# Patient Record
Sex: Male | Born: 1935 | Race: White | Hispanic: No | Smoking: Former smoker
Health system: Southern US, Community
[De-identification: ages and names within clinical notes are randomized; demographics above are authoritative.]

## PROBLEM LIST (undated history)

## (undated) DIAGNOSIS — M1990 Unspecified osteoarthritis, unspecified site: Secondary | ICD-10-CM

## (undated) DIAGNOSIS — N390 Urinary tract infection, site not specified: Secondary | ICD-10-CM

## (undated) DIAGNOSIS — M545 Low back pain, unspecified: Secondary | ICD-10-CM

## (undated) DIAGNOSIS — Z9289 Personal history of other medical treatment: Secondary | ICD-10-CM

## (undated) DIAGNOSIS — I10 Essential (primary) hypertension: Secondary | ICD-10-CM

## (undated) DIAGNOSIS — E7800 Pure hypercholesterolemia, unspecified: Secondary | ICD-10-CM

## (undated) DIAGNOSIS — I499 Cardiac arrhythmia, unspecified: Secondary | ICD-10-CM

## (undated) DIAGNOSIS — I350 Nonrheumatic aortic (valve) stenosis: Secondary | ICD-10-CM

## (undated) DIAGNOSIS — J449 Chronic obstructive pulmonary disease, unspecified: Secondary | ICD-10-CM

## (undated) DIAGNOSIS — I6529 Occlusion and stenosis of unspecified carotid artery: Secondary | ICD-10-CM

## (undated) DIAGNOSIS — E119 Type 2 diabetes mellitus without complications: Secondary | ICD-10-CM

## (undated) DIAGNOSIS — I5033 Acute on chronic diastolic (congestive) heart failure: Secondary | ICD-10-CM

## (undated) DIAGNOSIS — I482 Chronic atrial fibrillation, unspecified: Secondary | ICD-10-CM

## (undated) DIAGNOSIS — M549 Dorsalgia, unspecified: Secondary | ICD-10-CM

## (undated) DIAGNOSIS — I639 Cerebral infarction, unspecified: Secondary | ICD-10-CM

## (undated) DIAGNOSIS — G8929 Other chronic pain: Secondary | ICD-10-CM

## (undated) DIAGNOSIS — G459 Transient cerebral ischemic attack, unspecified: Secondary | ICD-10-CM

## (undated) DIAGNOSIS — F419 Anxiety disorder, unspecified: Secondary | ICD-10-CM

## (undated) DIAGNOSIS — J189 Pneumonia, unspecified organism: Secondary | ICD-10-CM

## (undated) HISTORY — DX: Personal history of other medical treatment: Z92.89

## (undated) HISTORY — PX: KNEE ARTHROSCOPY: SHX127

## (undated) HISTORY — PX: TOTAL HIP ARTHROPLASTY: SHX124

## (undated) HISTORY — PX: EYE SURGERY: SHX253

## (undated) HISTORY — DX: Pure hypercholesterolemia, unspecified: E78.00

## (undated) HISTORY — PX: TONSILLECTOMY: SUR1361

## (undated) HISTORY — DX: Nonrheumatic aortic (valve) stenosis: I35.0

## (undated) HISTORY — PX: APPENDECTOMY: SHX54

## (undated) HISTORY — DX: Other chronic pain: G89.29

## (undated) HISTORY — DX: Dorsalgia, unspecified: M54.9

## (undated) HISTORY — PX: LUMBAR LAMINECTOMY/DECOMPRESSION MICRODISCECTOMY: SHX5026

## (undated) HISTORY — DX: Unspecified osteoarthritis, unspecified site: M19.90

## (undated) HISTORY — DX: Cerebral infarction, unspecified: I63.9

## (undated) HISTORY — PX: CATARACT EXTRACTION W/ INTRAOCULAR LENS  IMPLANT, BILATERAL: SHX1307

## (undated) HISTORY — PX: REPLACEMENT TOTAL KNEE: SUR1224

## (undated) HISTORY — DX: Essential (primary) hypertension: I10

## (undated) HISTORY — PX: BACK SURGERY: SHX140

## (undated) HISTORY — PX: JOINT REPLACEMENT: SHX530

---

## 2000-06-29 ENCOUNTER — Encounter: Admission: RE | Admit: 2000-06-29 | Discharge: 2000-06-29 | Payer: Self-pay | Admitting: Orthopaedic Surgery

## 2000-06-29 ENCOUNTER — Encounter: Payer: Self-pay | Admitting: Orthopaedic Surgery

## 2000-07-03 ENCOUNTER — Ambulatory Visit (HOSPITAL_BASED_OUTPATIENT_CLINIC_OR_DEPARTMENT_OTHER): Admission: RE | Admit: 2000-07-03 | Discharge: 2000-07-03 | Payer: Self-pay | Admitting: Orthopaedic Surgery

## 2005-01-16 ENCOUNTER — Encounter: Admission: RE | Admit: 2005-01-16 | Discharge: 2005-01-16 | Payer: Self-pay | Admitting: Internal Medicine

## 2005-02-20 ENCOUNTER — Encounter: Admission: RE | Admit: 2005-02-20 | Discharge: 2005-02-20 | Payer: Self-pay | Admitting: Internal Medicine

## 2007-06-14 DIAGNOSIS — G459 Transient cerebral ischemic attack, unspecified: Secondary | ICD-10-CM

## 2007-06-14 HISTORY — DX: Transient cerebral ischemic attack, unspecified: G45.9

## 2007-06-15 ENCOUNTER — Inpatient Hospital Stay (HOSPITAL_COMMUNITY): Admission: EM | Admit: 2007-06-15 | Discharge: 2007-06-18 | Payer: Self-pay | Admitting: Emergency Medicine

## 2007-06-18 ENCOUNTER — Encounter (INDEPENDENT_AMBULATORY_CARE_PROVIDER_SITE_OTHER): Payer: Self-pay | Admitting: Interventional Cardiology

## 2007-09-02 ENCOUNTER — Inpatient Hospital Stay (HOSPITAL_COMMUNITY): Admission: RE | Admit: 2007-09-02 | Discharge: 2007-09-03 | Payer: Self-pay | Admitting: Neurosurgery

## 2008-06-26 ENCOUNTER — Encounter: Admission: RE | Admit: 2008-06-26 | Discharge: 2008-06-26 | Payer: Self-pay | Admitting: Geriatric Medicine

## 2010-04-13 ENCOUNTER — Ambulatory Visit (INDEPENDENT_AMBULATORY_CARE_PROVIDER_SITE_OTHER): Payer: Medicare Other | Admitting: Cardiovascular Disease

## 2010-04-13 ENCOUNTER — Encounter: Payer: Self-pay | Admitting: Cardiovascular Disease

## 2010-04-13 DIAGNOSIS — I4891 Unspecified atrial fibrillation: Secondary | ICD-10-CM

## 2010-04-13 DIAGNOSIS — I4821 Permanent atrial fibrillation: Secondary | ICD-10-CM | POA: Insufficient documentation

## 2010-04-26 ENCOUNTER — Telehealth (HOSPITAL_COMMUNITY): Payer: Self-pay | Admitting: Radiology

## 2010-04-27 ENCOUNTER — Encounter: Payer: Self-pay | Admitting: Cardiovascular Disease

## 2010-04-27 ENCOUNTER — Ambulatory Visit (HOSPITAL_COMMUNITY): Payer: Medicare Other | Attending: Cardiovascular Disease

## 2010-04-27 ENCOUNTER — Ambulatory Visit (HOSPITAL_BASED_OUTPATIENT_CLINIC_OR_DEPARTMENT_OTHER): Payer: Medicare Other

## 2010-04-27 ENCOUNTER — Encounter: Payer: Self-pay | Admitting: Cardiology

## 2010-04-27 ENCOUNTER — Other Ambulatory Visit (INDEPENDENT_AMBULATORY_CARE_PROVIDER_SITE_OTHER): Payer: Medicare Other

## 2010-04-27 ENCOUNTER — Other Ambulatory Visit: Payer: Self-pay | Admitting: Cardiovascular Disease

## 2010-04-27 ENCOUNTER — Encounter (INDEPENDENT_AMBULATORY_CARE_PROVIDER_SITE_OTHER): Payer: Self-pay | Admitting: *Deleted

## 2010-04-27 ENCOUNTER — Encounter: Payer: Self-pay | Admitting: *Deleted

## 2010-04-27 DIAGNOSIS — I4891 Unspecified atrial fibrillation: Secondary | ICD-10-CM

## 2010-04-27 DIAGNOSIS — R0989 Other specified symptoms and signs involving the circulatory and respiratory systems: Secondary | ICD-10-CM

## 2010-04-27 DIAGNOSIS — R0609 Other forms of dyspnea: Secondary | ICD-10-CM

## 2010-04-27 DIAGNOSIS — R9431 Abnormal electrocardiogram [ECG] [EKG]: Secondary | ICD-10-CM

## 2010-04-27 LAB — CBC WITH DIFFERENTIAL/PLATELET
Basophils Absolute: 0 10*3/uL (ref 0.0–0.1)
Eosinophils Absolute: 0.1 10*3/uL (ref 0.0–0.7)
Hemoglobin: 15 g/dL (ref 13.0–17.0)
Lymphocytes Relative: 24.7 % (ref 12.0–46.0)
MCHC: 34.3 g/dL (ref 30.0–36.0)
MCV: 90 fl (ref 78.0–100.0)
Monocytes Absolute: 0.5 10*3/uL (ref 0.1–1.0)
Neutro Abs: 4 10*3/uL (ref 1.4–7.7)
RDW: 13.8 % (ref 11.5–14.6)

## 2010-05-03 NOTE — Assessment & Plan Note (Signed)
Summary: SOB   Visit Type:  Initial Consult Referring Provider:  Dr. Earl Gala Primary Provider:  Theressa Millard, M.D.  CC:  new patient referred from Dr. Earl Gala.  History of Present Illness: 77 year-old male presents for initial evaluation of exertional dyspnea and palpitations.    The patient reports progressive shortness of breath over 6-8 months. He normally enjoys walking in the woods, but has been unable to do that recently because of dysnpea. He denies chest pain or tightness. No edema, orthopnea, or PND.   He has also noticed irregular heartbeats of late. He does not have palpitations but has noticed this when checking his pulse. He denies lightheadedness or syncope.  He was noted to have atrial fibrillation when recently seen by Dr. Earl Gala and was referred here for further evaluation.  He has no past history of cardiac disease.    Current Medications (verified): 1)  Plavix 75 Mg Tabs (Clopidogrel Bisulfate) .... Take One Tablet Once Daily 2)  Glimepiride 2 Mg Tabs (Glimepiride) .... One Tablet Once Daily 3)  Lisinopril 20 Mg Tabs (Lisinopril) .... Take One Tablet Once Daily 4)  Metformin Hcl 500 Mg Tabs (Metformin Hcl) .... Take 2 Tablets in The Morning and 2 Tablets in The Evening 5)  Pravastatin Sodium 40 Mg Tabs (Pravastatin Sodium) .... Take One Tablet Once Daily 6)  Cinnamon Oil  Oil (Cassia Oil) .... Take A Total of 4000 Units Daily  Allergies (verified): No Known Drug Allergies  Past History:  Past medical, surgical, family and social histories (including risk factors) reviewed, and no changes noted (except as noted below).  Past Medical History: Hypertension Hypercholesterolemia CVA Osteoarthritis DM type 2 Chronic back pain  Past Surgical History: Reviewed history from 04/12/2010 and no changes required.  Decompressive laminectomy  Left knee surgery  Family History: Reviewed history from 04/12/2010 and no changes required.  No coronary artery  disease.    Mother 69 Father died at 21 No CAD in family  Social History: Reviewed history from 04/12/2010 and no changes required. He does not smoke, drink, or use any drugs He is retired from work in [employer]  Review of Systems       Negative except as per HPI   Vital Signs:  Patient profile:   77 year old male Height:      73 inches Weight:      231 pounds BMI:     30.59 Pulse rate:   76 / minute Pulse rhythm:   irregular Resp:     18 per minute BP sitting:   142 / 86  (left arm) Cuff size:   regular  Vitals Entered By: Judithe Modest CMA (April 13, 2010 2:19 PM)  Physical Exam  General:  Pt is well-developed, alert and oriented, no acute distress HEENT: normal Neck: no thyromegaly           JVP normal, carotid upstrokes normal without bruits Lungs: CTA Chest: equal expansion  CV: Apical impulse nondisplaced, irregularly irregular without murmur or gallop Abd: soft, NT, positive BS, no HSM, no bruit Back: no CVA tenderness Ext: no clubbing, cyanosis, or edema        femoral pulses 2+ without bruits        pedal pulses 2+ and equal Skin: warm, dry, no rash Neuro: CNII-XII intact,strength 5/5 = b/l    EKG  Procedure date:  04/13/2010  Findings:      Atrial fibrillation 76 beats per minute, left anterior fascicular block, otherwise within normal limits.  Impression & Recommendations:  Problem # 1:  ATRIAL FIBRILLATION (ICD-427.31) The patient has newly diagnosed atrial fibrillation. His heart rate is controlled. He does have progressive shortness of breath and this may be related to his atrial fib. I have recommended a 2-D echocardiogram as well as a exercise Myoview stress test to rule out significant ischemic heart disease. I reviewed the patient's old records and he did have a transesophageal echocardiogram in 2009 at the time of a TIA. This demonstrated severe aortic atheroma and a mildly calcified aortic valve. There otherwise was no  significant structural heart disease.  The patient clearly warrants anticoagulation as his chads score is 5 (history TIA, hypertension, age, and diabetes).  We reviewed anticoagulants including warfarin and pradaxa and have elected a trial of pradaxa. He should discontinue plavix since we are starting systemic anticoagulation.  I would like to see him back in followup after his echocardiogram and stress test are completed.   His updated medication list for this problem includes:    Plavix 75 Mg Tabs (Clopidogrel bisulfate) .Marland Kitchen... Take one tablet once daily  Orders: EKG w/ Interpretation (93000) Echocardiogram (Echo) Nuclear Stress Test (Nuc Stress Test)  Patient Instructions: 1)  Your physician recommends that you schedule a follow-up appointment in: APRIL 2)  Your physician has recommended you make the following change in your medication: START Pradaxa 150mg  take one by mouth two times a day  3)  Your physician has requested that you have an echocardiogram.  Echocardiography is a painless test that uses sound waves to create images of your heart. It provides your doctor with information about the size and shape of your heart and how well your heart's chambers and valves are working.  This procedure takes approximately one hour. There are no restrictions for this procedure. 4)  Your physician has requested that you have an exercise stress myoview.  For further information please visit [email or web address]  Please follow instruction sheet, as given. 5)  Your physician recommends that you return for lab work in: 10 Days (CBC 427.31) Prescriptions: PRADAXA 150 MG CAPS (DABIGATRAN ETEXILATE MESYLATE) take one by mouth two times a day  #60 x 6   Entered by:   Julieta Gutting, RN, BSN   Authorized by:   Norva Karvonen, MD   Signed by:   Julieta Gutting, RN, BSN on 04/13/2010   Method used:   Electronically to        [id number] [phone]* (retail)       [address]        [place], [place]  [address]       Ph: [phone] or [phone]       Fax: [phone]   RxID:   [id number]   Appended Document: SOB [name] - I was reviewing his note and he should stop plavix now that we are starting him on pradaxa. (Per Dr Excell Seltzer)  Pt made aware of these instructions today during office testing.   Clinical Lists Changes  Medications: Removed medication of PLAVIX 75 MG TABS (CLOPIDOGREL BISULFATE) take one tablet once daily

## 2010-05-03 NOTE — Progress Notes (Signed)
Summary: Nuclear Pre-Procedure  Phone Note Outgoing Call Call back at [facility] Phone [phone]   Call placed by: Stanton Kidney, EMT-P,  April 26, 2010 2:30 PM Call placed to: Patient Action Taken: Phone Call Completed Reason for Call: Confirm/change Appt Summary of Call: Reviewed information on Myoview Information Sheet (see scanned document for further details).  Spoke with the patient. Stanton Kidney, EMT-P  April 26, 2010 2:30 PM      Nuclear Med Background Indications for Stress Test: Evaluation for Ischemia   History: Abnormal EKG, Echo  History Comments: LAFB; "09 Echo / TEE severe aortic atheroma; A-Fib- new diag.  Symptoms: DOE, Palpitations, SOB    Nuclear Pre-Procedure Cardiac Risk Factors: CVA, Hypertension, Lipids, NIDDM, TIA Height (in): 73

## 2010-05-12 ENCOUNTER — Encounter: Payer: Self-pay | Admitting: Cardiovascular Disease

## 2010-05-12 NOTE — Assessment & Plan Note (Signed)
Summary: Cardiology Nuclear Testing  Nuclear Med Background Indications for Stress Test: Evaluation for Ischemia   History: Abnormal EKG, Echo  History Comments: LAFB; "09 Echo / TEE severe aortic atheroma; A-Fib- new diag.  Symptoms: DOE, Palpitations, SOB    Nuclear Pre-Procedure Cardiac Risk Factors: CVA, Hypertension, Lipids, NIDDM, TIA Caffeine/Decaff Intake: None NPO After: 6:30 PM Lungs: clear IV 0.9% NS with Angio Cath: 20g     IV Site: R Antecubital IV Started by: Stanton Kidney, EMT-P Chest Size (in) 44     Height (in): 72 Weight (lb): 228 BMI: 31.03  Nuclear Med Study 1 or 2 day study:  1 day     Stress Test Type:  Eugenie Birks Reading MD:  Cassell Clement, MD     Referring MD:  M.Cooper Resting Radionuclide:  Technetium 53m Tetrofosmin     Resting Radionuclide Dose:  11 mCi  Stress Radionuclide:  Technetium 35m Tetrofosmin     Stress Radionuclide Dose:  33 mCi   Stress Protocol  Max Systolic BP: 159 mm Hg Lexiscan: 0.4 mg   Stress Test Technologist:  Milana Na, EMT-P     Nuclear Technologist:  Doyne Keel, CNMT  Rest Procedure  Myocardial perfusion imaging was performed at rest 45 minutes following the intravenous administration of Technetium 22m Tetrofosmin.  Stress Procedure  The patient received IV Lexiscan 0.4 mg over 15-seconds.  Technetium 27m Tetrofosmin injected at 30-seconds.  There were no significant changes with infusion.  Quantitative spect images were obtained after a 45 minute delay.  QPS Raw Data Images:  Normal; no motion artifact; normal heart/lung ratio. Stress Images:  Normal homogeneous uptake in all areas of the myocardium. Rest Images:  Normal homogeneous uptake in all areas of the myocardium. Subtraction (SDS):  Normal Transient Ischemic Dilatation:  1.13  (Normal <1.22)  Lung/Heart Ratio:  0.38  (Normal <0.45)  Quantitative Gated Spect Images QGS cine images:  non-gated study  Findings Low risk nuclear  study      Overall Impression  Exercise Capacity: Lexiscan with no exercise. BP Response: Normal blood pressure response. Clinical Symptoms: No chest pain ECG Impression: No significant ST segment change suggestive of ischemia.  Atrial fibrillation. Overall Impression: Low risk stress nuclear study.  Non-gated study because of arrhythmia  Appended Document: Cardiology Nuclear Testing no ischemia. no further ischemic eval needed.  Appended Document: Cardiology Nuclear Testing Pt aware of results by phone.

## 2010-05-16 ENCOUNTER — Telehealth: Payer: Self-pay | Admitting: Cardiovascular Disease

## 2010-05-16 DIAGNOSIS — G459 Transient cerebral ischemic attack, unspecified: Secondary | ICD-10-CM

## 2010-05-16 DIAGNOSIS — I4891 Unspecified atrial fibrillation: Secondary | ICD-10-CM

## 2010-05-16 NOTE — Telephone Encounter (Signed)
Left message for pt to call the office in regards to Pradaxa.

## 2010-05-16 NOTE — Telephone Encounter (Signed)
I spoke with the pt and he said he had a "mini-stroke" on Friday.  The pt self diagnosed himself and did not seek medical treatment.  The pt said he has had 2 mini-strokes in the past and this was a similar episode.  The symptoms lasted a few minutes and he could not put words together. The pt's plavix was stopped after the pt was started on pradaxa. The pt is questioning if he should restart Plavix.  The pt has not missed any doses of Pradaxa. I will speak with Dr Excell Seltzer about the pt's concerns.  Cell [phone]

## 2010-05-16 NOTE — Telephone Encounter (Signed)
Pt has question re his plavix.

## 2010-05-17 ENCOUNTER — Other Ambulatory Visit: Payer: Self-pay | Admitting: *Deleted

## 2010-05-17 MED ORDER — DABIGATRAN ETEXILATE MESYLATE 150 MG PO CAPS: 150.0000 mg | ORAL_CAPSULE | Freq: Two times a day (BID) | ORAL | Status: DC

## 2010-05-17 NOTE — Telephone Encounter (Signed)
I left a message on the patient's voicemail to start back on aspirin 81 mg daily. He should continue with Pradaxa. I asked him to call our office back tomorrow for further instructions. We would like to check a carotid duplex exam to rule out significant carotid stenosis.

## 2010-05-18 NOTE — Telephone Encounter (Signed)
Left message for pt to call back.  24 days worth of Pradaxa samples placed at the front desk for pick-up.  The pt has been scheduled for carotid duplex on 06/03/10 at 11:30.

## 2010-05-18 NOTE — Telephone Encounter (Signed)
I spoke with the patient this morning. He had a transient episode of aphasia last week and is feeling fine now. I've asked him to continue pradaxa and to start aspirin 81 mg daily. He has just undergone an echocardiogram and stress test. Will add a carotid duplex scan to rule out severe carotid stenosis. I think the most likely etiology of his TIA is cardioembolic from atrial fibrillation. The patient only has 5 pradaxa pills left.  We will call in a prescription to his local pharmacy and see if we can get him some samples as well.

## 2010-05-18 NOTE — Telephone Encounter (Signed)
Please call pt back at [phone]

## 2010-05-18 NOTE — Telephone Encounter (Signed)
Pt aware of samples and carotid duplex appointment.

## 2010-06-02 ENCOUNTER — Ambulatory Visit: Payer: Medicare Other | Admitting: Cardiovascular Disease

## 2010-06-03 ENCOUNTER — Encounter: Payer: Self-pay | Admitting: Cardiovascular Disease

## 2010-06-03 ENCOUNTER — Encounter (INDEPENDENT_AMBULATORY_CARE_PROVIDER_SITE_OTHER): Payer: Medicare Other | Admitting: *Deleted

## 2010-06-03 ENCOUNTER — Other Ambulatory Visit: Payer: Self-pay | Admitting: *Deleted

## 2010-06-03 DIAGNOSIS — G459 Transient cerebral ischemic attack, unspecified: Secondary | ICD-10-CM

## 2010-06-03 DIAGNOSIS — I6529 Occlusion and stenosis of unspecified carotid artery: Secondary | ICD-10-CM

## 2010-06-03 DIAGNOSIS — I4891 Unspecified atrial fibrillation: Secondary | ICD-10-CM

## 2010-06-07 ENCOUNTER — Ambulatory Visit (INDEPENDENT_AMBULATORY_CARE_PROVIDER_SITE_OTHER): Payer: Medicare Other | Admitting: Cardiovascular Disease

## 2010-06-07 ENCOUNTER — Encounter: Payer: Self-pay | Admitting: Cardiovascular Disease

## 2010-06-07 VITALS — BP 144/86 | HR 87 | Ht 73.0 in | Wt 232.1 lb

## 2010-06-07 DIAGNOSIS — I4891 Unspecified atrial fibrillation: Secondary | ICD-10-CM

## 2010-06-07 DIAGNOSIS — I63239 Cerebral infarction due to unspecified occlusion or stenosis of unspecified carotid arteries: Secondary | ICD-10-CM

## 2010-06-07 NOTE — Patient Instructions (Signed)
Your physician has requested that you have a carotid duplex in 6 MONTHS. This test is an ultrasound of the carotid arteries in your neck. It looks at blood flow through these arteries that supply the brain with blood. Allow one hour for this exam. There are no restrictions or special instructions.  Your physician wants you to follow-up in: 6 MONTHS.  You will receive a reminder letter in the mail two months in advance. If you don't receive a letter, please call our office to schedule the follow-up appointment.  Your physician recommends that you continue on your current medications as directed. Please refer to the Current Medication list given to you today.

## 2010-06-07 NOTE — Progress Notes (Signed)
HPI:  This is an 77 year old gentleman presenting for followup evaluation. He was last seen in February. He has been diagnosed with atrial fibrillation with well-controlled heart rate in the absence of any AV nodal blockers. The patient has a history of TIA and at the time of his initial event he underwent a thorough evaluation including transesophageal echo. After he was first seen in February he was started on Pradaxa. He had been on Plavix prior to that and this was discontinued when pradaxa was started. The patient reported an episode a few weeks back when he developed expressive aphasia. This lasted only a few minutes and then resolved on its own. He did not seek medical attention at that time. He was started back on aspirin 81 mg daily in addition to his pradaxa and he has had no recurrent episodes. He had recently undergone an echocardiogram and stress test in our office and they demonstrated no significant abnormalities. We arranged a carotid duplex scan which was done April 20 and it showed 60-79% bilateral ICA stenosis with velocities consistent with moderate carotid disease.  He otherwise reports no changes in his baseline symptoms. The patient has exertional dyspnea which is unchanged. He denies edema, palpitations, lightheadedness, syncope, or chest pain.  Outpatient Encounter Prescriptions as of 06/07/2010  Medication Sig Dispense Refill  . aspirin 81 MG EC tablet Take 81 mg by mouth daily.        . dabigatran (PRADAXA) 150 MG CAPS Take 1 capsule (150 mg total) by mouth every 12 (twelve) hours.  60 capsule  6  . glimepiride (AMARYL) 2 MG tablet Take 2 mg by mouth daily before breakfast.        . lisinopril (PRINIVIL,ZESTRIL) 20 MG tablet Take 20 mg by mouth daily.        . metFORMIN (GLUCOPHAGE-XR) 500 MG 24 hr tablet Take 1,000 mg by mouth 2 (two) times daily.        . pravastatin (PRAVACHOL) 40 MG tablet Take 40 mg by mouth daily.        Marland Kitchen DISCONTD: Cinnamon Oil OIL Take a total of 4000  units daily         No Known Allergies  Past Medical History  Diagnosis Date  . Hypertension   . Hypercholesterolemia   . Stroke   . Osteoarthritis   . Diabetes mellitus     typeII  . Chronic back pain     ROS: Negative except as per HPI  BP 144/86  Pulse 87  Ht 6\' 1"  (1.854 m)  Wt 232 lb 1.9 oz (105.289 kg)  BMI 30.62 kg/m2  PHYSICAL EXAM: Pt is alert and oriented, elderly male in NAD HEENT: normal Neck: JVP - normal, carotids 2+= without bruits Lungs: CTA bilaterally CV:  Irregular without murmur or gallop Abd: soft, NT, Positive BS, no hepatomegaly Ext: no C/C/E, distal pulses intact and equal Skin: warm/dry no rash  ASSESSMENT AND PLAN:

## 2010-06-13 ENCOUNTER — Encounter: Payer: Self-pay | Admitting: Cardiovascular Disease

## 2010-06-15 DIAGNOSIS — I63239 Cerebral infarction due to unspecified occlusion or stenosis of unspecified carotid arteries: Secondary | ICD-10-CM | POA: Insufficient documentation

## 2010-06-15 NOTE — Assessment & Plan Note (Signed)
The patient appears stable. He has had no further TIA symptoms since the addition of low-dose aspirin. We were able to get pradaxa approved for him after an appeal to his insurance company. I have asked him to continue on both drugs.  His echocardiogram and nuclear study for both reviewed. The nuclear study was low risk with no significant ischemia. The echocardiogram showed mildly reduced systolic function with an estimated ejection fraction of 45-50%. There were no regional wall motion abnormalities. The right heart chambers were mildly dilated and the pulmonary pressure was mildly increased (estimated PA pressure 43 mm mercury).

## 2010-06-15 NOTE — Assessment & Plan Note (Signed)
The patient is on antiplatelet therapy with aspirin. He should have a followup carotid duplex scan in 6 months. If he has recurrent TIA symptoms I think we will need to consider referral to vascular surgery for further assessment of his carotid disease. Typically endarterectomy would be deferred until the severity of carotid stenosis is greater than 80% but if recurrent symptoms on good medical therapy occur then surgery might be considered with 60-80% stenosis.

## 2010-06-28 NOTE — Op Note (Signed)
Frank Marks, Frank Marks               ACCOUNT NO.:  [id number]   MEDICAL RECORD NO.:  [id number]          PATIENT TYPE:  INP   LOCATION:  3012                         FACILITY:  [facility]   PHYSICIAN:  Clydene Fake, M.D.  DATE OF BIRTH:  Aug 08, 1926   DATE OF PROCEDURE:  09/02/2007  DATE OF DISCHARGE:                               OPERATIVE REPORT   PREOPERATIVE DIAGNOSES:  Lumbar stenosis, spondylosis, herniated nucleus  pulposus, L2-L5.   POSTOPERATIVE DIAGNOSIS:  Lumbar stenosis, spondylosis, herniated  nucleus pulposus, L2-L5.   PROCEDURE:  Decompressive laminectomy decompressing the L2, L3, L4, and  L5 roots (4 levels) with diskectomy left L3, L4, L5, and microdissection  with microscope.   SURGEON:  Clydene Fake, MD   ASSISTANT:  Hilda Lias, MD   ANESTHESIA:  General endotracheal tube anesthesia.   ESTIMATED BLOOD LOSS:  150 mL.   BLOOD GIVEN:  None.   DRAINS:  None.   COMPLICATIONS:  None.   REASON FOR PROCEDURE:  The patient is a 77 year old gentleman who has  been having back, left leg pain, and trouble walking.  Exam showed  posterior leg raise and decreased sensation in left L4-L5 roots.  MRI  showed severe stenosis at L2-L3, L3-L4, and L4-L5, disk herniation at  left side of L3-L4, and the patient brought in for a decompression.   PROCEDURE IN DETAIL:  The patient was brought to the operating room and  general anesthesia was induced.  The patient was placed in the prone  position on a Wilson frame with all pressure points well padded.  The  patient was prepped and draped in the sterile fashion.  General  anesthesia injected with 20 mL of 1% lidocaine with epinephrine.  Incision was then made in midline of lumbar spine.  Incision was taken  down to the fascia.  Hemostasis was obtained with Bovie cauterization.  On the left side, subperiosteal dissection was done at the L3-L4, L4-L5  spinous processes, lamina to the facets.  Markers were placed at L3-L4  and L4-L5 interspaces.  X-rays were obtained confirming our positioning.  Exposure was then continued to expose the L2-L3 space and also exposing  the right side.  Between the fascia, we could see the L2 spinous process  with lamina down to the L5 spinous process with lamina and lateral to  the facets.  Self-retaining retractors were placed and decompressive  laminectomy was then started with Leksell rongeurs, high-speed drill,  and Kerrison punches.  We removed lamina of L2 down and top of L5  decompressing the central canal and the bilateral nerve roots of L2, L3,  L4, and L5.  There was hypertrophic ligament in the gutters at each  level, worse at L3-L4 and this was removed with Kerrison punches and  with curettes.  When we were finished, we had good decompression of the  central canal and the lateral gutters.  We explored disk spaces and L3-  L4 on the left side.  Free fragments of disk were found in front of the  thecal sac and the L4 roots.  Microscope was brought in for  microdissection.  At this point, we carefully dissected the dura off the  disk space.  Using 3 guidewire we were able to remove the herniated disk  material.  We did not go into the disk space.  Then we explored the  nerve roots and central canal.  We had good decompression.  We irrigated  with antibiotic solution.  We had hemostasis with Gelfoam and thrombin.  This was irrigated out.  A small piece of Gelfoam was left over the left  side of L3-L4 area.  Retractors were removed.  Hemostasis was obtained  of paraspinous muscles with Bovie cauterization.  We irrigated with  antibiotic solution again and had good hemostasis and the fascia closed  with 0 Vicryl interrupted suture.  Subcutaneous tissue closed with 0, 2-  0, and 3-0 Vicryl interrupted sutures.  Skin closed with benzoin and  Steri-Strips.  Dressing was placed.  The patient was placed back in a  supine position, awakened from anesthesia, and transferred to  recovery  room in stable condition.           ______________________________  Clydene Fake, M.D.     JRH/MEDQ  D:  09/02/2007  T:  09/03/2007  Job:  [id number]

## 2010-06-28 NOTE — Consult Note (Signed)
NAME:  Frank Marks, Frank Marks               ACCOUNT NO.:  [id number]   MEDICAL RECORD NO.:  [id number]          PATIENT TYPE:  INP   LOCATION:  3003                         FACILITY:  [facility]   PHYSICIAN:  Corky Crafts, MDDATE OF BIRTH:  22-Jan-1927   DATE OF CONSULTATION:  06/17/2007  DATE OF DISCHARGE:                                 CONSULTATION   REFERRING PHYSICIAN:  Theressa Millard, M.D.   REASON FOR VISIT:  1. CVA.  2. Diabetes.  3. High cholesterol.   HISTORY OF PRESENT ILLNESS:  The patient is a 77 year old man with no  prior cardiac history.  He woke up on Jun 15, 2007, with right arm and  leg weakness.  His symptoms resolved after about 30 minutes.  He had a  small acute infarct in the right temporal lobe by MRI.  There was also  an abnormal signal in the left pons which could have been artifact.  His  symptoms have resolved.  He has not had any chest pain or shortness of  breath or palpitations.  Currently, he is feeling well.  We were asked  to see the patient to evaluate him for a possible TTE.   PAST MEDICAL HISTORY:  1. Diabetes.  2. Osteoarthritis.  3. Sciatica.   SOCIAL HISTORY:  He does not smoke, drink, or use any drugs.   FAMILY HISTORY:  No coronary artery disease.   ALLERGIES:  No known drug allergies.   MEDICATIONS:  In the hospital he is on;  1. Plavix 75 mg a day.  2. Actos 30 mg a day.  3. Zocor 20 mg a day.   REVIEW OF SYSTEMS:  Significant for the transient weakness.  No  swallowing problems.  No palpitations.  No shortness of breath.  He does  have joint pain.  All other systems are negative.   PHYSICAL EXAMINATION:  VITAL SIGNS:  Blood pressure 125/69 and pulse 76.  HEAD:  Normocephalic and atraumatic.  EYES:  Extraocular movements are intact.  NECK:  No JVD.  No carotid bruits.  CARDIOVASCULAR:  Regular rate and rhythm.  S1, S2.  LUNGS:  Clear to auscultation bilaterally.  MOUTH:  Dentition okay.  No obvious loose teeth.  ABDOMEN:  Soft and  nontender.  EXTREMITIES:  Showed no edema.  Palpable dorsalis pedis pulses.  NEUROLOGICAL:  No gross focal motor or sensory deficits.  SKIN:  No rash.  PSYCHE:  Normal in affect.   LAB WORK:  Shows hematocrit of 43.9, creatinine 1.5, LDL 152, troponin  negative x1.  EKG shows normal sinus rhythm.  Poor R wave progression.  No acute ST-T  wave changes.  Left axis deviation.   MEDICAL DECISION MAKING:  A 77 year old with recent TIA who also has  high cholesterol and diabetes.   PLAN:  1. We want to check on his transthoracic echo today.  If there are no      obvious abnormalities or causes for stoke, I will plan for TEE      tomorrow.  The risks and benefits were explained to the patient.   1. I agree with  continuing on Plavix.  Given his stokes, I would also      consider using aspirin, he is given dual antiplatelet therapy, if      this is acceptable.   1. Aggressive diabetes control.   1. I would also consider performing an outpatient coronary artery      disease evaluation on the patient.      Corky Crafts, MD  Electronically Signed     JSV/MEDQ  D:  06/17/2007  T:  06/18/2007  Job:  [id number]

## 2010-06-28 NOTE — Consult Note (Signed)
NAME:  Frank Marks, Frank Marks               ACCOUNT NO.:  [id number]   MEDICAL RECORD NO.:  [id number]          PATIENT TYPE:  INP   LOCATION:  3003                         FACILITY:  [facility]   PHYSICIAN:  Melvyn Novas, M.D.  DATE OF BIRTH:  08/17/1926   DATE OF CONSULTATION:  DATE OF DISCHARGE:                                 CONSULTATION   REASON FOR CONSULTATION:  This is a 77 year old Caucasian right-handed  male who presented on Jun 15, 2007, at 8 in the morning with a history of  right-sided numbness and weakness.  He reports his right arm and leg  feeling heavy, but he denies having ever been at risk of falling or  having disequilibrium or any symptoms of dizziness, nausea, vomiting, or  pain with it.  The patient stated the onset preceded his presentation to  the ER by more than 8 hours.  His girlfriend had to convince him to come  to the ER.  He does have a history of a previous possible TIA,  manifesting as right-sided weakness in September of last year, and at  that time was treated at [facility].  He had been on aspirin during the time of his first spell.  He states was switched to Plavix.   Dr. Jamison Neighbor H&P states that the patient did not undergo a 2D  echocardiogram or cannot recall having had one due to in the patient's  word falling into the doughnut hole.  He was changed back to aspirin,  now he had his second fairly similar spell on aspirin.   The Stroke consult team was called, because the patient's MRI showed a  right temporal small ischemic lesion, and there is a very unusually  shaped left-sided pontine lesion noted with a small halo around it.  MRI  did not show significant vascular disease in the last fall, and  according to the patient, he had carotid Dopplers, and he was verbally  told that they looked fine.   The patient has returned after each of these spells to his normal  neurological status.   FAMILY HISTORY:  Positive for hypertension and  dyslipidemia.   SOCIAL HISTORY:  The patient is retired.  His adult son is at the  bedside.  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  The patient lives  with a male companion.  He denies alcohol or nicotine use.   HbA1c was slightly elevated at 6.5.  The patient has clear signs of  dyslipidemia with an HDL of only 30 and LDL far exceed to normal range.  I reviewed the MRI, which clearly shows a ring-enhancing lesion in the  left pons, which was discussed as possibly artifactual.   [name] presents with a blood pressure now of 148/90.  At the time  of his ER evaluation, he was 190/100, temperature is 98 degrees  Fahrenheit, pulse rate is 73 and regular, and respiratory rate is 12-14.  He is in no acute distress, pleasant, cooperative, looks healthy, well-  nourished, well-groomed, alert and oriented x3.  Cranial nerve  examination shows no facial abnormality and no  facial droop or sensory  loss.  Tongue and uvula are midline.  Eye movements are conjugate.  Pupils are bilaterally equal.  Eye closure is equally strong.  Forehead  is not paralyzed.   Neurovascular examination shows deep tendon reflexes to be attenuated  throughout but similarly.  The patient is status post bilateral knee  replacement and has, probably due to this procedure, also attenuated  patellar reflexes.  He has no peripheral clubbing, cyanosis, or edema  and normal capillary refill time.  His sensory to primary modalities is  intact.  Finger-to-nose was without dysmetria, ataxia, or tremor.  The  patient's gait is intact.   ASSESSMENT:  1. I do strongly suspect a transient ischemic attack, which has to      have originated from the left major coronary artery area.  However,      his unusual MRI with presentation of lesions in the exact opposite      brain hemisphere makes me think that this patient might have an      embolic reason for his symptoms.  I agree with a 2D echo to begin      with.  Should this  be not sufficient, I would be aggressive and      pursuing a TEE if needed.  2. Instead of starting the new drug, Aggrenox, I would rather have the      patient return to Plavix since it is now generically available.      Again, he had 2 events while being treated with aspirin only.  3. Risk factor management includes hyperlipidemia management and      hyperglycemia management.  The patient appears physically very fit      and interested in and remaining so.  We have not yet discussed the      dietary measures.  The echocardiogram should be scheduled for tomorrow with [facility].  If the results do not show any evidence of valvular disease  or source of emboli I would go with Plavix and a baby aspirin daily.  If  there is any cardiac structural abnormality we would need to revisit  this case with Dr. Delia Heady and see if Coumadin could be indicated.  At this time, I see no reason to start Coumadin.      Melvyn Novas, M.D.  Electronically Signed     CD/MEDQ  D:  06/16/2007  T:  06/17/2007  Job:  [id number]

## 2010-06-28 NOTE — H&P (Signed)
NAME:  Frank Marks, Frank Marks               ACCOUNT NO.:  [id number]   MEDICAL RECORD NO.:  [id number]          PATIENT TYPE:  EMS   LOCATION:  MAJO                         FACILITY:  [facility]   PHYSICIAN:  Corinna L. Lendell Caprice, MDDATE OF BIRTH:  1926/06/09   DATE OF ADMISSION:  06/15/2007  DATE OF DISCHARGE:                              HISTORY & PHYSICAL   CHIEF COMPLAINT:  Right-sided weakness.   HISTORY OF PRESENT ILLNESS:  Mr. Marti is a 77 year old white male  patient of Dr. Earl Gala who presents with the above complaints.  He woke  up at midnight and noticed that his right side was weak.  He was having  difficulty walking.  His right hand also seemed uncoordinated.  He is  right-handed.  He is on 2 baby aspirins a day.  He was admitted about 9  months ago to [facility] with similar symptoms.  I am told he had  a CT scan of the brain, no MRI at that time or echocardiogram.  I do not  have records from that admission.  He reportedly had slurred speech,  according to his fiancee.  She encouraged him to seek medical attention,  but he refused until this morning.  In fact, he was able to cook some  eggs and bacon at 3 a.m.  He is very resistant to being admitted and  almost left AMA, but now agrees to come in for observation and further  testing.  His symptoms have completely resolved.   PAST MEDICAL HISTORY:  1. Diabetes.  2. TIA, as above.  3. Osteoarthritis.  4. chronic low back pain and sciatica.   MEDICATIONS:  1. Aspirin 162 mg a day.  2. Actos 30 mg a day.  3. Naprosyn.  He has been taking his wife's Naprosyn 500 mg twice a      day on and off, and has been taking at least 6 Aleves daily.  4. He also is on the medication to protect his kidneys.   No known drug allergies.   SOCIAL HISTORY:  He is engaged to be married.  He does not drink, smoke,  or use drugs.   FAMILY HISTORY:  Negative for stroke.   REVIEW OF SYSTEMS:  As above, otherwise negative.   PHYSICAL  EXAMINATION:  VITAL SIGNS:  Temperature is 97.9, blood pressure  190/101, repeat blood pressure was 152/85, pulse 73, respiratory rate  12, oxygen saturation 97% on room air.  GENERAL:  The patient is well nourished, well developed, in no acute  distress.  HEENT: Normocephalic, atraumatic.  Pupils equal, round, reactive to  light.  Extraocular movements are intact.  Moist mucous membranes.  Tongue is midline.  There may be some downturn of the right side of his  mouth when he smiles, but he really has no flattening of the nasal  labial fold, and his wife reports that his face looks unchanged.  NECK:  Supple.  No lymphadenopathy.  No carotid bruits.  LUNGS:  Clear to auscultation bilaterally, without wheezes, rhonchi, or  rales.  CARDIOVASCULAR:  Regular rate and rhythm, without murmurs, gallops, or  rubs.  ABDOMEN:  Normal bowel sounds.  Soft, nontender, nondistended.  GU/RECTAL:  Deferred.  EXTREMITIES:  No clubbing, cyanosis, or edema.  NEUROLOGIC:  The patient is alert and oriented and, other than above,  cranial nerves are probably all intact.  I do not think that he has any  new facial asymmetry.  Motor strength 5/5 throughout.  Deep tendon  reflexes 2+.  Gait is normal.  Romberg negative.  No pronator drift.  Finger-to-nose is normal.  Sensation intact.  Tandem gait normal.  PSYCHIATRIC:  The patient is calm, but somewhat inappropriate at times,  but overall cooperative.   LABORATORIES:  CBC, INR unremarkable.  Basic metabolic panel significant  for a glucose of 134, BUN of 25, creatinine 1.5, otherwise unremarkable.  Point of care enzymes negative.  EKG shows normal sinus rhythm, with a  first-degree AV block, left axis deviation, pulmonary disease pattern.  CT brain shows mild cortical volume loss, with proportional ventricular  prominence, nothing acute.   ASSESSMENT AND PLAN:  1. Transient ischemic attack:  The patient's symptoms have resolved,      but since this is the  second episode within a year despite having      well-controlled diabetes and being on aspirin, I have recommended      23-hour observation, MRI, check fasting lipids and homocystine      level.  I will change his medications to Aggrenox.  He will get      neuro checks.  If he in fact did not have an echocardiogram, this      would need to be done but not on an emergent basis this weekend, so      this could potentially be done as an outpatient.  I particularly      feel that it is important that the patient be admitted for      observation, as if he has a recurrence of his symptoms he may be      unreliable to present back to the emergency room within a timely      fashion.  He will be placed on telemetry.  2. Diabetes:  Check hemoglobin A1c.  The patient reports that his      sugars are well controlled I will continue his Actos.  3. Osteoarthritis.  I have instructed him on proper use of NSAIDs, a.      As it appears that he is taking too much at home.  He has an      appointment with his neurosurgeon next week.  4. Increased BUN.  Suspect slight dehydration..      Corinna L. Lendell Caprice, MD  Electronically Signed     CLS/MEDQ  D:  06/15/2007  T:  06/15/2007  Job:  [id number]   cc:   Theressa Millard, M.D.

## 2010-06-28 NOTE — Discharge Summary (Signed)
NAMEJERAL, ZICK               ACCOUNT NO.:  [id number]   MEDICAL RECORD NO.:  [id number]          PATIENT TYPE:  INP   LOCATION:  3003                         FACILITY:  [facility]   PHYSICIAN:  Theressa Millard, M.D.    DATE OF BIRTH:  08-14-1926   DATE OF ADMISSION:  06/15/2007  DATE OF DISCHARGE:  06/18/2007                               DISCHARGE SUMMARY   ADMITTING DIAGNOSIS:  Transient ischemic attack.   DISCHARGE DIAGNOSES:  1. Transient ischemic attack.  2. Stroke noted on contralateral side.  3. 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  4. Diabetes mellitus.  5. Osteoarthritis.  6. Low back pain and left lumbar radiculopathy.   HISTORY OF PRESENT ILLNESS:  The patient is a 77 year old white male who  presented after having a 30-minute episode of weakness involving the  right leg and the right arm.  By the time he got to the emergency  department, he was asymptomatic.   HOSPITAL COURSE:  The patient was admitted and placed on telemetry.  An  MRI was obtained.  This showed that he had a stroke in the right  temporoparietal lobe.  The patient's findings on MRI and the patient's  symptoms were not consistent with one another.  Therefore, it is felt  that the patient is probably suffering from embolic phenomena.  He  underwent transesophageal echocardiogram and a surface  electrocardiogram, which revealed no abnormality of the heart.  However,  he did have greater than 4 mm of atherosclerosis noted in the aortic  arch.  This was consistent with possible embolic source.  He was treated  with aspirin and Plavix during the hospitalization.  There was some  consideration in switching him to Aggrenox, but for cause purposes, he  remained on generic Plavix and aspirin.  Actos, given to him, prior to  admission was continued.  His A1c was 6.5.   We also obtained a lumbar MRI, as the patient was needing this for  consultation  with Dr. Phoebe Perch.  This revealed the evidence of multilevel  spinal stenosis due to degenerative disease.   DISCHARGE CONDITION:  Improved.   DISCHARGE MEDICATIONS:  1. Actos 30 mg daily.  2. Aspirin 325 mg daily.  3. Plavix 75 mg daily.   FOLLOWUP:  Followup or call to make an appointment to see him  approximately 2 weeks.  At that time, we will discuss with the lowering  agents as an attempt to stabilize plaque.   Consider 3-week monitoring to rule out atrial fibrillation per  Neurology.   ACTIVITIES:  As tolerated.   DIET:  No added salt.      Theressa Millard, M.D.  Electronically Signed     JO/MEDQ  D:  06/18/2007  T:  06/19/2007  Job:  [id number]

## 2010-07-01 NOTE — Op Note (Signed)
[facility]  Patient:    Frank Marks, Frank Marks                  MRN: [id number] Proc. Date: 07/03/00 Adm. Date:  [id number] Attending:  Marcene Corning                           Operative Report  PREOPERATIVE DIAGNOSIS:  Left knee degenerative joint disease.  POSTOPERATIVE DIAGNOSIS:  Left knee degenerative joint disease.  OPERATION: 1. Left knee partial medial and partial lateral meniscectomies. 2. Left knee chondroplasty all three compartments.  SURGEON:  Lubertha Basque. Jerl Santos, M.D.  ASSISTANT:  Prince Rome, P.A.  ANESTHESIA:  Knee block followed by general.  INDICATION FOR PROCEDURE:  The patient is a 77 year old male with a long history of left leg pain.  By x-ray he has significant degenerative change of both the hip and knee.  His greatest problem with the knee is a catch at full extension.  He was fully evaluated and told that he most likely needs knee and hip replacement surgery.  He would like to go for something less invasive at this point, so we discussed an arthroscopy for diagnostic and therapeutic purposes.  The procedure was discussed with the patient and informed operative consent was obtained after discussion of the possible complications of and reaction to anesthesia and infection.  DESCRIPTION OF PROCEDURE:  The patient was taken to the operating suite where knee block anesthetic was applied without difficulty.  He found it hard to relax and general anesthetic ensued.  He was prepped and draped in a normal sterile fashion.  After the administration of preoperative IV antibiotics, an arthroscopy of the left knee was performed through a total of two inferior portals.  Suprapatellar pouch was benign while the patellar femoral joint exhibited grade III change.  A thorough chondroplasty was performed. He did have a large plica which appeared to be stuck in the patellar femoral joint and could be causing the catch towards full  extension.  This was removed.  In the medial compartment, he had grade IV change on most of the medial tibial plateau.  He has some residual medial meniscus which was addressed with a partial medial meniscectomy taken about 15% of the structure which was remaining.  Chondroplasty was performed.  In the lateral quadrant, he had grade III changes on both surfaces and some fraying in the lateral meniscus which required a 5% partial lateral meniscectomy.  The ACL actually appeared intact.  There was some large spurs which were addressed with debridement. The knee was thoroughly irrigated followed by the placement of Marcaine with epinephrine and morphine plus Depo-Medrol.  Adaptic was placed over its portal followed by dry gauze and a loose Ace wrap.  Estimated blood loss and fluids can be obtained from anesthesia records.  DISPOSITION:  The patient was extubated in the operating room and taken to the recovery room in stable condition.  Plans were for him to go home the same day and follow up in the office in less than a week.  I will contact him by phone tonight. DD:  07/03/00 TD:  07/03/00 Job: [id number]

## 2010-08-03 ENCOUNTER — Encounter: Payer: Self-pay | Admitting: Cardiovascular Disease

## 2010-11-09 LAB — DIFFERENTIAL
Basophils Absolute: 0
Basophils Relative: 1
Lymphocytes Relative: 23
Monocytes Absolute: 0.6
Neutro Abs: 3.8
Neutrophils Relative %: 65

## 2010-11-09 LAB — POCT I-STAT, CHEM 8
HCT: 47
Hemoglobin: 16
Potassium: 4.3
Sodium: 138
TCO2: 28

## 2010-11-09 LAB — CBC
Hemoglobin: 15.1
MCHC: 34.3
Platelets: 165
RDW: 13.7

## 2010-11-09 LAB — POCT CARDIAC MARKERS
CKMB, poc: 2
Myoglobin, poc: 91
Operator id: 257131

## 2010-11-09 LAB — LIPID PANEL
HDL: 30 — ABNORMAL LOW
LDL Cholesterol: 152 — ABNORMAL HIGH
Triglycerides: 329 — ABNORMAL HIGH
VLDL: 66 — ABNORMAL HIGH

## 2010-11-09 LAB — HEMOGLOBIN A1C: Mean Plasma Glucose: 147

## 2010-11-09 LAB — PROTIME-INR
INR: 1
Prothrombin Time: 12.9

## 2010-11-10 LAB — PROTIME-INR
INR: 0.9
Prothrombin Time: 12.5

## 2010-11-10 LAB — BASIC METABOLIC PANEL
BUN: 33 — ABNORMAL HIGH
Calcium: 9.7
Chloride: 104
Creatinine, Ser: 1.54 — ABNORMAL HIGH
GFR calc Af Amer: 53 — ABNORMAL LOW

## 2010-11-10 LAB — CBC
MCHC: 35.5
MCV: 88
Platelets: 158
RBC: 4.39
WBC: 5.6

## 2010-11-10 LAB — URINALYSIS, ROUTINE W REFLEX MICROSCOPIC
Glucose, UA: NEGATIVE
Hgb urine dipstick: NEGATIVE
Ketones, ur: 15 — AB
Protein, ur: 30 — AB
Urobilinogen, UA: 1

## 2010-11-30 ENCOUNTER — Ambulatory Visit (INDEPENDENT_AMBULATORY_CARE_PROVIDER_SITE_OTHER): Payer: Medicare Other | Admitting: Cardiovascular Disease

## 2010-11-30 ENCOUNTER — Encounter: Payer: Self-pay | Admitting: Cardiovascular Disease

## 2010-11-30 ENCOUNTER — Encounter (INDEPENDENT_AMBULATORY_CARE_PROVIDER_SITE_OTHER): Payer: Medicare Other | Admitting: Cardiology

## 2010-11-30 DIAGNOSIS — I63239 Cerebral infarction due to unspecified occlusion or stenosis of unspecified carotid arteries: Secondary | ICD-10-CM

## 2010-11-30 DIAGNOSIS — I6529 Occlusion and stenosis of unspecified carotid artery: Secondary | ICD-10-CM

## 2010-11-30 DIAGNOSIS — I4891 Unspecified atrial fibrillation: Secondary | ICD-10-CM

## 2010-11-30 NOTE — Patient Instructions (Signed)
Your physician wants you to follow-up in: 1 YEAR.  You will receive a reminder letter in the mail two months in advance. If you don't receive a letter, please call our office to schedule the follow-up appointment.  Your physician has requested that you have a carotid duplex in 1 YEAR. This test is an ultrasound of the carotid arteries in your neck. It looks at blood flow through these arteries that supply the brain with blood. Allow one hour for this exam. There are no restrictions or special instructions.

## 2010-12-08 ENCOUNTER — Ambulatory Visit: Payer: Medicare Other | Admitting: Cardiovascular Disease

## 2010-12-13 ENCOUNTER — Encounter: Payer: Self-pay | Admitting: Cardiovascular Disease

## 2010-12-13 NOTE — Assessment & Plan Note (Signed)
Moderate carotid disease noted. The patient is on both antiplatelet therapy with aspirin and anticoagulation with pradaxa. Followup carotid duplex scan in one year.

## 2010-12-13 NOTE — Progress Notes (Signed)
HPI:  This is an 77 year old gentleman presented for followup evaluation. He has chronic atrial fibrillation and carotid stenosis. The patient has had 2 prior TIAs. He is chronically anticoagulated with pradaxa. He complains of chronic dyspnea with exertion.  He has undergone an evaluation with a 2-D echocardiogram which showed normal LV function and no significant valvular disease. A stress Myoview scan showed no ischemia.  He denies chest pain or pressure. He has no palpitations, lightheadedness, or history of syncope. Carotid duplex scanning was recently done and this showed stable, moderate bilateral disease in the range of 60-79%.  Outpatient Encounter Prescriptions as of 11/30/2010  Medication Sig Dispense Refill  . aspirin 81 MG EC tablet Take 81 mg by mouth daily.        . dabigatran (PRADAXA) 150 MG CAPS Take 1 capsule (150 mg total) by mouth every 12 (twelve) hours.  60 capsule  6  . glimepiride (AMARYL) 2 MG tablet Take 2 mg by mouth daily before breakfast.        . lisinopril (PRINIVIL,ZESTRIL) 20 MG tablet Take 20 mg by mouth daily.        . metFORMIN (GLUCOPHAGE-XR) 500 MG 24 hr tablet Take 1,000 mg by mouth 2 (two) times daily.        . pravastatin (PRAVACHOL) 40 MG tablet Take 40 mg by mouth daily.          No Known Allergies  Past Medical History  Diagnosis Date  . Hypertension   . Hypercholesterolemia   . Stroke   . Osteoarthritis   . Diabetes mellitus     typeII  . Chronic back pain     ROS: Negative except as per HPI  BP 155/93  Pulse 78  Wt 229 lb (103.874 kg)  PHYSICAL EXAM: Pt is alert and oriented, NAD HEENT: normal Neck: JVP - normal, carotids 2+= with bilateral bruits Lungs: CTA bilaterally CV: irregularly irregular without murmur or gallop Abd: soft, NT, Positive BS, no hepatomegaly Ext: no C/C/E, distal pulses intact and equal Skin: warm/dry no rash  EKG:  Atrial fibrillation 78 beats per minute, left axis deviation, age-indeterminate septal  infarction.  ASSESSMENT AND PLAN:

## 2010-12-13 NOTE — Assessment & Plan Note (Signed)
The patient continues to do well with the strategy of rate control. He is anticoagulated and is tolerating this without bleeding complications. He will followup in one year.

## 2011-01-09 ENCOUNTER — Other Ambulatory Visit: Payer: Self-pay | Admitting: Cardiovascular Disease

## 2011-03-16 ENCOUNTER — Telehealth: Payer: Self-pay | Admitting: Cardiovascular Disease

## 2011-03-16 NOTE — Telephone Encounter (Signed)
Insurance is going to fax a form today to our office about the pt's medication.  I made the pt aware that I would look for fax and if it did not come then I would call him back.

## 2011-03-16 NOTE — Telephone Encounter (Signed)
New problem Pt wants to talk to you about insurance papers that has been faxed.  Id Number [id number]

## 2011-03-17 NOTE — Telephone Encounter (Signed)
I spoke with the pt and made him aware that I did receive fax from [employer] about denial for Pradaxa.  Dr Excell Seltzer will have to appeal this denial and the paperwork is in the MD folder to review.  I made the pt aware and placed 18 days of Pradaxa samples at the front desk for pt pick-up.

## 2011-03-31 ENCOUNTER — Encounter: Payer: Self-pay | Admitting: Cardiovascular Disease

## 2011-04-11 ENCOUNTER — Encounter: Payer: Self-pay | Admitting: Cardiovascular Disease

## 2011-10-10 ENCOUNTER — Other Ambulatory Visit: Payer: Self-pay | Admitting: *Deleted

## 2011-10-10 MED ORDER — DABIGATRAN ETEXILATE MESYLATE 150 MG PO CAPS: 150.0000 mg | ORAL_CAPSULE | Freq: Two times a day (BID) | ORAL | Status: DC

## 2011-12-06 ENCOUNTER — Encounter (INDEPENDENT_AMBULATORY_CARE_PROVIDER_SITE_OTHER): Payer: Medicare Other

## 2011-12-06 ENCOUNTER — Encounter: Payer: Self-pay | Admitting: Cardiovascular Disease

## 2011-12-06 ENCOUNTER — Ambulatory Visit (INDEPENDENT_AMBULATORY_CARE_PROVIDER_SITE_OTHER): Payer: Medicare Other | Admitting: Cardiovascular Disease

## 2011-12-06 VITALS — BP 164/94 | HR 76 | Ht 73.0 in | Wt 226.0 lb

## 2011-12-06 DIAGNOSIS — I4891 Unspecified atrial fibrillation: Secondary | ICD-10-CM

## 2011-12-06 DIAGNOSIS — I6529 Occlusion and stenosis of unspecified carotid artery: Secondary | ICD-10-CM

## 2011-12-06 DIAGNOSIS — R0602 Shortness of breath: Secondary | ICD-10-CM

## 2011-12-06 MED ORDER — ASPIRIN EC 81 MG PO TBEC: 81.0000 mg | DELAYED_RELEASE_TABLET | Freq: Every day | ORAL | Status: DC

## 2011-12-06 MED ORDER — LOSARTAN POTASSIUM 100 MG PO TABS: 100.0000 mg | ORAL_TABLET | Freq: Every day | ORAL | Status: DC

## 2011-12-06 NOTE — Progress Notes (Signed)
HPI:  78 year old gentleman presenting for followup evaluation. The patient has chronic atrial fibrillation, carotid stenosis with history of TIA, and hypertension. He complains of progressive shortness of breath with exertion. He describes class III symptoms as he is short of breath with walking on level ground less than 2 blocks. The patient's shortness of breath is also associated with a feeling of generalized weakness. His shortness of breath is progressive. He denies orthopnea, PND, or leg swelling. He denies exertional chest pain or pressure. He complains of a nonproductive cough, but at times he also has some mucus. His son is with him today and he notes that his dad has had some voice changes. At times he sounds hoarse. He's had no hemoptysis. He complains of some sinus congestion. He's had no fevers or chills.  Outpatient Encounter Prescriptions as of 12/06/2011  Medication Sig Dispense Refill  . dabigatran (PRADAXA) 150 MG CAPS Take 1 capsule (150 mg total) by mouth every 12 (twelve) hours.  60 capsule  6  . glimepiride (AMARYL) 2 MG tablet Take 2 mg by mouth daily before breakfast.        . metFORMIN (GLUCOPHAGE-XR) 500 MG 24 hr tablet Take 1,000 mg by mouth 2 (two) times daily.        Marland Kitchen neomycin-polymyxin-hydrocortisone (CORTISPORIN) otic solution Place 2-5 drops in ear(s) 2 (two) times daily before lunch and supper.      . pravastatin (PRAVACHOL) 40 MG tablet Take 40 mg by mouth daily.        Marland Kitchen DISCONTD: aspirin 81 MG EC tablet Take 81 mg by mouth daily.        Marland Kitchen DISCONTD: lisinopril (PRINIVIL,ZESTRIL) 20 MG tablet Take 20 mg by mouth daily.          No Known Allergies  Past Medical History  Diagnosis Date  . Hypertension   . Hypercholesterolemia   . Stroke   . Osteoarthritis   . Diabetes mellitus     typeII  . Chronic back pain     ROS: Negative except as per HPI  BP 164/94  Pulse 76  Ht 6\' 1"  (1.854 m)  Wt 102.513 kg (226 lb)  BMI 29.82 kg/m2  PHYSICAL EXAM: Pt  is alert and oriented, pleasant elderly male in NAD HEENT: normal Neck: JVP - normal, carotids 2+= with bilateral bruits Lungs: CTA bilaterally CV: Irregularly irregular without murmur or gallop Abd: soft, NT, Positive BS, no hepatomegaly Ext: Trace bilateral pretibial edema, distal pulses intact and equal Skin: warm/dry no rash  EKG:  Atrial fibrillation 76 beats per minute, left anterior fascicular block.  ASSESSMENT AND PLAN: 1. Shortness of breath, progressive. Uncertain etiology. I do not appreciate any clinical exam findings of congestive heart failure, as there is no evidence of volume overload. I have recommended a PA and lateral chest x-ray to rule out parenchymal lung disease. Since there appears to be a clear upper airway component to this, I have recommended that he discontinue lisinopril and start losartan 100 mg daily. Note that he has had normal LV function by echocardiography in the past.  2. atrial fibrillation. The patient has long-standing atrial fib. He has tolerated a strategy of rate control and anticoagulation. I wonder if his atrial fib rates are uncontrolled with activity. This could be contributing to his shortness of breath. I requested a 24-hour Holter monitor to evaluate.  3. Hypertension. Blood pressure was suboptimal control today. Will change lisinopril to losartan and follow blood pressure.  [name] 12/06/2011 10:40  AM     

## 2011-12-06 NOTE — Patient Instructions (Addendum)
Your physician wants you to follow-up in: ONE YEAR WITH DR Theodoro Parma will receive a reminder letter in the mail two months in advance. If you don't receive a letter, please call our office to schedule the follow-up appointment.   STOP LISINOPRIL  START LOSARTAN 100 MG ONCE DAILY  A chest x-ray takes a picture of the organs and structures inside the chest, including the heart, lungs, and blood vessels. This test can show several things, including, whether the heart is enlarges; whether fluid is building up in the lungs; and whether pacemaker / defibrillator leads are still in place. AT [address]  Your physician has recommended that you wear a 24 holter monitor. Holter monitors are medical devices that record the heart's electrical activity. Doctors most often use these monitors to diagnose arrhythmias. Arrhythmias are problems with the speed or rhythm of the heartbeat. The monitor is a small, portable device. You can wear one while you do your normal daily activities. This is usually used to diagnose what is causing palpitations/syncope (passing out).   Your physician has requested that you have a carotid duplex. This test is an ultrasound of the carotid arteries in your neck. It looks at blood flow through these arteries that supply the brain with blood. Allow one hour for this exam. There are no restrictions or special instructions.

## 2011-12-07 ENCOUNTER — Encounter (INDEPENDENT_AMBULATORY_CARE_PROVIDER_SITE_OTHER): Payer: Medicare Other

## 2011-12-07 ENCOUNTER — Ambulatory Visit (INDEPENDENT_AMBULATORY_CARE_PROVIDER_SITE_OTHER)
Admission: RE | Admit: 2011-12-07 | Discharge: 2011-12-07 | Disposition: A | Discharge status: Home / Self Care | Payer: Medicare Other | Source: Ambulatory Visit | Attending: Cardiovascular Disease | Admitting: Cardiovascular Disease

## 2011-12-07 DIAGNOSIS — R0602 Shortness of breath: Secondary | ICD-10-CM

## 2011-12-07 DIAGNOSIS — I6529 Occlusion and stenosis of unspecified carotid artery: Secondary | ICD-10-CM

## 2011-12-12 ENCOUNTER — Encounter: Payer: Self-pay | Admitting: Cardiovascular Disease

## 2011-12-12 NOTE — Telephone Encounter (Signed)
This encounter was created in error - please disregard.

## 2011-12-12 NOTE — Telephone Encounter (Signed)
plz return call to patient [phone] regarding patient test results

## 2012-02-08 ENCOUNTER — Telehealth: Payer: Self-pay | Admitting: Cardiovascular Disease

## 2012-02-08 NOTE — Telephone Encounter (Signed)
Samples of Pradaxa is ready for pt. to pick up in front office. Pt. Is aware.

## 2012-02-08 NOTE — Telephone Encounter (Signed)
plz return call to pt [phone] regarding Pradaxa 150mg  medication samples.

## 2012-02-14 DIAGNOSIS — Z9289 Personal history of other medical treatment: Secondary | ICD-10-CM

## 2012-02-14 HISTORY — DX: Personal history of other medical treatment: Z92.89

## 2012-04-01 ENCOUNTER — Encounter: Payer: Self-pay | Admitting: Physician Assistant

## 2012-04-09 ENCOUNTER — Other Ambulatory Visit: Payer: Self-pay

## 2012-04-16 ENCOUNTER — Other Ambulatory Visit (HOSPITAL_COMMUNITY): Payer: Medicare Other

## 2012-04-23 ENCOUNTER — Ambulatory Visit: Payer: Medicare Other | Admitting: Physician Assistant

## 2012-04-24 ENCOUNTER — Ambulatory Visit (INDEPENDENT_AMBULATORY_CARE_PROVIDER_SITE_OTHER): Payer: Medicare Other | Admitting: Physician Assistant

## 2012-04-24 ENCOUNTER — Ambulatory Visit (HOSPITAL_COMMUNITY): Payer: Medicare Other | Attending: Cardiovascular Disease

## 2012-04-24 ENCOUNTER — Encounter: Payer: Self-pay | Admitting: Physician Assistant

## 2012-04-24 VITALS — BP 156/80 | HR 84 | Ht 72.0 in | Wt 222.0 lb

## 2012-04-24 DIAGNOSIS — I079 Rheumatic tricuspid valve disease, unspecified: Secondary | ICD-10-CM | POA: Insufficient documentation

## 2012-04-24 DIAGNOSIS — E119 Type 2 diabetes mellitus without complications: Secondary | ICD-10-CM | POA: Insufficient documentation

## 2012-04-24 DIAGNOSIS — Z8673 Personal history of transient ischemic attack (TIA), and cerebral infarction without residual deficits: Secondary | ICD-10-CM

## 2012-04-24 DIAGNOSIS — I059 Rheumatic mitral valve disease, unspecified: Secondary | ICD-10-CM | POA: Insufficient documentation

## 2012-04-24 DIAGNOSIS — I4891 Unspecified atrial fibrillation: Secondary | ICD-10-CM

## 2012-04-24 DIAGNOSIS — R0989 Other specified symptoms and signs involving the circulatory and respiratory systems: Secondary | ICD-10-CM | POA: Insufficient documentation

## 2012-04-24 DIAGNOSIS — I10 Essential (primary) hypertension: Secondary | ICD-10-CM | POA: Insufficient documentation

## 2012-04-24 DIAGNOSIS — E785 Hyperlipidemia, unspecified: Secondary | ICD-10-CM | POA: Insufficient documentation

## 2012-04-24 DIAGNOSIS — I359 Nonrheumatic aortic valve disorder, unspecified: Secondary | ICD-10-CM | POA: Insufficient documentation

## 2012-04-24 DIAGNOSIS — R0609 Other forms of dyspnea: Secondary | ICD-10-CM | POA: Insufficient documentation

## 2012-04-24 LAB — BASIC METABOLIC PANEL
BUN: 27 mg/dL — ABNORMAL HIGH (ref 6–23)
CO2: 25 mEq/L (ref 19–32)
Chloride: 105 mEq/L (ref 96–112)
Glucose, Bld: 157 mg/dL — ABNORMAL HIGH (ref 70–99)
Potassium: 4.7 mEq/L (ref 3.5–5.1)

## 2012-04-24 MED ORDER — POTASSIUM CHLORIDE ER 10 MEQ PO TBCR: 10.0000 meq | EXTENDED_RELEASE_TABLET | Freq: Every day | ORAL | Status: DC

## 2012-04-24 MED ORDER — FUROSEMIDE 20 MG PO TABS
20.0000 mg | ORAL_TABLET | Freq: Every day | ORAL | Status: DC
Start: 2012-04-24 — End: 2012-05-07

## 2012-04-24 NOTE — Progress Notes (Signed)
[address], [address] [place], [place]  [address] Phone: [phone], Fax:  [phone]  Date:  04/24/2012   ID:  Frank Marks, DOB 12-29-1926, MRN [id number]  PCP:  Darnelle Bos, MD  Primary Cardiologist:  Dr. [name]     History of Present Illness: Frank Marks is a 79 y.o. male who returns for the evaluation of dyspnea.  He has a hx of atrial fibrillation, carotid stenosis and history of TIA, HTN.  Myoview 3/12: Not gated, low risk, no ischemia.  Echo 3/12: EF 45-50%, normal wall motion, mild BAE, PASP 43.  Last seen by Dr. Excell Seltzer in 10/13. Patient had progressive dyspnea and his ACE inhibitor was changed to an ARB.  Chest x-ray was normal.  Patient notes worsening DOE over the last year.  Denies orthopnea, PND.  Notes Class IIb-III dyspnea.  No cough.  He may have some chest tightness with activity but largely denies chest pain.  Does note increased LE edema and his L leg is usually larger than his R.  Denies arm or jaw pain with DOE.  Denies assoc nausea or diaphoresis.  No syncope.  Echo done today was arranged prior to his visit today.  I had Dr. Shirlee Latch read it for me quickly and it demonstrates normal LVF, mild to mod TR, mild to mod MR, mild AS, mild pulmo HTN.  Labs (2/14):  Hgb 13.9, creatinine 1.15, K 3.9, ALT 14, LDL 119  Wt Readings from Last 3 Encounters:  04/24/12 222 lb (100.699 kg)  12/06/11 226 lb (102.513 kg)  11/30/10 229 lb (103.874 kg)     Past Medical History  Diagnosis Date  . Hypertension   . Hypercholesterolemia   . Stroke   . Osteoarthritis   . Diabetes mellitus     typeII  . Chronic back pain     Current Outpatient Prescriptions  Medication Sig Dispense Refill  . aspirin EC 81 MG tablet Take 1 tablet (81 mg total) by mouth daily.  30 tablet  12  . dabigatran (PRADAXA) 150 MG CAPS Take 1 capsule (150 mg total) by mouth every 12 (twelve) hours.  60 capsule  6  . glimepiride (AMARYL) 2 MG tablet Take 2 mg by mouth  daily before breakfast.        . losartan (COZAAR) 100 MG tablet Take 1 tablet (100 mg total) by mouth daily.  90 tablet  3  . metFORMIN (GLUCOPHAGE-XR) 500 MG 24 hr tablet Take 1,000 mg by mouth 2 (two) times daily.        Marland Kitchen neomycin-polymyxin-hydrocortisone (CORTISPORIN) otic solution Place 2-5 drops in ear(s) 2 (two) times daily before lunch and supper.      . pravastatin (PRAVACHOL) 40 MG tablet Take 40 mg by mouth daily.         No current facility-administered medications for this visit.    Allergies:   No Known Allergies  Social History:  The patient  reports that he has never smoked. He does not have any smokeless tobacco history on file. He reports that he does not drink alcohol or use illicit drugs.   ROS:  Please see the history of present illness.   He had symptoms of another TIA 2 weeks ago.  Daughter notes recent onset of tremors and shuffling gait.  Sounds like he may see neuro soon to evaluate for Parkinsons.   All other systems reviewed and negative.   PHYSICAL EXAM: VS:  BP 156/80  Pulse 84  Ht  6' (1.829 m)  Wt 222 lb (100.699 kg)  BMI 30.1 kg/m2 Well nourished, well developed, in no acute distress HEENT: normal Neck: no JVD Vascular:  No carotid bruits Cardiac:  normal S1, S2; RRR; 1-2 systolic murmur RUSB Lungs:  clear to auscultation bilaterally, no wheezing, rhonchi or rales Abd: soft, nontender, no hepatomegaly Ext: trace to 1+ LE edema, L>R Skin: warm and dry Neuro:  CNs 2-12 intact, no focal abnormalities noted  EKG:  AFib, HR, 84, LAD, PVCs, NSSTTW changes     ASSESSMENT AND PLAN:  1. Dyspnea:  Etiology not clear.  EF is normal.  He just has minimal signs of excess volume.  Question if he has diastolic dysfunction.  Also has a prior hx of smoking and may have a component of COPD.  HR looks controlled in AFib.  I suggest we check a BMET and BNP today.  Try a low dose of Lasix at 20 mg QD.  Check repeat BMET in one week.  Arrange Lexiscan Myoview to rule  out ischemia.  Arrange close follow up.  I spoke with Dr. Excell Seltzer over the phone who agreed. 2. Atrial Fibrillation:  Rate controlled.  He remains on Pradaxa.  If stress test abnormal and he needs a cardiac cath, he may need some LMWH overlap given his frequent hx of TIAs while off Pradaxa. 3. Hx of TIAs:  Continue Pradaxa and ASA. 4. Hypertension:  BP elevated.  Will see how he responds to low dose Lasix.  5. Disposition:  Follow up with me or Dr. Excell Seltzer in 2 weeks.  SignedTereso Newcomer, PA-C  10:08 AM 04/24/2012

## 2012-04-24 NOTE — Patient Instructions (Addendum)
START LASIX 20 MG 1 TAB DAILY START POTASSIUM 10 MEQ DAILY, MAKE SURE TO TAKE THIS WITH THE LASIX  LAB TODAY; BMET, BNP  REPEAT BMET 1 WEEK  Your physician has requested that you have a lexiscan myoview DX 786.05, [phone], . For further information please visit [email or web address] Please follow instruction sheet, as given.  PLEASE FOLLOW UP WITH SCOTT WEAVER, PAC SAME TIME/DAY THAT DR. Excell Seltzer IS IN THE OFFICE

## 2012-04-24 NOTE — Progress Notes (Signed)
Echocardiogram performed.  

## 2012-04-25 ENCOUNTER — Telehealth: Payer: Self-pay | Admitting: *Deleted

## 2012-04-25 NOTE — Telephone Encounter (Signed)
Message copied by Tarri Fuller on Thu Apr 25, 2012  2:25 PM ------      Message from: [place], [place] T      Created: Wed Apr 24, 2012  1:58 PM       Renal fxn ok.      BNP only slightly elevated.      Try taking Lasix and K+ for 5 days only, then stop.      May take Lasix and K+ QD prn after that for any increased LE edema.      Make sure he has a BMET done in one week.      Call us if symptoms improve but then worsen again with stopping the Lasix.      Tereso Newcomer, PA-C  1:58 PM 04/24/2012 ------

## 2012-04-25 NOTE — Telephone Encounter (Signed)
lmptcb to go over lab results and med changes 

## 2012-04-26 NOTE — Telephone Encounter (Signed)
pt notified about lab results and to take lasix and K+ 5 days then stop and take PRN., pt verbalized understanding today to all instructions with read back to me

## 2012-04-26 NOTE — Telephone Encounter (Signed)
Follow-up:    Patient returned your call.  Please call back. 

## 2012-04-26 NOTE — Telephone Encounter (Signed)
Pt rtn your call and needs call asap he will be waiting

## 2012-04-30 ENCOUNTER — Ambulatory Visit (HOSPITAL_COMMUNITY): Payer: Medicare Other | Attending: Cardiology | Admitting: Radiology

## 2012-04-30 ENCOUNTER — Other Ambulatory Visit (INDEPENDENT_AMBULATORY_CARE_PROVIDER_SITE_OTHER): Payer: Medicare Other

## 2012-04-30 VITALS — BP 135/88 | HR 92 | Ht 72.0 in | Wt 218.0 lb

## 2012-04-30 DIAGNOSIS — I10 Essential (primary) hypertension: Secondary | ICD-10-CM | POA: Insufficient documentation

## 2012-04-30 DIAGNOSIS — R0989 Other specified symptoms and signs involving the circulatory and respiratory systems: Secondary | ICD-10-CM | POA: Insufficient documentation

## 2012-04-30 DIAGNOSIS — I4891 Unspecified atrial fibrillation: Secondary | ICD-10-CM

## 2012-04-30 DIAGNOSIS — R0609 Other forms of dyspnea: Secondary | ICD-10-CM | POA: Insufficient documentation

## 2012-04-30 DIAGNOSIS — E119 Type 2 diabetes mellitus without complications: Secondary | ICD-10-CM | POA: Insufficient documentation

## 2012-04-30 DIAGNOSIS — R0602 Shortness of breath: Secondary | ICD-10-CM

## 2012-04-30 LAB — BASIC METABOLIC PANEL
BUN: 30 mg/dL — ABNORMAL HIGH (ref 6–23)
CO2: 24 mEq/L (ref 19–32)
Chloride: 102 mEq/L (ref 96–112)
Creatinine, Ser: 1.4 mg/dL (ref 0.4–1.5)

## 2012-04-30 MED ORDER — TECHNETIUM TC 99M SESTAMIBI GENERIC - CARDIOLITE
11.0000 | Freq: Once | INTRAVENOUS | Status: AC | PRN
  Administered 2012-04-30: 11 via INTRAVENOUS

## 2012-04-30 MED ORDER — TECHNETIUM TC 99M SESTAMIBI GENERIC - CARDIOLITE
33.0000 | Freq: Once | INTRAVENOUS | Status: AC | PRN
  Administered 2012-04-30: 33 via INTRAVENOUS

## 2012-04-30 MED ORDER — REGADENOSON 0.4 MG/5ML IV SOLN
0.4000 mg | Freq: Once | INTRAVENOUS | Status: AC
  Administered 2012-04-30: 0.4 mg via INTRAVENOUS

## 2012-04-30 NOTE — Progress Notes (Signed)
[facility] SITE 3 NUCLEAR MED [address] [place], [place] [address] [phone]    Cardiology Nuclear Med Study  Frank Marks is a 79 y.o. male     MRN : [id number]     DOB: [birth year]  Procedure Date: 04/30/2012  Nuclear Med Background Indication for Stress Test:  Evaluation for Ischemia History:  '12 MPS:no ischemia; 04/24/12 Echo:EF=55% Cardiac Risk Factors: Carotid Disease, Hypertension, Lipids, NIDDM and TIA  Symptoms:  DOE and Fatigue   Nuclear Pre-Procedure Caffeine/Decaff Intake:  None > 12 hrs NPO After: 7:00pm   Lungs:  Clear. O2 Sat: 98% on room air. IV 0.9% NS with Angio Cath:  20g  IV Site: R Antecubital x 1, tolerated well IV Started by:  Irean Hong, RN  Chest Size (in):  42 Cup Size: n/a  Height: 6' (1.829 m)  Weight:  218 lb (98.884 kg)  BMI:  Body mass index is 29.56 kg/(m^2). Tech Comments:  No Amaryl or Metformin this am    Nuclear Med Study 1 or 2 day study: 1 day  Stress Test Type:  Lexiscan  Reading MD: Cassell Clement, MD  Order Authorizing Provider:  [name], MD  Resting Radionuclide: Technetium 58m Sestamibi  Resting Radionuclide Dose: 11.0 mCi   Stress Radionuclide:  Technetium 22m Sestamibi  Stress Radionuclide Dose: 32.6 mCi           Stress Protocol Rest HR: 92 Stress HR: 107  Rest BP: 135/88 Stress BP: 162/96  Exercise Time (min): n/a METS: n/a   Predicted Max HR: 137 bpm % Max HR: 78.1 bpm Rate Pressure Product: 21308   Dose of Adenosine (mg):  n/a Dose of Lexiscan: 0.4 mg  Dose of Atropine (mg): n/a Dose of Dobutamine: n/a mcg/kg/min (at max HR)  Stress Test Technologist: Smiley Houseman, CMA-N  Nuclear Technologist:  Domenic Polite, CNMT     Rest Procedure:  Myocardial perfusion imaging was performed at rest 45 minutes following the intravenous administration of Technetium 83m Sestamibi.  Rest ECG: Atrial Fibrilliation  Stress Procedure:  The patient received IV Lexiscan 0.4 mg over 15-seconds.   Technetium 36m Sestamibi injected at 30-seconds.  Quantitative spect images were obtained after a 45 minute delay.  Stress ECG: No significant change from baseline ECG  QPS Raw Data Images:  Normal; no motion artifact; normal heart/lung ratio. Stress Images:  There is mild apical thinning with normal uptake in other regions. Rest Images:  There is mild apical thinning with normal uptake in other regions. Subtraction (SDS):  No evidence of ischemia. Transient Ischemic Dilatation (Normal <1.22):  1.07 Lung/Heart Ratio (Normal <0.45):  0.33  Quantitative Gated Spect Images QGS EDV:  n/a QGS ESV:  n/a  Impression Exercise Capacity:  Lexiscan with no exercise. BP Response:  Normal blood pressure response. Clinical Symptoms:  No chest pain. ECG Impression:  No significant ST segment change suggestive of ischemia. Comparison with Prior Nuclear Study: No images to compare  Overall Impression:  Normal stress nuclear study.  LV Ejection Fraction: Study not gated.  LV Wall Motion:  Study not gated  [employer]

## 2012-05-01 ENCOUNTER — Telehealth: Payer: Self-pay | Admitting: *Deleted

## 2012-05-01 DIAGNOSIS — R609 Edema, unspecified: Secondary | ICD-10-CM

## 2012-05-01 DIAGNOSIS — I4891 Unspecified atrial fibrillation: Secondary | ICD-10-CM

## 2012-05-01 MED ORDER — POTASSIUM CHLORIDE ER 10 MEQ PO TBCR: 10.0000 meq | EXTENDED_RELEASE_TABLET | Freq: Every day | ORAL | Status: DC

## 2012-05-01 NOTE — Telephone Encounter (Signed)
Message copied by Tarri Fuller on Wed May 01, 2012  9:15 AM ------      Message from: [place], [place] T      Created: Wed May 01, 2012  8:36 AM       Stop taking K+.  Do not take any more K+.      Repeat BMET Monday or at f/u OV next week (Tuesday).      Tereso Newcomer, PA-C  8:36 AM 05/01/2012         ------

## 2012-05-01 NOTE — Telephone Encounter (Signed)
Pt notified about lab results and to hold K+ until further advised by cards, will have repeat bmet 3/25 when he come sin to see Tereso Newcomer, PAC. Pt verbalized understanding to me to all instructions todfay

## 2012-05-02 ENCOUNTER — Encounter: Payer: Self-pay | Admitting: Physician Assistant

## 2012-05-07 ENCOUNTER — Encounter: Payer: Self-pay | Admitting: Physician Assistant

## 2012-05-07 ENCOUNTER — Ambulatory Visit (INDEPENDENT_AMBULATORY_CARE_PROVIDER_SITE_OTHER): Payer: Medicare Other | Admitting: Physician Assistant

## 2012-05-07 VITALS — BP 142/82 | HR 90 | Ht 72.0 in | Wt 227.4 lb

## 2012-05-07 DIAGNOSIS — G459 Transient cerebral ischemic attack, unspecified: Secondary | ICD-10-CM

## 2012-05-07 DIAGNOSIS — I10 Essential (primary) hypertension: Secondary | ICD-10-CM

## 2012-05-07 DIAGNOSIS — R0602 Shortness of breath: Secondary | ICD-10-CM

## 2012-05-07 DIAGNOSIS — I4891 Unspecified atrial fibrillation: Secondary | ICD-10-CM

## 2012-05-07 LAB — BASIC METABOLIC PANEL
BUN: 24 mg/dL — ABNORMAL HIGH (ref 6–23)
Calcium: 9.7 mg/dL (ref 8.4–10.5)
GFR: 54.06 mL/min — ABNORMAL LOW (ref >60.00)
Glucose, Bld: 238 mg/dL — ABNORMAL HIGH (ref 70–99)
Potassium: 5.1 mEq/L (ref 3.5–5.1)

## 2012-05-07 NOTE — Patient Instructions (Addendum)
Your physician has recommended you make the following change in your medication: STOP your Lasix  Your physician recommends that you return for lab work in: today (bmet)  Your physician wants you to follow-up in: 6 months with Dr Excell Seltzer.   You will receive a reminder letter in the mail two months in advance. If you don't receive a letter, please call our office to schedule the follow-up appointment.

## 2012-05-07 NOTE — Progress Notes (Signed)
[address] [place], [place]  [address] Phone: [phone], Fax:  [phone]  Date:  05/07/2012   ID:  Frank Marks, DOB 1927-02-02, MRN [id number]  PCP:  Darnelle Bos, MD  Primary Cardiologist:  Dr. [name]     History of Present Illness: Frank Marks is a 79 y.o. male who returns for f/u on dyspnea.  He has a hx of AFib, carotid stenosis with a history of TIA, HTN.  I saw him recently with c/o's worsening DOE over the last year along with increased LE edema.  Echo 04/24/12: mild LVH, EF 50-55%, mild AS (mean 9), MAC, mild MR, mod-severe BAE, mod TR, PASP 39.  I had him try taking Lasix for several days. Myoview was arranged. This was normal without ischemia.  He has not had any improvement with the Lasix.  He continues to describe NYHA Class IIb-III dyspnea.  No CP.  No orthopnea, PND.  LE edema is stable.  No syncope.  No cough.  No wheezing.  Labs (2/14):  Hgb 13.9, creatinine 1.15, K 3.9, ALT 14, LDL 119 Labs (3/14):  K 4.7 => 5.5, Cr 1.3 => 1.4, BNP 106  Wt Readings from Last 3 Encounters:  05/07/12 227 lb 6.4 oz (103.148 kg)  04/30/12 218 lb (98.884 kg)  04/24/12 222 lb (100.699 kg)     Past Medical History  Diagnosis Date  . Hypertension   . Hypercholesterolemia   . Stroke   . Osteoarthritis   . Diabetes mellitus     typeII  . Chronic back pain   . Hx of cardiovascular stress test     Lex MV 3/14:  Not gated, apical thinning, no ischemia.   Marland Kitchen Hx of echocardiogram     Echo 04/24/12: mild LVH, EF 50-55%, mild AS (mean 9), MAC, mild MR, mod-severe BAE, mod TR, PASP 39    Current Outpatient Prescriptions  Medication Sig Dispense Refill  . aspirin EC 81 MG tablet Take 1 tablet (81 mg total) by mouth daily.  30 tablet  12  . dabigatran (PRADAXA) 150 MG CAPS Take 1 capsule (150 mg total) by mouth every 12 (twelve) hours.  60 capsule  6  . furosemide (LASIX) 20 MG tablet Take 1 tablet (20 mg total) by mouth daily.  30 tablet   11  . glimepiride (AMARYL) 2 MG tablet Take 2 mg by mouth daily before breakfast.        . losartan (COZAAR) 100 MG tablet Take 1 tablet (100 mg total) by mouth daily.  90 tablet  3  . metFORMIN (GLUCOPHAGE-XR) 500 MG 24 hr tablet Take 1,000 mg by mouth 2 (two) times daily.        Marland Kitchen neomycin-polymyxin-hydrocortisone (CORTISPORIN) otic solution Place 2-5 drops in ear(s) 2 (two) times daily before lunch and supper.      . pravastatin (PRAVACHOL) 40 MG tablet Take 40 mg by mouth daily.         No current facility-administered medications for this visit.    Allergies:   No Known Allergies  Social History:  The patient  reports that he has never smoked. He does not have any smokeless tobacco history on file. He reports that he does not drink alcohol or use illicit drugs.   ROS:  Please see the history of present illness.    All other systems reviewed and negative.   PHYSICAL EXAM: VS:  BP 142/82  Pulse 90  Ht 6' (1.829 m)  [employer]  227 lb 6.4 oz (103.148 kg)  BMI 30.83 kg/m2 Well nourished, well developed, in no acute distress HEENT: normal Neck: no JVD Cardiac:  normal S1, S2; RRR; 1-2 systolic murmur RUSB Lungs:  clear to auscultation bilaterally, no wheezing, rhonchi or rales Abd: soft, nontender, no hepatomegaly Ext: trace to 1+ LE edema, L>R Skin: warm and dry Neuro:  CNs 2-12 intact, no focal abnormalities noted  EKG:  AFib, HR, 90, LAD, PVCs, NSSTTW changes     ASSESSMENT AND PLAN:  1. Dyspnea:  Etiology not clear.  EF is normal and his stress test was normal.  HR is controlled in AFib.  He had no clear improvement with diuresis.  He can stop Lasix.  Check a BMET today.  Recommend he follow up with Darnelle Bos, MD to discuss options for further evaluation.  We considered whether to pursue pulmonary evaluation.  Patient also seen by Dr. [name].  Will defer decision on pulmonary evaluation to Dr. Earl Gala. 2. Atrial Fibrillation:  Rate controlled.  He remains on  Pradaxa.  3. Hx of TIAs:  Continue Pradaxa and ASA. 4. Hypertension:  Fair control. Continue current Rx.  5. Disposition:  Follow up Dr. Excell Seltzer in 6 mos.  Signed, Tereso Newcomer, PA-C  2:34 PM 05/07/2012

## 2012-05-22 ENCOUNTER — Other Ambulatory Visit: Payer: Self-pay | Admitting: *Deleted

## 2012-05-22 MED ORDER — DABIGATRAN ETEXILATE MESYLATE 150 MG PO CAPS: 150.0000 mg | ORAL_CAPSULE | Freq: Two times a day (BID) | ORAL | Status: DC

## 2012-07-17 ENCOUNTER — Other Ambulatory Visit: Payer: Self-pay | Admitting: Neurosurgery

## 2012-07-17 DIAGNOSIS — M48061 Spinal stenosis, lumbar region without neurogenic claudication: Secondary | ICD-10-CM

## 2012-07-25 ENCOUNTER — Ambulatory Visit
Admission: RE | Admit: 2012-07-25 | Discharge: 2012-07-25 | Disposition: A | Discharge status: Home / Self Care | Payer: Medicare Other | Source: Ambulatory Visit | Attending: Neurosurgery | Admitting: Neurosurgery

## 2012-07-25 DIAGNOSIS — M48061 Spinal stenosis, lumbar region without neurogenic claudication: Secondary | ICD-10-CM

## 2012-07-25 MED ORDER — GADOBENATE DIMEGLUMINE 529 MG/ML IV SOLN
20.0000 mL | Freq: Once | INTRAVENOUS | Status: AC | PRN
  Administered 2012-07-25: 20 mL via INTRAVENOUS

## 2012-12-09 ENCOUNTER — Ambulatory Visit (HOSPITAL_COMMUNITY): Payer: Medicare Other | Attending: Cardiovascular Disease

## 2012-12-09 DIAGNOSIS — I658 Occlusion and stenosis of other precerebral arteries: Secondary | ICD-10-CM | POA: Insufficient documentation

## 2012-12-09 DIAGNOSIS — I10 Essential (primary) hypertension: Secondary | ICD-10-CM | POA: Insufficient documentation

## 2012-12-09 DIAGNOSIS — E119 Type 2 diabetes mellitus without complications: Secondary | ICD-10-CM | POA: Insufficient documentation

## 2012-12-09 DIAGNOSIS — Z8673 Personal history of transient ischemic attack (TIA), and cerebral infarction without residual deficits: Secondary | ICD-10-CM | POA: Insufficient documentation

## 2012-12-09 DIAGNOSIS — I6529 Occlusion and stenosis of unspecified carotid artery: Secondary | ICD-10-CM

## 2012-12-09 DIAGNOSIS — E785 Hyperlipidemia, unspecified: Secondary | ICD-10-CM | POA: Insufficient documentation

## 2012-12-23 ENCOUNTER — Other Ambulatory Visit: Payer: Self-pay | Admitting: Cardiovascular Disease

## 2013-02-19 ENCOUNTER — Ambulatory Visit (INDEPENDENT_AMBULATORY_CARE_PROVIDER_SITE_OTHER): Payer: Medicare Other | Admitting: Cardiovascular Disease

## 2013-02-19 ENCOUNTER — Other Ambulatory Visit: Payer: Self-pay | Admitting: *Deleted

## 2013-02-19 ENCOUNTER — Encounter: Payer: Self-pay | Admitting: Cardiovascular Disease

## 2013-02-19 VITALS — BP 132/82 | HR 93 | Ht 72.0 in | Wt 220.8 lb

## 2013-02-19 DIAGNOSIS — I63239 Cerebral infarction due to unspecified occlusion or stenosis of unspecified carotid arteries: Secondary | ICD-10-CM

## 2013-02-19 DIAGNOSIS — I4891 Unspecified atrial fibrillation: Secondary | ICD-10-CM

## 2013-02-19 DIAGNOSIS — I10 Essential (primary) hypertension: Secondary | ICD-10-CM

## 2013-02-19 NOTE — Progress Notes (Signed)
HPI:  80 year old gentleman presenting for followup evaluation. The patient has chronic dyspnea. He has a history of atrial fibrillation, carotid stenosis, TIA, and hypertension. Noninvasive evaluation has shown no evidence of LV dysfunction. His ejection fraction is in the range of 50-55%. He has mild aortic stenosis, significant biatrial enlargement, and an estimated pulmonary artery systolic pressure of 39. He has been given a trial of diuretic therapy with no improvement in his breathing. A Holter monitor has been done to evaluate rate control of his atrial fibrillation. His average heart rate was 88 beats per minute. A chest x-ray has shown clear lung fields.  He states that "things are about the same." He has not appreciated any difference in his breathing. He is significantly dyspneic with 1 flight of stairs. He denies chest pain or pressure, orthopnea, or PND. He's had mild leg swelling which has been chronic and unchanged. He denies palpitations, he's had no recurrence of stroke or TIA symptoms.  The patient has developed a tremor. He also has a shuffling gait. There is a pending neurologic evaluation later this month. Symptoms have been ongoing for greater than one year.  Outpatient Encounter Prescriptions as of 02/19/2013  Medication Sig  . amLODipine (NORVASC) 2.5 MG tablet Take 1 tablet by mouth daily.  Marland Kitchen. aspirin EC 81 MG tablet Take 1 tablet (81 mg total) by mouth daily.  . Cyanocobalamin (VITAMIN B 12 PO) Take 500 mg by mouth 2 (two) times daily.  Marland Kitchen. glimepiride (AMARYL) 2 MG tablet Take 2 mg by mouth daily before breakfast.    . metFORMIN (GLUCOPHAGE-XR) 500 MG 24 hr tablet Take 1,000 mg by mouth 2 (two) times daily.    Marland Kitchen. neomycin-polymyxin-hydrocortisone (CORTISPORIN) otic solution Place 2-5 drops in ear(s) 2 (two) times daily before lunch and supper.  Marland Kitchen. PRADAXA 150 MG CAPS capsule TAKE ONE CAPSULE BY MOUTH EVERY 12 HOURS  . pravastatin (PRAVACHOL) 40 MG tablet Take 40 mg by mouth  daily.    . [DISCONTINUED] lisinopril (PRINIVIL,ZESTRIL) 20 MG tablet Take 1 tablet by mouth daily.  . [DISCONTINUED] losartan (COZAAR) 100 MG tablet Take 1 tablet (100 mg total) by mouth daily.    No Known Allergies  Past Medical History  Diagnosis Date  . Hypertension   . Hypercholesterolemia   . Stroke   . Osteoarthritis   . Diabetes mellitus     typeII  . Chronic back pain   . Hx of cardiovascular stress test     Lex MV 3/14:  Not gated, apical thinning, no ischemia.   Marland Kitchen. Hx of echocardiogram     Echo 04/24/12: mild LVH, EF 50-55%, mild AS (mean 9), MAC, mild MR, mod-severe BAE, mod TR, PASP 39   ROS: Negative except as per HPI  BP 132/82  Pulse 93  Ht 6' (1.829 m)  Wt 220 lb 12.8 oz (100.154 kg)  BMI 29.94 kg/m2  PHYSICAL EXAM: Pt is alert and oriented, pleasant elderly male in NAD HEENT: normal Neck: JVP - normal, carotids 2+= without bruits Lungs: CTA bilaterally CV: RRR without murmur or gallop Abd: soft, NT, Positive BS, no hepatomegaly Ext: Trace pretibial edema, distal pulses intact and equal Skin: warm/dry no rash  EKG:  Atrial fibrillation 93 beats per minute, left anterior fascicular block, occasional PVC  2-D echocardiogram 04/24/2012: Study Conclusions  - Left ventricle: The cavity size was normal. Wall thickness was increased in a pattern of mild LVH. Systolic function was normal. The estimated ejection fraction was in the range  of 50% to 55%. Wall motion was normal; there were no regional wall motion abnormalities. - Aortic valve: There was very mild stenosis. - Mitral valve: Calcified annulus. Mild regurgitation. - Left atrium: The atrium was moderately to severely dilated. - Right atrium: The atrium was moderately to severely dilated. - Atrial septum: No defect or patent foramen ovale was identified. - Tricuspid valve: Moderate regurgitation. - Pulmonary arteries: PA peak pressure: 39mm Hg (S).  Myoview scan 04/30/2012: QPS  Raw Data  Images: Normal; no motion artifact; normal heart/lung ratio.  Stress Images: There is mild apical thinning with normal uptake in other regions.  Rest Images: There is mild apical thinning with normal uptake in other regions.  Subtraction (SDS): No evidence of ischemia.  Transient Ischemic Dilatation (Normal <1.22): 1.07  Lung/Heart Ratio (Normal <0.45): 0.33  Quantitative Gated Spect Images  QGS EDV: n/a  QGS ESV: n/a  Impression  Exercise Capacity: Lexiscan with no exercise.  BP Response: Normal blood pressure response.  Clinical Symptoms: No chest pain.  ECG Impression: No significant ST segment change suggestive of ischemia.  Comparison with Prior Nuclear Study: No images to compare  Overall Impression: Normal stress nuclear study.  LV Ejection Fraction: Study not gated. LV Wall Motion: Study not gated   ASSESSMENT AND PLAN: 1. Permanent atrial fibrillation. Heart rate control is adequate. The patient is anticoagulated with pradaxa. He has had no recurrence of stroke or TIA symptoms. Will continue current medical treatment.  2. Chronic dyspnea. Suspect multifactorial. He has a remote smoking history but quit about 30 years ago. I do not appreciate wheezing or other exam findings of COPD. He does not have typical signs or symptoms of congestive heart failure. Atrial fibrillation may contribute to a degree. I think a pulmonary evaluation would be reasonable, but he would like to undergo neurologic evaluation first. He will contact us if he would like to referral to the pulmonary clinic.  3. Hypertension. Blood pressure is well-controlled on low-dose amlodipine  4. Carotid stenosis. The patient has 60-79% bilateral ICA stenosis. His recent duplex scan was reviewed. This is been stable over time and will be repeated in one year. He is on aspirin 81 mg and a statin drug.  [name] 02/19/2013 8:27 AM

## 2013-02-19 NOTE — Patient Instructions (Addendum)
Your physician wants you to follow-up in: 1 year with Dr. Cooper.  You will receive a reminder letter in the mail two months in advance. If you don't receive a letter, please call our office to schedule the follow-up appointment.  Your physician recommends that you continue on your current medications as directed. Please refer to the Current Medication list given to you today.  

## 2013-04-05 ENCOUNTER — Other Ambulatory Visit: Payer: Self-pay | Admitting: Cardiovascular Disease

## 2013-11-07 ENCOUNTER — Other Ambulatory Visit (HOSPITAL_COMMUNITY): Payer: Self-pay | Admitting: *Deleted

## 2013-11-07 DIAGNOSIS — I6529 Occlusion and stenosis of unspecified carotid artery: Secondary | ICD-10-CM

## 2013-11-28 ENCOUNTER — Other Ambulatory Visit: Payer: Self-pay | Admitting: Cardiovascular Disease

## 2013-12-10 ENCOUNTER — Ambulatory Visit (HOSPITAL_COMMUNITY): Payer: Medicare Other | Attending: Internal Medicine | Admitting: Cardiology

## 2013-12-10 DIAGNOSIS — Z8673 Personal history of transient ischemic attack (TIA), and cerebral infarction without residual deficits: Secondary | ICD-10-CM | POA: Diagnosis not present

## 2013-12-10 DIAGNOSIS — E785 Hyperlipidemia, unspecified: Secondary | ICD-10-CM | POA: Diagnosis not present

## 2013-12-10 DIAGNOSIS — E119 Type 2 diabetes mellitus without complications: Secondary | ICD-10-CM | POA: Insufficient documentation

## 2013-12-10 DIAGNOSIS — I10 Essential (primary) hypertension: Secondary | ICD-10-CM | POA: Insufficient documentation

## 2013-12-10 DIAGNOSIS — I6529 Occlusion and stenosis of unspecified carotid artery: Secondary | ICD-10-CM

## 2013-12-10 DIAGNOSIS — I6523 Occlusion and stenosis of bilateral carotid arteries: Secondary | ICD-10-CM

## 2013-12-10 NOTE — Progress Notes (Signed)
Carotid duplex performed 

## 2013-12-29 ENCOUNTER — Other Ambulatory Visit: Payer: Self-pay | Admitting: Cardiovascular Disease

## 2014-02-12 ENCOUNTER — Inpatient Hospital Stay (HOSPITAL_COMMUNITY)
Admission: EM | Admit: 2014-02-12 | Discharge: 2014-02-16 | DRG: 194 | Disposition: A | Discharge status: Home Health | Payer: Medicare Other | Attending: Internal Medicine | Admitting: Internal Medicine

## 2014-02-12 ENCOUNTER — Encounter (HOSPITAL_COMMUNITY): Payer: Self-pay | Admitting: Adult Health

## 2014-02-12 ENCOUNTER — Emergency Department (HOSPITAL_COMMUNITY): Payer: Medicare Other

## 2014-02-12 DIAGNOSIS — E780 Pure hypercholesterolemia: Secondary | ICD-10-CM | POA: Diagnosis present

## 2014-02-12 DIAGNOSIS — G8929 Other chronic pain: Secondary | ICD-10-CM | POA: Diagnosis present

## 2014-02-12 DIAGNOSIS — I482 Chronic atrial fibrillation, unspecified: Secondary | ICD-10-CM

## 2014-02-12 DIAGNOSIS — I10 Essential (primary) hypertension: Secondary | ICD-10-CM | POA: Diagnosis present

## 2014-02-12 DIAGNOSIS — T380X5A Adverse effect of glucocorticoids and synthetic analogues, initial encounter: Secondary | ICD-10-CM | POA: Diagnosis present

## 2014-02-12 DIAGNOSIS — J219 Acute bronchiolitis, unspecified: Secondary | ICD-10-CM | POA: Diagnosis present

## 2014-02-12 DIAGNOSIS — Z79899 Other long term (current) drug therapy: Secondary | ICD-10-CM | POA: Diagnosis not present

## 2014-02-12 DIAGNOSIS — H9190 Unspecified hearing loss, unspecified ear: Secondary | ICD-10-CM | POA: Diagnosis present

## 2014-02-12 DIAGNOSIS — E1165 Type 2 diabetes mellitus with hyperglycemia: Secondary | ICD-10-CM | POA: Diagnosis present

## 2014-02-12 DIAGNOSIS — Z7982 Long term (current) use of aspirin: Secondary | ICD-10-CM

## 2014-02-12 DIAGNOSIS — M549 Dorsalgia, unspecified: Secondary | ICD-10-CM | POA: Diagnosis present

## 2014-02-12 DIAGNOSIS — I4891 Unspecified atrial fibrillation: Secondary | ICD-10-CM | POA: Diagnosis present

## 2014-02-12 DIAGNOSIS — Z7901 Long term (current) use of anticoagulants: Secondary | ICD-10-CM | POA: Diagnosis not present

## 2014-02-12 DIAGNOSIS — M1990 Unspecified osteoarthritis, unspecified site: Secondary | ICD-10-CM | POA: Diagnosis present

## 2014-02-12 DIAGNOSIS — I4821 Permanent atrial fibrillation: Secondary | ICD-10-CM | POA: Diagnosis present

## 2014-02-12 DIAGNOSIS — A419 Sepsis, unspecified organism: Secondary | ICD-10-CM

## 2014-02-12 DIAGNOSIS — J189 Pneumonia, unspecified organism: Principal | ICD-10-CM | POA: Diagnosis present

## 2014-02-12 DIAGNOSIS — N390 Urinary tract infection, site not specified: Secondary | ICD-10-CM | POA: Diagnosis present

## 2014-02-12 DIAGNOSIS — R509 Fever, unspecified: Secondary | ICD-10-CM

## 2014-02-12 DIAGNOSIS — Z8673 Personal history of transient ischemic attack (TIA), and cerebral infarction without residual deficits: Secondary | ICD-10-CM

## 2014-02-12 DIAGNOSIS — E119 Type 2 diabetes mellitus without complications: Secondary | ICD-10-CM

## 2014-02-12 LAB — BASIC METABOLIC PANEL
ANION GAP: 11 (ref 5–15)
BUN: 19 mg/dL (ref 6–23)
CHLORIDE: 100 meq/L (ref 96–112)
CO2: 22 mmol/L (ref 19–32)
Calcium: 9.3 mg/dL (ref 8.4–10.5)
Creatinine, Ser: 1.17 mg/dL (ref 0.50–1.35)
GFR calc Af Amer: 64 mL/min — ABNORMAL LOW (ref >90)
GFR calc non Af Amer: 55 mL/min — ABNORMAL LOW (ref >90)
Glucose, Bld: 266 mg/dL — ABNORMAL HIGH (ref 70–99)
Potassium: 4.4 mmol/L (ref 3.5–5.1)
Sodium: 133 mmol/L — ABNORMAL LOW (ref 135–145)

## 2014-02-12 LAB — URINALYSIS, ROUTINE W REFLEX MICROSCOPIC
Bilirubin Urine: NEGATIVE
Glucose, UA: 500 mg/dL — AB
KETONES UR: NEGATIVE mg/dL
Leukocytes, UA: NEGATIVE
NITRITE: NEGATIVE
PROTEIN: 100 mg/dL — AB
SPECIFIC GRAVITY, URINE: 1.02 (ref 1.005–1.030)
Urobilinogen, UA: 1 mg/dL (ref 0.0–1.0)
pH: 6 (ref 5.0–8.0)

## 2014-02-12 LAB — I-STAT TROPONIN, ED: TROPONIN I, POC: 0.03 ng/mL (ref 0.00–0.08)

## 2014-02-12 LAB — URINE MICROSCOPIC-ADD ON

## 2014-02-12 LAB — CBC WITH DIFFERENTIAL/PLATELET
Basophils Absolute: 0 10*3/uL (ref 0.0–0.1)
Basophils Relative: 0 % (ref 0–1)
Eosinophils Absolute: 0 10*3/uL (ref 0.0–0.7)
Eosinophils Relative: 0 % (ref 0–5)
HCT: 35.3 % — ABNORMAL LOW (ref 39.0–52.0)
Hemoglobin: 12.1 g/dL — ABNORMAL LOW (ref 13.0–17.0)
Lymphocytes Relative: 5 % — ABNORMAL LOW (ref 12–46)
Lymphs Abs: 0.7 10*3/uL (ref 0.7–4.0)
MCH: 29.6 pg (ref 26.0–34.0)
MCHC: 34.3 g/dL (ref 30.0–36.0)
MCV: 86.3 fL (ref 78.0–100.0)
Monocytes Absolute: 1.3 10*3/uL — ABNORMAL HIGH (ref 0.1–1.0)
Monocytes Relative: 10 % (ref 3–12)
NEUTROS ABS: 10.6 10*3/uL — AB (ref 1.7–7.7)
NEUTROS PCT: 85 % — AB (ref 43–77)
PLATELETS: 175 10*3/uL (ref 150–400)
RBC: 4.09 MIL/uL — AB (ref 4.22–5.81)
RDW: 13.3 % (ref 11.5–15.5)
WBC: 12.5 10*3/uL — AB (ref 4.0–10.5)

## 2014-02-12 LAB — I-STAT CG4 LACTIC ACID, ED: LACTIC ACID, VENOUS: 2.63 mmol/L — AB (ref 0.5–2.2)

## 2014-02-12 MED ORDER — DEXTROSE 5 % IV SOLN
1.0000 g | Freq: Once | INTRAVENOUS | Status: AC
  Administered 2014-02-12: 1 g via INTRAVENOUS
  Filled 2014-02-12: qty 10

## 2014-02-12 MED ORDER — SODIUM CHLORIDE 0.9 % IV BOLUS (SEPSIS)
1000.0000 mL | Freq: Once | INTRAVENOUS | Status: AC
  Administered 2014-02-12: 1000 mL via INTRAVENOUS

## 2014-02-12 MED ORDER — ACETAMINOPHEN 325 MG PO TABS
650.0000 mg | ORAL_TABLET | Freq: Four times a day (QID) | ORAL | Status: DC | PRN
  Administered 2014-02-12 – 2014-02-13 (×2): 650 mg via ORAL
  Filled 2014-02-12 (×2): qty 2

## 2014-02-12 MED ORDER — DEXTROSE 5 % IV SOLN
500.0000 mg | Freq: Once | INTRAVENOUS | Status: AC
  Administered 2014-02-12: 500 mg via INTRAVENOUS
  Filled 2014-02-12: qty 500

## 2014-02-12 NOTE — ED Provider Notes (Signed)
CSN: [id number]     Arrival date & time 02/12/14  2016 History   First MD Initiated Contact with Patient 02/12/14 2046     Chief Complaint  Patient presents with  . Tachycardia  . Fever  . Urinary Frequency     (Consider location/radiation/quality/duration/timing/severity/associated sxs/prior Treatment) HPI Initial symptoms of upper respiratory infection, he reports that he thought he had a cold for the first couple of days of illness. However over the past 48 hours he has had increasing shortness of breath and cough. The patient is not aware of having had any fever at home. He was seen in [place] yesterday and diagnosed with bronchitis. The symptoms however have been worsening.  Past Medical History  Diagnosis Date  . Hypertension   . Hypercholesterolemia   . Stroke   . Osteoarthritis   . Diabetes mellitus     typeII  . Chronic back pain   . Hx of cardiovascular stress test     Lex MV 3/14:  Not gated, apical thinning, no ischemia.   Marland Kitchen. Hx of echocardiogram     Echo 04/24/12: mild LVH, EF 50-55%, mild AS (mean 9), MAC, mild MR, mod-severe BAE, mod TR, PASP 39  . Atrial fibrillation    Past Surgical History  Procedure Laterality Date  . Laminectomy      decompressive  . Knee surgery      left   Family History  Problem Relation Age of Onset  . Other      no family hx of coronary artery disease  . Other Father [id number]    died   History  Substance Use Topics  . Smoking status: Never Smoker   . Smokeless tobacco: Not on file  . Alcohol Use: No    Review of Systems 10 Systems reviewed and are negative for acute change except as noted in the HPI.   Allergies  Review of patient's allergies indicates no known allergies.  Home Medications   Prior to Admission medications   Medication Sig Start Date End Date Taking? Authorizing Provider  amLODipine (NORVASC) 2.5 MG tablet Take 1 tablet by mouth daily. 02/13/13  Yes Historical Provider, MD  aspirin EC 81 MG tablet Take 1  tablet (81 mg total) by mouth daily. 12/06/11  Yes Micheline ChapmanMichael D Cooper, MD  cefdinir (OMNICEF) 300 MG capsule Take 300 mg by mouth 2 (two) times daily. 02/12/14  Yes Historical Provider, MD  Cyanocobalamin (VITAMIN B 12 PO) Take 500 mg by mouth 2 (two) times daily.   Yes Historical Provider, MD  glimepiride (AMARYL) 4 MG tablet Take 4 mg by mouth 2 (two) times daily. 01/17/14  Yes Historical Provider, MD  lisinopril (PRINIVIL,ZESTRIL) 20 MG tablet Take 20 mg by mouth daily. 01/17/14  Yes Historical Provider, MD  metFORMIN (GLUCOPHAGE-XR) 500 MG 24 hr tablet Take 1,000 mg by mouth 2 (two) times daily.     Yes Historical Provider, MD  neomycin-polymyxin-hydrocortisone (CORTISPORIN) otic solution Place 2-5 drops in ear(s) 2 (two) times daily before lunch and supper.   Yes Historical Provider, MD  PRADAXA 150 MG CAPS capsule TAKE ONE CAPSULE BY MOUTH EVERY 12 HOURS 12/31/13  Yes Micheline ChapmanMichael D Cooper, MD  pravastatin (PRAVACHOL) 80 MG tablet Take 80 mg by mouth daily. 01/30/14  Yes Historical Provider, MD   BP 117/62 mmHg  Pulse 118  Temp(Src) 98.8 F (37.1 C) (Oral)  Resp 22  Ht 6' (1.829 m)  Wt 215 lb (97.523 kg)  BMI 29.15 kg/m2  SpO2 96% Physical  Exam  Constitutional: He is oriented to person, place, and time.  The patient is mildly ill in appearance. He is alert and answering questions appropriately. The patient is not in acute respiratory distress.  HENT:  Head: Normocephalic and atraumatic.  Eyes: EOM are normal. Pupils are equal, round, and reactive to light.  Neck: Neck supple.  Cardiovascular: Normal rate, regular rhythm and intact distal pulses.   Tachycardic  Pulmonary/Chest: Effort normal. No respiratory distress. He has no wheezes. He has rales.  Abdominal: Soft. Bowel sounds are normal. He exhibits no distension. There is no tenderness.  Musculoskeletal: Normal range of motion. He exhibits no edema.  Neurological: He is alert and oriented to person, place, and time. He has normal  strength. Coordination normal. GCS eye subscore is 4. GCS verbal subscore is 5. GCS motor subscore is 6.  Skin: Skin is warm, dry and intact.  Psychiatric: He has a normal mood and affect.    ED Course  Procedures (including critical care time) Labs Review Labs Reviewed  CBC WITH DIFFERENTIAL - Abnormal; Notable for the following:    WBC 12.5 (*)    RBC 4.09 (*)    Hemoglobin 12.1 (*)    HCT 35.3 (*)    Neutrophils Relative % 85 (*)    Neutro Abs 10.6 (*)    Lymphocytes Relative 5 (*)    Monocytes Absolute 1.3 (*)    All other components within normal limits  BASIC METABOLIC PANEL - Abnormal; Notable for the following:    Sodium 133 (*)    Glucose, Bld 266 (*)    GFR calc non Af Amer 55 (*)    GFR calc Af Amer 64 (*)    All other components within normal limits  URINALYSIS, ROUTINE W REFLEX MICROSCOPIC - Abnormal; Notable for the following:    APPearance CLOUDY (*)    Glucose, UA 500 (*)    Hgb urine dipstick SMALL (*)    Protein, ur 100 (*)    All other components within normal limits  I-STAT CG4 LACTIC ACID, ED - Abnormal; Notable for the following:    Lactic Acid, Venous 2.63 (*)    All other components within normal limits  CULTURE, BLOOD (ROUTINE X 2)  CULTURE, BLOOD (ROUTINE X 2)  URINE CULTURE  URINE MICROSCOPIC-ADD ON  BRAIN NATRIURETIC PEPTIDE  I-STAT TROPOININ, ED    Imaging Review Dg Chest Port 1 View  02/12/2014   CLINICAL DATA:  Cough, pneumonia and shortness of breath for 48 hr. Tachycardia, fever.  EXAM: PORTABLE CHEST - 1 VIEW  COMPARISON:  Chest radiograph February 11, 2014  FINDINGS: Cardiac silhouette appears mildly enlarged. Mediastinal silhouette is nonsuspicious, calcified aortic knob. Increasing patchy RIGHT middle lobe and retrocardiac airspace opacities. No pleural effusions. No pneumothorax. Soft tissue planes and included osseous structures are nonsuspicious.  IMPRESSION: Mildly increasing RIGHT middle lobe, retrocardiac atelectasis or early  pneumonia. Recommend followup chest radiograph after treatment to verify improvement.   Electronically Signed   By: Awilda Metroourtnay  Bloomer   On: 02/12/2014 21:26     EKG Interpretation None     CRITICAL CARE Performed by: Arby BarrettePfeiffer, Jenay Morici   Total critical care time: 40  Critical care time was exclusive of separately billable procedures and treating other patients.  Critical care was necessary to treat or prevent imminent or life-threatening deterioration.  Critical care was time spent personally by me on the following activities: development of treatment plan with patient and/or surrogate as well as nursing, discussions with consultants,  evaluation of patient's response to treatment, examination of patient, obtaining history from patient or surrogate, ordering and performing treatments and interventions, ordering and review of laboratory studies, ordering and review of radiographic studies, pulse oximetry and re-evaluation of patient's condition. MDM   Final diagnoses:  Community acquired pneumonia  Sepsis, due to unspecified organism   Patient has fever to 101.3. Chest x-ray shows positive infiltrate. The patient presented tachycardic. At this point in time findings are consistent with sepsis. His mental status is alert and appropriate. The patient does not show signs of impending respiratory failure. The patient we admitted for further amp patient therapy for community-acquired pneumonia.    Arby Barrette, MD 02/12/14 [phone]

## 2014-02-12 NOTE — Progress Notes (Signed)
Pt came in with possible PNA. Code sepsis was called. Discussed with Dr. Donnald GarrePfeiffer, will give first dose of zithromax/rocephin.   Zithromax 500mg  IV x1 Rocephin 1g IV x1  Ulyses SouthwardMinh Chucky Homes, PharmD Pager: [phone] 02/12/2014 9:33 PM

## 2014-02-12 NOTE — ED Notes (Signed)
MD at bedside. 

## 2014-02-12 NOTE — ED Notes (Addendum)
Presents from home-lives alone, with onset incontinence, fever, SOB and tachycardia for 48 hours. Pt is very warm to touch and incontinent of urine, ORal temp 98.3 here, has intermittent confusion and endorses feeling confused. Lives alone. Sons are at bedside.  He was seen at [facility] last night and DX with bronchitis, he has been worsening today-becoming nmore unsteady on feet and having difficulty with urination.

## 2014-02-13 DIAGNOSIS — E119 Type 2 diabetes mellitus without complications: Secondary | ICD-10-CM

## 2014-02-13 DIAGNOSIS — N390 Urinary tract infection, site not specified: Secondary | ICD-10-CM | POA: Diagnosis present

## 2014-02-13 DIAGNOSIS — I10 Essential (primary) hypertension: Secondary | ICD-10-CM | POA: Diagnosis present

## 2014-02-13 DIAGNOSIS — J189 Pneumonia, unspecified organism: Secondary | ICD-10-CM | POA: Diagnosis present

## 2014-02-13 DIAGNOSIS — J219 Acute bronchiolitis, unspecified: Secondary | ICD-10-CM | POA: Diagnosis present

## 2014-02-13 LAB — COMPREHENSIVE METABOLIC PANEL
ALT: 16 U/L (ref 0–53)
AST: 15 U/L (ref 0–37)
Albumin: 3.1 g/dL — ABNORMAL LOW (ref 3.5–5.2)
Alkaline Phosphatase: 63 U/L (ref 39–117)
Anion gap: 10 (ref 5–15)
BUN: 14 mg/dL (ref 6–23)
CALCIUM: 8.8 mg/dL (ref 8.4–10.5)
CO2: 19 mmol/L (ref 19–32)
Chloride: 102 mEq/L (ref 96–112)
Creatinine, Ser: 0.94 mg/dL (ref 0.50–1.35)
GFR calc non Af Amer: 74 mL/min — ABNORMAL LOW (ref >90)
GFR, EST AFRICAN AMERICAN: 86 mL/min — AB (ref >90)
GLUCOSE: 277 mg/dL — AB (ref 70–99)
Potassium: 3.8 mmol/L (ref 3.5–5.1)
SODIUM: 131 mmol/L — AB (ref 135–145)
Total Bilirubin: 1.2 mg/dL (ref 0.3–1.2)
Total Protein: 6.5 g/dL (ref 6.0–8.3)

## 2014-02-13 LAB — CBC WITH DIFFERENTIAL/PLATELET
BASOS ABS: 0 10*3/uL (ref 0.0–0.1)
Basophils Relative: 0 % (ref 0–1)
Eosinophils Absolute: 0 10*3/uL (ref 0.0–0.7)
Eosinophils Relative: 0 % (ref 0–5)
HCT: 35.3 % — ABNORMAL LOW (ref 39.0–52.0)
Hemoglobin: 12.3 g/dL — ABNORMAL LOW (ref 13.0–17.0)
LYMPHS ABS: 0.5 10*3/uL — AB (ref 0.7–4.0)
Lymphocytes Relative: 4 % — ABNORMAL LOW (ref 12–46)
MCH: 30.6 pg (ref 26.0–34.0)
MCHC: 34.8 g/dL (ref 30.0–36.0)
MCV: 87.8 fL (ref 78.0–100.0)
MONOS PCT: 6 % (ref 3–12)
Monocytes Absolute: 0.8 10*3/uL (ref 0.1–1.0)
NEUTROS PCT: 90 % — AB (ref 43–77)
Neutro Abs: 11.8 10*3/uL — ABNORMAL HIGH (ref 1.7–7.7)
Platelets: 162 10*3/uL (ref 150–400)
RBC: 4.02 MIL/uL — AB (ref 4.22–5.81)
RDW: 13.3 % (ref 11.5–15.5)
WBC: 13.1 10*3/uL — ABNORMAL HIGH (ref 4.0–10.5)

## 2014-02-13 LAB — INFLUENZA PANEL BY PCR (TYPE A & B)
H1N1 flu by pcr: NOT DETECTED
INFLBPCR: NEGATIVE
Influenza A By PCR: NEGATIVE

## 2014-02-13 LAB — GLUCOSE, CAPILLARY
GLUCOSE-CAPILLARY: 319 mg/dL — AB (ref 70–99)
Glucose-Capillary: 266 mg/dL — ABNORMAL HIGH (ref 70–99)
Glucose-Capillary: 317 mg/dL — ABNORMAL HIGH (ref 70–99)
Glucose-Capillary: 380 mg/dL — ABNORMAL HIGH (ref 70–99)
Glucose-Capillary: 369 mg/dL — ABNORMAL HIGH (ref 70–99)

## 2014-02-13 LAB — BRAIN NATRIURETIC PEPTIDE: B Natriuretic Peptide: 272 pg/mL — ABNORMAL HIGH (ref 0.0–100.0)

## 2014-02-13 LAB — STREP PNEUMONIAE URINARY ANTIGEN: STREP PNEUMO URINARY ANTIGEN: NEGATIVE

## 2014-02-13 MED ORDER — HYDROCOD POLST-CHLORPHEN POLST 10-8 MG/5ML PO LQCR
5.0000 mL | Freq: Once | ORAL | Status: AC
  Administered 2014-02-13: 5 mL via ORAL
  Filled 2014-02-13: qty 5

## 2014-02-13 MED ORDER — METOPROLOL TARTRATE 1 MG/ML IV SOLN
5.0000 mg | Freq: Once | INTRAVENOUS | Status: AC
  Administered 2014-02-13: 5 mg via INTRAVENOUS
  Filled 2014-02-13: qty 5

## 2014-02-13 MED ORDER — GUAIFENESIN ER 600 MG PO TB12
600.0000 mg | ORAL_TABLET | Freq: Two times a day (BID) | ORAL | Status: DC
  Administered 2014-02-13 – 2014-02-16 (×8): 600 mg via ORAL
  Filled 2014-02-13 (×9): qty 1

## 2014-02-13 MED ORDER — IPRATROPIUM-ALBUTEROL 0.5-2.5 (3) MG/3ML IN SOLN
3.0000 mL | Freq: Four times a day (QID) | RESPIRATORY_TRACT | Status: DC
  Administered 2014-02-13: 3 mL via RESPIRATORY_TRACT
  Filled 2014-02-13: qty 3

## 2014-02-13 MED ORDER — PRAVASTATIN SODIUM 80 MG PO TABS
80.0000 mg | ORAL_TABLET | Freq: Every day | ORAL | Status: DC
  Administered 2014-02-13 – 2014-02-15 (×3): 80 mg via ORAL
  Filled 2014-02-13 (×4): qty 1

## 2014-02-13 MED ORDER — PHENAZOPYRIDINE HCL 100 MG PO TABS
100.0000 mg | ORAL_TABLET | Freq: Three times a day (TID) | ORAL | Status: DC
  Administered 2014-02-13 – 2014-02-15 (×9): 100 mg via ORAL
  Filled 2014-02-13 (×11): qty 1

## 2014-02-13 MED ORDER — METHYLPREDNISOLONE SODIUM SUCC 125 MG IJ SOLR
60.0000 mg | Freq: Four times a day (QID) | INTRAMUSCULAR | Status: DC
  Administered 2014-02-13 – 2014-02-14 (×5): 60 mg via INTRAVENOUS
  Filled 2014-02-13 (×2): qty 0.96
  Filled 2014-02-13: qty 2
  Filled 2014-02-13 (×6): qty 0.96

## 2014-02-13 MED ORDER — IPRATROPIUM-ALBUTEROL 0.5-2.5 (3) MG/3ML IN SOLN
3.0000 mL | RESPIRATORY_TRACT | Status: DC
  Administered 2014-02-13 (×3): 3 mL via RESPIRATORY_TRACT
  Filled 2014-02-13 (×4): qty 3

## 2014-02-13 MED ORDER — INSULIN ASPART 100 UNIT/ML ~~LOC~~ SOLN
0.0000 [IU] | Freq: Every day | SUBCUTANEOUS | Status: DC
  Administered 2014-02-13: 4 [IU] via SUBCUTANEOUS

## 2014-02-13 MED ORDER — INSULIN ASPART 100 UNIT/ML ~~LOC~~ SOLN
0.0000 [IU] | Freq: Three times a day (TID) | SUBCUTANEOUS | Status: DC
  Administered 2014-02-13: 11 [IU] via SUBCUTANEOUS
  Administered 2014-02-13: 15 [IU] via SUBCUTANEOUS
  Administered 2014-02-13: 11 [IU] via SUBCUTANEOUS
  Administered 2014-02-14: 15 [IU] via SUBCUTANEOUS

## 2014-02-13 MED ORDER — PHENOL 1.4 % MT LIQD
1.0000 | OROMUCOSAL | Status: DC | PRN
  Administered 2014-02-13 – 2014-02-14 (×4): 1 via OROMUCOSAL
  Filled 2014-02-13: qty 177

## 2014-02-13 MED ORDER — ALBUTEROL SULFATE (2.5 MG/3ML) 0.083% IN NEBU: 2.5000 mg | INHALATION_SOLUTION | Freq: Three times a day (TID) | RESPIRATORY_TRACT | Status: DC

## 2014-02-13 MED ORDER — CHLORPROMAZINE HCL 25 MG PO TABS
25.0000 mg | ORAL_TABLET | Freq: Once | ORAL | Status: AC
  Administered 2014-02-13: 25 mg via ORAL
  Filled 2014-02-13: qty 1

## 2014-02-13 MED ORDER — ALBUTEROL SULFATE (2.5 MG/3ML) 0.083% IN NEBU
2.5000 mg | INHALATION_SOLUTION | Freq: Three times a day (TID) | RESPIRATORY_TRACT | Status: DC
Start: 2014-02-13 — End: 2014-02-14
  Administered 2014-02-14: 2.5 mg via RESPIRATORY_TRACT
  Filled 2014-02-13: qty 3

## 2014-02-13 MED ORDER — SODIUM CHLORIDE 0.9 % IV SOLN: INTRAVENOUS | Status: DC

## 2014-02-13 MED ORDER — ASPIRIN EC 81 MG PO TBEC
81.0000 mg | DELAYED_RELEASE_TABLET | Freq: Every day | ORAL | Status: DC
  Administered 2014-02-13 – 2014-02-16 (×4): 81 mg via ORAL
  Filled 2014-02-13 (×4): qty 1

## 2014-02-13 MED ORDER — LEVOFLOXACIN IN D5W 750 MG/150ML IV SOLN
750.0000 mg | INTRAVENOUS | Status: DC
  Administered 2014-02-13 – 2014-02-15 (×3): 750 mg via INTRAVENOUS
  Filled 2014-02-13 (×3): qty 150

## 2014-02-13 MED ORDER — DABIGATRAN ETEXILATE MESYLATE 150 MG PO CAPS
150.0000 mg | ORAL_CAPSULE | Freq: Two times a day (BID) | ORAL | Status: DC
Start: 2014-02-13 — End: 2014-02-16
  Administered 2014-02-13 – 2014-02-16 (×7): 150 mg via ORAL
  Filled 2014-02-13 (×9): qty 1

## 2014-02-13 MED ORDER — LISINOPRIL 20 MG PO TABS
20.0000 mg | ORAL_TABLET | Freq: Every day | ORAL | Status: DC
  Administered 2014-02-13 – 2014-02-16 (×4): 20 mg via ORAL
  Filled 2014-02-13 (×4): qty 1

## 2014-02-13 MED ORDER — GUAIFENESIN-DM 100-10 MG/5ML PO SYRP
5.0000 mL | ORAL_SOLUTION | ORAL | Status: DC | PRN
  Administered 2014-02-13: 5 mL via ORAL
  Filled 2014-02-13 (×4): qty 5

## 2014-02-13 NOTE — Progress Notes (Signed)
Patient admitted after midnight, please see H&P.  Feeling better already Await flu screen- did not have flu shot this year  Marlin Canary DO [phone]

## 2014-02-13 NOTE — Progress Notes (Signed)
ANTIBIOTIC CONSULT NOTE - INITIAL  Pharmacy Consult for Levaquin  Indication: pneumonia  No Known Allergies  Patient Measurements: Height: 6' (182.9 cm) Weight: 214 lb 8.1 oz (97.3 kg) IBW/kg (Calculated) : 77.6 Adjusted Body Weight: n/a   Vital Signs: Temp: 99.7 F (37.6 C) (01/01 0108) Temp Source: Rectal (01/01 0104) BP: 145/87 mmHg (01/01 0108) Pulse Rate: 90 (01/01 0108) Intake/Output from previous day: 12/31 0701 - 01/01 0700 In: 1300 [I.V.:1250; IV Piggyback:50] Out: 200 [Urine:200] Intake/Output from this shift: Total I/O In: 1300 [I.V.:1250; IV Piggyback:50] Out: 200 [Urine:200]  Labs:  Recent Labs  02/12/14 2033  WBC 12.5*  HGB 12.1*  PLT 175  CREATININE 1.17   Estimated Creatinine Clearance: 55.8 mL/min (by C-G formula based on Cr of 1.17). No results for input(s): VANCOTROUGH, VANCOPEAK, VANCORANDOM, GENTTROUGH, GENTPEAK, GENTRANDOM, TOBRATROUGH, TOBRAPEAK, TOBRARND, AMIKACINPEAK, AMIKACINTROU, AMIKACIN in the last 72 hours.   Microbiology: No results found for this or any previous visit (from the past 720 hour(s)).  Medical History: Past Medical History  Diagnosis Date  . Hypertension   . Hypercholesterolemia   . Stroke   . Osteoarthritis   . Diabetes mellitus     typeII  . Chronic back pain   . Hx of cardiovascular stress test     Lex MV 3/14:  Not gated, apical thinning, no ischemia.   Marland Kitchen Hx of echocardiogram     Echo 04/24/12: mild LVH, EF 50-55%, mild AS (mean 9), MAC, mild MR, mod-severe BAE, mod TR, PASP 39  . Atrial fibrillation     Medications:  Prescriptions prior to admission  Medication Sig Dispense Refill Last Dose  . amLODipine (NORVASC) 2.5 MG tablet Take 1 tablet by mouth daily.   02/11/2014  . aspirin EC 81 MG tablet Take 1 tablet (81 mg total) by mouth daily. 30 tablet 12 02/11/2014  . cefdinir (OMNICEF) 300 MG capsule Take 300 mg by mouth 2 (two) times daily.   02/11/2014  . Cyanocobalamin (VITAMIN B 12 PO) Take 500 mg by  mouth 2 (two) times daily.   02/11/2014  . glimepiride (AMARYL) 4 MG tablet Take 4 mg by mouth 2 (two) times daily.   02/11/2014  . lisinopril (PRINIVIL,ZESTRIL) 20 MG tablet Take 20 mg by mouth daily.   02/11/2014  . metFORMIN (GLUCOPHAGE-XR) 500 MG 24 hr tablet Take 1,000 mg by mouth 2 (two) times daily.     02/11/2014  . neomycin-polymyxin-hydrocortisone (CORTISPORIN) otic solution Place 2-5 drops in ear(s) 2 (two) times daily before lunch and supper.   02/11/2014  . PRADAXA 150 MG CAPS capsule TAKE ONE CAPSULE BY MOUTH EVERY 12 HOURS 60 capsule 2 02/11/2014  . pravastatin (PRAVACHOL) 80 MG tablet Take 80 mg by mouth daily.   02/11/2014   Assessment: 87 YOM with upper respiratory tract infection started on Levaquin. CXR shows positive infiltrates and patient has fever up to 101.3 F. WBC elevated at 12.5. CrCl ~ 56 mL/min.   Cultures: 12/31 Blood Cx >>  12/31 Urine Cx >>   Goal of Therapy:  Resolution of infection   Plan:  -Continue Levaquin 750 mg IV Q 24 hours.  -Monitor CBC, renal fx, cultures and patient's clinical progress   Vinnie Level, PharmD., BCPS Clinical Pharmacist Pager [phone]

## 2014-02-13 NOTE — Progress Notes (Signed)
Pt admitted to the unit. Pt is alert and oriented. Pt oriented to room, staff, and call bell. Bed in lowest position. Full assessment to Epic. Call bell with in reach. Told to call for assists. Will continue to monitor.  Alpha Chouinard E  

## 2014-02-13 NOTE — H&P (Signed)
[facility] History and Physical  Patient: Frank Marks  ZOX:[id number]  DOB: Nov 03, 1926  DOS: the patient was seen and examined on 02/13/2014 PCP: Darnelle Bos, MD  Chief Complaint: Shortness of breath and cough  HPI: Frank Marks is a 81 y.o. male with Past medical history of hypertension, CVA, diabetes mellitus, A. fib. The patient is presenting with complaints of shortness of breath and cough that has been ongoing for last few days. He denies any recent travel or recent sick contact. He initially started with a runny nose and congestion and later on started having complaints of cough. He developed fever for which she was seen in [place] ER and was diagnosed with bronchitis and his chest x-ray was not showing any pneumonia. He was started on Cefdinir and sent home. Despite taking Ceftin year his symptoms were not improving and he was getting shortness of breath even at rest and therefore he was brought here. Patient has been having some wheezing as well. Denies any prior history of asthma COPD or smoking. Denies any prior history of recurrent pneumonia.  The patient is coming from home. And at his baseline independent for most of his ADL.  Review of Systems: as mentioned in the history of present illness.  A Comprehensive review of the other systems is negative.  Past Medical History  Diagnosis Date  . Hypertension   . Hypercholesterolemia   . Stroke   . Osteoarthritis   . Diabetes mellitus     typeII  . Chronic back pain   . Hx of cardiovascular stress test     Lex MV 3/14:  Not gated, apical thinning, no ischemia.   Marland Kitchen Hx of echocardiogram     Echo 04/24/12: mild LVH, EF 50-55%, mild AS (mean 9), MAC, mild MR, mod-severe BAE, mod TR, PASP 39  . Atrial fibrillation    Past Surgical History  Procedure Laterality Date  . Laminectomy      decompressive  . Knee surgery      left   Social History:  reports that he has never smoked. He does not have any  smokeless tobacco history on file. He reports that he does not drink alcohol or use illicit drugs.  No Known Allergies  Family History  Problem Relation Age of Onset  . Other      no family hx of coronary artery disease  . Other Father 56    died    Prior to Admission medications   Medication Sig Start Date End Date Taking? Authorizing Provider  amLODipine (NORVASC) 2.5 MG tablet Take 1 tablet by mouth daily. 02/13/13  Yes Historical Provider, MD  aspirin EC 81 MG tablet Take 1 tablet (81 mg total) by mouth daily. 12/06/11  Yes Micheline Chapman, MD  cefdinir (OMNICEF) 300 MG capsule Take 300 mg by mouth 2 (two) times daily. 02/12/14  Yes Historical Provider, MD  Cyanocobalamin (VITAMIN B 12 PO) Take 500 mg by mouth 2 (two) times daily.   Yes Historical Provider, MD  glimepiride (AMARYL) 4 MG tablet Take 4 mg by mouth 2 (two) times daily. 01/17/14  Yes Historical Provider, MD  lisinopril (PRINIVIL,ZESTRIL) 20 MG tablet Take 20 mg by mouth daily. 01/17/14  Yes Historical Provider, MD  metFORMIN (GLUCOPHAGE-XR) 500 MG 24 hr tablet Take 1,000 mg by mouth 2 (two) times daily.     Yes Historical Provider, MD  neomycin-polymyxin-hydrocortisone (CORTISPORIN) otic solution Place 2-5 drops in ear(s) 2 (two) times daily before lunch and supper.  Yes Historical Provider, MD  PRADAXA 150 MG CAPS capsule TAKE ONE CAPSULE BY MOUTH EVERY 12 HOURS 12/31/13  Yes Micheline Chapman, MD  pravastatin (PRAVACHOL) 80 MG tablet Take 80 mg by mouth daily. 01/30/14  Yes Historical Provider, MD    Physical Exam: Filed Vitals:   02/13/14 0044 02/13/14 0104 02/13/14 0108 02/13/14 0359  BP: 140/71  145/87   Pulse:   90   Temp:  99.7 F (37.6 C) 99.7 F (37.6 C) 100.7 F (38.2 C)  TempSrc:  Rectal  Rectal  Resp: 20  20   Height:   6' (1.829 m)   Weight:   97.3 kg (214 lb 8.1 oz)   SpO2: 96%  98%     General: Alert, Awake and Oriented to Time, Place and Person. Appear in mild distress Eyes: PERRL ENT: Oral  Mucosa clear moisty. Neck: Mild JVD Cardiovascular: S1 and S2 Present, aortic systolic Murmur, Peripheral Pulses Present Respiratory: Bilateral Air entry equal and Decreased, bilateral Crackles, extensive expiratory wheezes Abdomen: Bowel Sound present, Soft and non tender Skin: no Rash Extremities: no Pedal edema, no calf tenderness Neurologic: Grossly no focal neuro deficit.  Labs on Admission:  CBC:  Recent Labs Lab 02/12/14 2033  WBC 12.5*  NEUTROABS 10.6*  HGB 12.1*  HCT 35.3*  MCV 86.3  PLT 175    CMP     Component Value Date/Time   NA 133* 02/12/2014 2033   K 4.4 02/12/2014 2033   CL 100 02/12/2014 2033   CO2 22 02/12/2014 2033   GLUCOSE 266* 02/12/2014 2033   BUN 19 02/12/2014 2033   CREATININE 1.17 02/12/2014 2033   CALCIUM 9.3 02/12/2014 2033   GFRNONAA 55* 02/12/2014 2033   GFRAA 64* 02/12/2014 2033    No results for input(s): LIPASE, AMYLASE in the last 168 hours. No results for input(s): AMMONIA in the last 168 hours.  No results for input(s): CKTOTAL, CKMB, CKMBINDEX, TROPONINI in the last 168 hours. BNP (last 3 results) No results for input(s): PROBNP in the last 8760 hours.  Radiological Exams on Admission: Dg Chest Port 1 View  02/12/2014   CLINICAL DATA:  Cough, pneumonia and shortness of breath for 48 hr. Tachycardia, fever.  EXAM: PORTABLE CHEST - 1 VIEW  COMPARISON:  Chest radiograph February 11, 2014  FINDINGS: Cardiac silhouette appears mildly enlarged. Mediastinal silhouette is nonsuspicious, calcified aortic knob. Increasing patchy RIGHT middle lobe and retrocardiac airspace opacities. No pleural effusions. No pneumothorax. Soft tissue planes and included osseous structures are nonsuspicious.  IMPRESSION: Mildly increasing RIGHT middle lobe, retrocardiac atelectasis or early pneumonia. Recommend followup chest radiograph after treatment to verify improvement.   Electronically Signed   By: Awilda Metro   On: 02/12/2014 21:26    EKG:  Independently reviewed. atrial fibrillation, RVR.  Assessment/Plan Principal Problem:   CAP (community acquired pneumonia) Active Problems:   Atrial fibrillation   Essential hypertension   UTI (urinary tract infection)   Acute bronchiolitis   Diabetes mellitus type 2 in nonobese   1. CAP (community acquired pneumonia) The patient is presenting with complaints of cough shortness of breath fever. He is found to have leukocytosis, fever, tachycardia as well as tachypnea. On exam he has bilateral crackles as well as bilateral expiratory wheezes. This suggest possible acute bronchiolitis along with community acquired pneumonia. At present I will broaden his spectrum levofloxacin. I would given duo nebs, Mucinex, flutter device, and Solu-Medrol. Check urine for streptococcal and legionella antigen as well as possible influenza PCR.  Follow sputum culture and blood culture. He also appeared to have possible UTI with increasing urinary frequency. Therefore follow urine culture as well.  2. A. fib, RVR Patient's heart rate mildly elevated. Patient is not on any rate control medication. He continues to have elevated heart rate he may require interventional Cardizem. With elevated BNP is just to have possible diastolic dysfunction. Limited echo cardiogram in the morning.  3. Diabetes mellitus. Holding oral hypoglycemic agent and placing the patient on sliding scale.  4. Essential hypertension. Stopping amlodipine, continuing lisinopril. May require Cardizem.  Advance goals of care discussion: Full code, patient's son who is the power of attorney will verify later from patient's living will.  DVT Prophylaxis: on chronic anticoagulation Nutrition: Cardiac and diabetic diet  Family Communication: Son was present at bedside, opportunity was given to ask question and all questions were answered satisfactorily at the time of interview. Disposition: Admitted to inpatient in telemetry  unit.  Author: Lynden Oxford, MD [facility] Pager: [phone] 02/13/2014, 5:08 AM    If 7PM-7AM, please contact night-coverage [email or web address] Password [email or web address]

## 2014-02-13 NOTE — Progress Notes (Signed)
Attempted to call report. Phone number left with Diplomatic Services operational officer.

## 2014-02-13 NOTE — Progress Notes (Signed)
Pt HR 150 non sustaining. HR then 121. On call provider notified,  metoprolol IV ordered. Will continue to monitor.

## 2014-02-14 LAB — GLUCOSE, CAPILLARY
Glucose-Capillary: 364 mg/dL — ABNORMAL HIGH (ref 70–99)
Glucose-Capillary: 457 mg/dL — ABNORMAL HIGH (ref 70–99)
Glucose-Capillary: 288 mg/dL — ABNORMAL HIGH (ref 70–99)
Glucose-Capillary: 349 mg/dL — ABNORMAL HIGH (ref 70–99)

## 2014-02-14 LAB — CBC
HCT: 33.6 % — ABNORMAL LOW (ref 39.0–52.0)
HEMOGLOBIN: 11.7 g/dL — AB (ref 13.0–17.0)
MCH: 30.2 pg (ref 26.0–34.0)
MCHC: 34.8 g/dL (ref 30.0–36.0)
MCV: 86.8 fL (ref 78.0–100.0)
PLATELETS: 178 10*3/uL (ref 150–400)
RBC: 3.87 MIL/uL — ABNORMAL LOW (ref 4.22–5.81)
RDW: 13.2 % (ref 11.5–15.5)
WBC: 14.4 10*3/uL — ABNORMAL HIGH (ref 4.0–10.5)

## 2014-02-14 LAB — GLUCOSE, RANDOM: GLUCOSE: 468 mg/dL — AB (ref 70–99)

## 2014-02-14 LAB — BASIC METABOLIC PANEL
ANION GAP: 9 (ref 5–15)
BUN: 24 mg/dL — AB (ref 6–23)
CO2: 22 mmol/L (ref 19–32)
CREATININE: 1.22 mg/dL (ref 0.50–1.35)
Calcium: 8.9 mg/dL (ref 8.4–10.5)
Chloride: 100 mEq/L (ref 96–112)
GFR calc Af Amer: 61 mL/min — ABNORMAL LOW (ref >90)
GFR calc non Af Amer: 52 mL/min — ABNORMAL LOW (ref >90)
Glucose, Bld: 371 mg/dL — ABNORMAL HIGH (ref 70–99)
POTASSIUM: 4.6 mmol/L (ref 3.5–5.1)
Sodium: 131 mmol/L — ABNORMAL LOW (ref 135–145)

## 2014-02-14 MED ORDER — METOCLOPRAMIDE HCL 5 MG/ML IJ SOLN
5.0000 mg | Freq: Four times a day (QID) | INTRAMUSCULAR | Status: DC | PRN
  Filled 2014-02-14: qty 1

## 2014-02-14 MED ORDER — SODIUM CHLORIDE 0.9 % IV SOLN
INTRAVENOUS | Status: AC
  Administered 2014-02-14: 600 mL via INTRAVENOUS

## 2014-02-14 MED ORDER — METOPROLOL TARTRATE 1 MG/ML IV SOLN
5.0000 mg | Freq: Once | INTRAVENOUS | Status: DC
  Filled 2014-02-14: qty 5

## 2014-02-14 MED ORDER — METHYLPREDNISOLONE SODIUM SUCC 125 MG IJ SOLR
60.0000 mg | Freq: Two times a day (BID) | INTRAMUSCULAR | Status: DC
  Administered 2014-02-14 – 2014-02-15 (×2): 60 mg via INTRAVENOUS
  Filled 2014-02-14: qty 2
  Filled 2014-02-14 (×2): qty 0.96

## 2014-02-14 MED ORDER — INSULIN ASPART 100 UNIT/ML ~~LOC~~ SOLN
0.0000 [IU] | Freq: Every day | SUBCUTANEOUS | Status: DC
  Administered 2014-02-14: 4 [IU] via SUBCUTANEOUS

## 2014-02-14 MED ORDER — POLYETHYLENE GLYCOL 3350 17 G PO PACK
17.0000 g | PACK | Freq: Every day | ORAL | Status: DC
Start: 2014-02-14 — End: 2014-02-16
  Administered 2014-02-14 – 2014-02-16 (×3): 17 g via ORAL
  Filled 2014-02-14 (×3): qty 1

## 2014-02-14 MED ORDER — INSULIN GLARGINE 100 UNIT/ML ~~LOC~~ SOLN
20.0000 [IU] | Freq: Every day | SUBCUTANEOUS | Status: DC
  Administered 2014-02-14 – 2014-02-16 (×3): 20 [IU] via SUBCUTANEOUS
  Filled 2014-02-14 (×3): qty 0.2

## 2014-02-14 MED ORDER — INSULIN ASPART 100 UNIT/ML ~~LOC~~ SOLN
0.0000 [IU] | Freq: Three times a day (TID) | SUBCUTANEOUS | Status: DC
  Administered 2014-02-14: 11 [IU] via SUBCUTANEOUS
  Administered 2014-02-14 – 2014-02-15 (×2): 20 [IU] via SUBCUTANEOUS
  Administered 2014-02-15: 15 [IU] via SUBCUTANEOUS
  Administered 2014-02-15: 11 [IU] via SUBCUTANEOUS
  Administered 2014-02-16: 7 [IU] via SUBCUTANEOUS

## 2014-02-14 MED ORDER — ALBUTEROL SULFATE (2.5 MG/3ML) 0.083% IN NEBU: 2.5000 mg | INHALATION_SOLUTION | Freq: Four times a day (QID) | RESPIRATORY_TRACT | Status: DC | PRN

## 2014-02-14 NOTE — Progress Notes (Addendum)
PROGRESS NOTE  Frank Marks EXB:[id number] DOB: 10/10/1926 DOA: 02/12/2014 PCP: Darnelle Bos, MD  Assessment/Plan: CAP (community acquired pneumonia) -levofloxacin. -duo nebs, Mucinex, flutter device, and Solu-Medrol- wean as tolerated Flu negative; strep negative Follow sputum culture and blood culture.  ?UTI -follow culture  A. Fib -episodes of fast rate requiring IV lopressor Average rate < 100  Diabetes mellitus. Holding oral hypoglycemic agent and placing the patient on sliding scale. -BS high due to steroids- wean quickly   Essential hypertension. Continue home meds  Code Status: full Family Communication: patient, no family at bedside today- called randy, no answer Disposition Plan: PT Eval   Consultants:    Procedures:      HPI/Subjective: Feeling better Wants to go  Home tomm  Objective: Filed Vitals:   02/14/14 0511  BP: 124/81  Pulse: 72  Temp: 97.8 F (36.6 C)  Resp: 20    Intake/Output Summary (Last 24 hours) at 02/14/14 0801 Last data filed at 02/14/14 0514  Gross per 24 hour  Intake   1510 ml  Output   3900 ml  Net  -2390 ml   Filed Weights   02/12/14 2031 02/13/14 0108 02/14/14 0511  Weight: 97.523 kg (215 lb) 97.3 kg (214 lb 8.1 oz) 97 kg (213 lb 13.5 oz)    Exam:   General:  Pleasant/cooperative  Cardiovascular: rrr  Respiratory: no wheezing, clearer today  Abdomen: +BS, soft  Musculoskeletal: moves all 4 ext   Data Reviewed: Basic Metabolic Panel:  Recent Labs Lab 02/12/14 2033 02/13/14 0620 02/14/14 0032  NA 133* 131* 131*  K 4.4 3.8 4.6  CL 100 102 100  CO2 GLUCOSE 266* 277* 371*  BUN 19 14 24*  CREATININE 1.17 0.94 1.22  CALCIUM 9.3 8.8 8.9   Liver Function Tests:  Recent Labs Lab 02/13/14 0620  AST 15  ALT 16  ALKPHOS 63  BILITOT 1.2  PROT 6.5  ALBUMIN 3.1*   No results for input(s): LIPASE, AMYLASE in the last 168 hours. No results for input(s): AMMONIA in the  last 168 hours. CBC:  Recent Labs Lab 02/12/14 2033 02/13/14 0620 02/14/14 0032  WBC 12.5* 13.1* 14.4*  NEUTROABS 10.6* 11.8*  --   HGB 12.1* 12.3* 11.7*  HCT 35.3* 35.3* 33.6*  MCV 86.3 87.8 86.8  PLT 175 162 178   Cardiac Enzymes: No results for input(s): CKTOTAL, CKMB, CKMBINDEX, TROPONINI in the last 168 hours. BNP (last 3 results) No results for input(s): PROBNP in the last 8760 hours. CBG:  Recent Labs Lab 02/13/14 0750 02/13/14 1147 02/13/14 1643 02/13/14 2107 02/14/14 0753  GLUCAP 317* 380* 319* 369* 364*    No results found for this or any previous visit (from the past 240 hour(s)).   Studies: Dg Chest Port 1 View  02/12/2014   CLINICAL DATA:  Cough, pneumonia and shortness of breath for 48 hr. Tachycardia, fever.  EXAM: PORTABLE CHEST - 1 VIEW  COMPARISON:  Chest radiograph February 11, 2014  FINDINGS: Cardiac silhouette appears mildly enlarged. Mediastinal silhouette is nonsuspicious, calcified aortic knob. Increasing patchy RIGHT middle lobe and retrocardiac airspace opacities. No pleural effusions. No pneumothorax. Soft tissue planes and included osseous structures are nonsuspicious.  IMPRESSION: Mildly increasing RIGHT middle lobe, retrocardiac atelectasis or early pneumonia. Recommend followup chest radiograph after treatment to verify improvement.   Electronically Signed   By: Awilda Metro   On: 02/12/2014 21:26    Scheduled Meds: . albuterol  2.5 mg Nebulization TID  .  aspirin EC  81 mg Oral Daily  . dabigatran  150 mg Oral Q12H  . guaiFENesin  600 mg Oral BID  . insulin aspart  0-15 Units Subcutaneous TID WC  . insulin aspart  0-5 Units Subcutaneous QHS  . levofloxacin (LEVAQUIN) IV  750 mg Intravenous Q24H  . lisinopril  20 mg Oral Daily  . methylPREDNISolone (SOLU-MEDROL) injection  60 mg Intravenous Q12H  . phenazopyridine  100 mg Oral TID WC  . pravastatin  80 mg Oral Daily   Continuous Infusions: . sodium chloride 600 mL (02/14/14 0751)    Antibiotics Given (last 72 hours)    Date/Time Action Medication Dose Rate   02/13/14 0404 Given   levofloxacin (LEVAQUIN) IVPB 750 mg 750 mg 100 mL/hr   02/14/14 0314 Given   levofloxacin (LEVAQUIN) IVPB 750 mg 750 mg 100 mL/hr      Principal Problem:   CAP (community acquired pneumonia) Active Problems:   Atrial fibrillation   Essential hypertension   UTI (urinary tract infection)   Acute bronchiolitis   Diabetes mellitus type 2 in nonobese    Time spent: 35 min    VANN, JESSICA  [facility] Pager [phone] If 7PM-7AM, please contact night-coverage at [email or web address], password [facility] 02/14/2014, 8:01 AM  LOS: 2 days

## 2014-02-14 NOTE — Progress Notes (Signed)
Pt CBG 457 MD made aware.

## 2014-02-14 NOTE — Progress Notes (Signed)
Pt HR 150 non sustaining. HR then 130. On call provider notified,  metoprolol IV ordered. Will continue to monitor

## 2014-02-15 LAB — GLUCOSE, CAPILLARY
GLUCOSE-CAPILLARY: 175 mg/dL — AB (ref 70–99)
GLUCOSE-CAPILLARY: 330 mg/dL — AB (ref 70–99)
GLUCOSE-CAPILLARY: 369 mg/dL — AB (ref 70–99)
Glucose-Capillary: 282 mg/dL — ABNORMAL HIGH (ref 70–99)
Glucose-Capillary: 202 mg/dL — ABNORMAL HIGH (ref 70–99)

## 2014-02-15 LAB — URINE CULTURE
Colony Count: NO GROWTH
Culture: NO GROWTH

## 2014-02-15 LAB — LEGIONELLA ANTIGEN, URINE

## 2014-02-15 LAB — HEMOGLOBIN A1C
HEMOGLOBIN A1C: 6.9 % — AB (ref <5.7)
MEAN PLASMA GLUCOSE: 151 mg/dL — AB (ref <117)

## 2014-02-15 MED ORDER — AMLODIPINE BESYLATE 2.5 MG PO TABS
2.5000 mg | ORAL_TABLET | Freq: Every day | ORAL | Status: DC
  Filled 2014-02-15: qty 1

## 2014-02-15 MED ORDER — GLIMEPIRIDE 4 MG PO TABS
4.0000 mg | ORAL_TABLET | Freq: Two times a day (BID) | ORAL | Status: DC
  Administered 2014-02-15 – 2014-02-16 (×3): 4 mg via ORAL
  Filled 2014-02-15 (×4): qty 1

## 2014-02-15 MED ORDER — LEVOFLOXACIN 750 MG PO TABS
750.0000 mg | ORAL_TABLET | ORAL | Status: DC
  Administered 2014-02-16: 750 mg via ORAL
  Filled 2014-02-15 (×2): qty 1

## 2014-02-15 MED ORDER — DILTIAZEM HCL 30 MG PO TABS
30.0000 mg | ORAL_TABLET | Freq: Two times a day (BID) | ORAL | Status: DC
  Administered 2014-02-15 – 2014-02-16 (×3): 30 mg via ORAL
  Filled 2014-02-15 (×4): qty 1

## 2014-02-15 MED ORDER — HYDRALAZINE HCL 20 MG/ML IJ SOLN
10.0000 mg | Freq: Four times a day (QID) | INTRAMUSCULAR | Status: DC | PRN
  Administered 2014-02-15: 10 mg via INTRAVENOUS
  Filled 2014-02-15: qty 1

## 2014-02-15 MED ORDER — LEVOFLOXACIN 750 MG PO TABS
750.0000 mg | ORAL_TABLET | Freq: Every day | ORAL | Status: DC
  Filled 2014-02-15: qty 1

## 2014-02-15 MED ORDER — PREDNISONE 20 MG PO TABS
40.0000 mg | ORAL_TABLET | Freq: Every day | ORAL | Status: DC
  Administered 2014-02-15 – 2014-02-16 (×2): 40 mg via ORAL
  Filled 2014-02-15 (×3): qty 2

## 2014-02-15 MED ORDER — METFORMIN HCL ER 500 MG PO TB24
1000.0000 mg | ORAL_TABLET | Freq: Two times a day (BID) | ORAL | Status: DC
  Administered 2014-02-15 – 2014-02-16 (×3): 1000 mg via ORAL
  Filled 2014-02-15 (×5): qty 2

## 2014-02-15 NOTE — Progress Notes (Signed)
Physical Therapy Evaluation Patient Details Name: Frank Marks MRN: [id number] DOB: 23-Jul-1926 Today's Date: 02/15/2014   History of Present Illness  Patient is an 81 yo male admitted 02/12/14 with CAP, UTI, and Afib.   PMH:  DM, CVA, HTN, HOH, Bil TKA, Lt THA (per patient), back surgery  Clinical Impression  Patient presents with problems listed below.  Will benefit from acute PT to maximize independence prior to discharge home.  Patient currently requires use of RW for gait - not using one pta.  Recommended patient use RW at all times for safety.  Recommend f/u HHPT for home safety evaluation and continued therapy.    Follow Up Recommendations Home health PT;Supervision - Intermittent    Equipment Recommendations  None recommended by PT    Recommendations for Other Services       Precautions / Restrictions Precautions Precautions: Fall Precaution Comments: Reports he has fallen 4 times recently - knees buckling.  States he got himself up x3, and his son had to help him up x1. Restrictions Weight Bearing Restrictions: No      Mobility  Bed Mobility Overal bed mobility: Modified Independent             General bed mobility comments: Patient able to move to sitting with use of bed rail and increased time.  Moves slowly.     Transfers Overall transfer level: Needs assistance Equipment used: Rolling walker (2 wheeled) Transfers: Sit to/from Stand Sit to Stand: Min guard         General transfer comment: Verbal cues for hand placement.  Assist to rise to standing and for balance initially.    Ambulation/Gait Ambulation/Gait assistance: Supervision Ambulation Distance (Feet): 110 Feet Assistive device: Rolling walker (2 wheeled) Gait Pattern/deviations: Step-through pattern;Decreased stride length;Decreased stance time - left;Decreased step length - right;Decreased weight shift to left;Trunk flexed Gait velocity: Decreased Gait velocity interpretation: Below  normal speed for age/gender General Gait Details: Verbal cues for safe use of RW.  Patient with flexed posture - cues to stand upright and look forward during gait.  Patient with slow gait speed.  Stairs            Wheelchair Mobility    Modified Rankin (Stroke Patients Only)       Balance                                             Pertinent Vitals/Pain Pain Assessment: No/denies pain    Home Living Family/patient expects to be discharged to:: Private residence Living Arrangements: Alone Available Help at Discharge: Family;Personal care attendant;Available PRN/intermittently;Friend(s) (Aide 3 hours/day; Son and grandson live near. Friend helps.) Type of Home: House Home Access: Stairs to enter Entrance Stairs-Rails: Right;Left Entrance Stairs-Number of Steps: 5 Home Layout: One level Home Equipment: Walker - 2 wheels;Cane - single point;Shower seat      Prior Function Level of Independence: Independent with assistive device(s);Needs assistance   Gait / Transfers Assistance Needed: Uses cane at times for gait.  ADL's / Homemaking Assistance Needed: Aide helps with meals, housekeeping, laundry.  Patient independent with bathing.        Hand Dominance        Extremity/Trunk Assessment   Upper Extremity Assessment: Overall WFL for tasks assessed           Lower Extremity Assessment: Generalized weakness  Communication   Communication: HOH (Wears aids - not working well)  Cognition Arousal/Alertness: Awake/alert Behavior During Therapy: WFL for tasks assessed/performed Overall Cognitive Status: Within Functional Limits for tasks assessed                      General Comments      Exercises        Assessment/Plan    PT Assessment Patient needs continued PT services  PT Diagnosis Difficulty walking;Generalized weakness   PT Problem List Decreased strength;Decreased activity tolerance;Decreased  balance;Decreased mobility;Decreased knowledge of use of DME;Cardiopulmonary status limiting activity  PT Treatment Interventions DME instruction;Gait training;Stair training;Functional mobility training;Therapeutic activities;Patient/family education   PT Goals (Current goals can be found in the Care Plan section) Acute Rehab PT Goals Patient Stated Goal: To go home tomorrow PT Goal Formulation: With patient Time For Goal Achievement: 02/22/14 Potential to Achieve Goals: Good    Frequency Min 3X/week   Barriers to discharge Decreased caregiver support Does not have 24 hour assist.  Has prn assist via Aide, family and friends for most of the day.    Co-evaluation               End of Session Equipment Utilized During Treatment: Gait belt Activity Tolerance: Patient limited by fatigue Patient left: in bed;with call bell/phone within reach Nurse Communication: Mobility status         Time: 4098-1191 PT Time Calculation (min) (ACUTE ONLY): 14 min   Charges:   PT Evaluation $Initial PT Evaluation Tier I: 1 Procedure PT Treatments $Gait Training: 8-22 mins   PT G CodesVena Austria February 19, 2014, 5:47 PM Durenda Hurt. Renaldo Fiddler, [facility] Acute Rehab Services Pager [phone]

## 2014-02-15 NOTE — Progress Notes (Signed)
PROGRESS NOTE  Frank Marks ZOX:[id number] DOB: [birth year] DOA: 02/12/2014 PCP: Darnelle Bos, MD  Assessment/Plan: CAP (community acquired pneumonia) -levofloxacin- change to PO -duo nebs, Mucinex, flutter device, and Solu-Medrol- wean quickly a blood sugars are elevated Flu negative; strep negative Follow sputum culture and blood culture- NGTD  ?UTI -follow culture  A. Fib -add cardizem for fast HR   Diabetes mellitus. Resume home meds Added lantus for while on steroids -BS high due to steroids- wean quickly   Essential hypertension. Continue home meds  Code Status: full Family Communication: son [name] on phone Disposition Plan: PT Eval- suspect home in AM   Consultants:    Procedures:      HPI/Subjective: Hear aids not working, very hard of hearing  Objective: Filed Vitals:   02/15/14 0741  BP: 129/97  Pulse:   Temp:   Resp:     Intake/Output Summary (Last 24 hours) at 02/15/14 0904 Last data filed at 02/15/14 0800  Gross per 24 hour  Intake 1536.67 ml  Output   2875 ml  Net -1338.33 ml   Filed Weights   02/13/14 0108 02/14/14 0511 02/15/14 0500  Weight: 97.3 kg (214 lb 8.1 oz) 97 kg (213 lb 13.5 oz) 96.525 kg (212 lb 12.8 oz)    Exam:   General:  Pleasant/cooperative  Cardiovascular: rrr  Respiratory: no wheezing, moving more air  Abdomen: +BS, soft  Musculoskeletal: moves all 4 ext   Data Reviewed: Basic Metabolic Panel:  Recent Labs Lab 02/12/14 2033 02/13/14 0620 02/14/14 0032 02/14/14 1246  NA 133* 131* 131*  --   K 4.4 3.8 4.6  --   CL 100 102 100  --   CO2 --   GLUCOSE 266* 277* 371* 468*  BUN 19 14 24*  --   CREATININE 1.17 0.94 1.22  --   CALCIUM 9.3 8.8 8.9  --    Liver Function Tests:  Recent Labs Lab 02/13/14 0620  AST 15  ALT 16  ALKPHOS 63  BILITOT 1.2  PROT 6.5  ALBUMIN 3.1*   No results for input(s): LIPASE, AMYLASE in the last 168 hours. No results for input(s):  AMMONIA in the last 168 hours. CBC:  Recent Labs Lab 02/12/14 2033 02/13/14 0620 02/14/14 0032  WBC 12.5* 13.1* 14.4*  NEUTROABS 10.6* 11.8*  --   HGB 12.1* 12.3* 11.7*  HCT 35.3* 35.3* 33.6*  MCV 86.3 87.8 86.8  PLT 175 162 178   Cardiac Enzymes: No results for input(s): CKTOTAL, CKMB, CKMBINDEX, TROPONINI in the last 168 hours. BNP (last 3 results) No results for input(s): PROBNP in the last 8760 hours. CBG:  Recent Labs Lab 02/14/14 0753 02/14/14 1203 02/14/14 1710 02/14/14 2215 02/15/14 0744  GLUCAP 364* 457* 288* 349* 330*    No results found for this or any previous visit (from the past 240 hour(s)).   Studies: No results found.  Scheduled Meds: . amLODipine  2.5 mg Oral Daily  . aspirin EC  81 mg Oral Daily  . dabigatran  150 mg Oral Q12H  . glimepiride  4 mg Oral BID  . guaiFENesin  600 mg Oral BID  . insulin aspart  0-20 Units Subcutaneous TID WC  . insulin aspart  0-5 Units Subcutaneous QHS  . insulin glargine  20 Units Subcutaneous Daily  . levofloxacin (LEVAQUIN) IV  750 mg Intravenous Q24H  . lisinopril  20 mg Oral Daily  . metFORMIN  1,000 mg Oral BID WC  . phenazopyridine  100 mg Oral TID WC  . polyethylene glycol  17 g Oral Daily  . pravastatin  80 mg Oral Daily  . predniSONE  40 mg Oral Q breakfast   Continuous Infusions:   Antibiotics Given (last 72 hours)    Date/Time Action Medication Dose Rate   02/13/14 0404 Given   levofloxacin (LEVAQUIN) IVPB 750 mg 750 mg 100 mL/hr   02/14/14 0314 Given   levofloxacin (LEVAQUIN) IVPB 750 mg 750 mg 100 mL/hr   02/15/14 0329 Given   levofloxacin (LEVAQUIN) IVPB 750 mg 750 mg 100 mL/hr      Principal Problem:   CAP (community acquired pneumonia) Active Problems:   Atrial fibrillation   Essential hypertension   UTI (urinary tract infection)   Acute bronchiolitis   Diabetes mellitus type 2 in nonobese    Time spent: 25 min    Mykael Batz  [facility] Pager [phone] If  7PM-7AM, please contact night-coverage at [email or web address], password [facility] 02/15/2014, 9:04 AM  LOS: 3 days

## 2014-02-15 NOTE — Progress Notes (Signed)
Provider on call notified of HR in the 150's x 2, sustaining in high 120's.  Patient stable alert and oriented x 4, denies chest pains.  See orders.  Will monitor and evaluate at intervals.

## 2014-02-15 NOTE — Progress Notes (Signed)
Provider on call notified of high blood pressure, systolic highest at 170's, diastolic highest in the 100'.  PRN Hydralazine  IV ordered, given.  Will recheck and monitor bp trends.

## 2014-02-16 DIAGNOSIS — I10 Essential (primary) hypertension: Secondary | ICD-10-CM

## 2014-02-16 DIAGNOSIS — E119 Type 2 diabetes mellitus without complications: Secondary | ICD-10-CM

## 2014-02-16 DIAGNOSIS — I482 Chronic atrial fibrillation: Secondary | ICD-10-CM

## 2014-02-16 DIAGNOSIS — J189 Pneumonia, unspecified organism: Principal | ICD-10-CM

## 2014-02-16 LAB — GLUCOSE, CAPILLARY
GLUCOSE-CAPILLARY: 90 mg/dL (ref 70–99)
GLUCOSE-CAPILLARY: 214 mg/dL — AB (ref 70–99)

## 2014-02-16 MED ORDER — GUAIFENESIN ER 600 MG PO TB12: 600.0000 mg | ORAL_TABLET | Freq: Two times a day (BID) | ORAL | Status: DC

## 2014-02-16 MED ORDER — PREDNISONE 10 MG PO TABS: ORAL_TABLET | ORAL | Status: DC

## 2014-02-16 MED ORDER — LEVOFLOXACIN 750 MG PO TABS
750.0000 mg | ORAL_TABLET | ORAL | Status: DC
Start: 2014-02-16 — End: 2014-04-01

## 2014-02-16 MED ORDER — GUAIFENESIN-DM 100-10 MG/5ML PO SYRP: 5.0000 mL | ORAL_SOLUTION | ORAL | Status: DC | PRN

## 2014-02-16 MED ORDER — DILTIAZEM HCL 30 MG PO TABS: 30.0000 mg | ORAL_TABLET | Freq: Two times a day (BID) | ORAL | Status: DC

## 2014-02-16 MED ORDER — POLYETHYLENE GLYCOL 3350 17 G PO PACK: 17.0000 g | PACK | Freq: Every day | ORAL | Status: DC

## 2014-02-16 NOTE — Care Management Note (Signed)
    Page 1 of 1   02/16/2014     11:31:14 AM CARE MANAGEMENT NOTE 02/16/2014  Patient:  Frank Marks, Frank Marks   Account Number:  [id number]  Date Initiated:  02/16/2014  Documentation initiated by:  Letha Cape  Subjective/Objective Assessment:   dx pna  admit- lives alone.     Action/Plan:   pt eval- rec hhpt   Anticipated DC Date:  02/16/2014   Anticipated DC Plan:  HOME W HOME HEALTH SERVICES      DC Planning Services  CM consult      [facility] Choice  HOME HEALTH   Choice offered to / List presented to:  C-1 Patient        HH arranged  HH-1 RN  HH-2 PT  HH-3 OT      [facility] agency  [facility]   Status of service:  Completed, signed off Medicare Important Message given?  YES (If response is "NO", the following Medicare IM given date fields will be blank) Date Medicare IM given:  02/16/2014 Medicare IM given by:  Letha Cape Date Additional Medicare IM given:   Additional Medicare IM given by:    Discharge Disposition:  HOME W HOME HEALTH SERVICES  Per UR Regulation:  Reviewed for med. necessity/level of care/duration of stay  If discussed at Long Length of Stay Meetings, dates discussed:    Comments:  02/16/14 1129 Letha Cape RN, BSN [phone] patient is for dc today, he chose [facility], referral made to [facility] for [facility], PT, OT, Miranda notified. Soc will begin 24-48 hrs post dc.

## 2014-02-16 NOTE — Progress Notes (Signed)
NCM spoke with patient , he chose [facility] for hhpt, referral made to [facility] for hhpt, Miranda notified.  Soc will begin 24-48 hrs  Post dc.

## 2014-02-16 NOTE — Discharge Summary (Signed)
Physician Discharge Summary  Patient ID: BURRELL HODAPP MRN: [id number] DOB/AGE: 1926-10-27 81 y.o.  Admit date: 02/12/2014 Discharge date: 02/16/2014  Primary Care Physician:  Darnelle Bos, MD  Discharge Diagnoses:    . CAP (community acquired pneumonia) . Atrial fibrillation . Essential hypertension . UTI (urinary tract infection) . Acute bronchiolitis  Consults: None    Recommendations for Outpatient Follow-up:  Please check repeat chest x-ray in 2-3 weeks for complete resolution of the pneumonia  Please note blood sugars were elevated during hospitalization secondary to prednisone. Prednisone is being tapered now, please check CBGs upon follow-up appointment on 02/20/14, I did not feel comfortable sending the patient home with Lantus when prednisone is being tapered quickly outpatient. He will continue Amaryl and metformin.    DIET: Heart healthy card modified    Allergies:  No Known Allergies   Discharge Medications:   Medication List    STOP taking these medications        amLODipine 2.5 MG tablet  Commonly known as:  NORVASC     cefdinir 300 MG capsule  Commonly known as:  OMNICEF      TAKE these medications        aspirin EC 81 MG tablet  Take 1 tablet (81 mg total) by mouth daily.     diltiazem 30 MG tablet  Commonly known as:  CARDIZEM  Take 1 tablet (30 mg total) by mouth 2 (two) times daily.     glimepiride 4 MG tablet  Commonly known as:  AMARYL  Take 4 mg by mouth 2 (two) times daily.     guaiFENesin 600 MG 12 hr tablet  Commonly known as:  MUCINEX  Take 1 tablet (600 mg total) by mouth 2 (two) times daily.     guaiFENesin-dextromethorphan 100-10 MG/5ML syrup  Commonly known as:  ROBITUSSIN DM  Take 5 mLs by mouth every 4 (four) hours as needed for cough.     levofloxacin 750 MG tablet  Commonly known as:  LEVAQUIN  Take 1 tablet (750 mg total) by mouth daily.     lisinopril 20 MG tablet  Commonly known as:   PRINIVIL,ZESTRIL  Take 20 mg by mouth daily.     metFORMIN 500 MG 24 hr tablet  Commonly known as:  GLUCOPHAGE-XR  Take 1,000 mg by mouth 2 (two) times daily.     neomycin-polymyxin-hydrocortisone otic solution  Commonly known as:  CORTISPORIN  Place 2-5 drops in ear(s) 2 (two) times daily before lunch and supper.     polyethylene glycol packet  Commonly known as:  MIRALAX / GLYCOLAX  Take 17 g by mouth daily.     PRADAXA 150 MG Caps capsule  Generic drug:  dabigatran  TAKE ONE CAPSULE BY MOUTH EVERY 12 HOURS     pravastatin 80 MG tablet  Commonly known as:  PRAVACHOL  Take 80 mg by mouth daily.     predniSONE 10 MG tablet  Commonly known as:  DELTASONE  Prednisone dosing: Take  Prednisone  (3 tabs) x 2 days, then  (2 tabs) x 2days, then  (1 tab) x 2days, then OFF.  Start taking on:  02/17/2014     VITAMIN B 12 PO  Take 500 mg by mouth 2 (two) times daily.         Brief H and P: For complete details please refer to admission H and P, but in brief, patient is a 81 year old male with hypertension, CVA, diabetes, atrial fibrillation presented with shortness of breath  and coughing for the last few days. He denied any recent travel or recent sick contacts. Patient initially started with runny nose, congestion and later started having cough. He developed fever and was sitting in front of TV, was diagnosed with bronchitis. Chest x-ray did not show pneumonia. He was started on cefdinir and sent home. Despite taking the Ceftin his symptoms were not improving and patient presented to the ED here.  Hospital Course:     CAP (community acquired pneumonia) with acute bronchiolitis -Currently improving, patient was started on IV levofloxacin, duo nebs, Mucinex, flutter valve, Solu-Medrol. Influenza panel was negative, strep antigen negative. Blood cultures negative to date. Urine legionella antigen negative. Patient's blood sugars were quite elevated with IV Solu-Medrol, which  was transitioned to oral prednisone with quick taper. He will continue oral levofloxacin, follow up with his PCP on 02/20/14. Patient will need a repeat chest x-ray for ensuring complete resolution of pneumonia.    Atrial fibrillation - Cardizem was added due to tachycardia, continue Pradaxa    Essential hypertension - Currently stable    UTI (urinary tract infection): Urine culture showed no growth    Diabetes mellitus type 2 in nonobese Hemoglobin A1c 6.9, blood sugars were elevated during hospitalization due to IV steroids. Patient has been weaned off of IV steroids, started on oral prednisone with quick taper. He will continue Amaryl and metformin. He was not given any insulin upon discharge due to risk of hypoglycemia and patient's prednisone is being tapered already quickly. Please follow CBGs closely.  Day of Discharge BP 115/73 mmHg  Pulse 96  Temp(Src) 98.1 F (36.7 C) (Oral)  Resp 18  Ht 6' (1.829 m)  Wt 96.525 kg (212 lb 12.8 oz)  BMI 28.85 kg/m2  SpO2 97%  Physical Exam: General: Alert and awake oriented x3 not in any acute distress. CVS: S1-S2 clear no murmur rubs or gallops Chest: clear to auscultation bilaterally, no wheezing rales or rhonchi Abdomen: soft nontender, nondistended, normal bowel sounds Extremities: no cyanosis, clubbing or edema noted bilaterally Neuro: Cranial nerves II-XII intact, no focal neurological deficits   The results of significant diagnostics from this hospitalization (including imaging, microbiology, ancillary and laboratory) are listed below for reference.    LAB RESULTS: Basic Metabolic Panel:  Recent Labs Lab 02/13/14 0620 02/14/14 0032 02/14/14 1246  NA 131* 131*  --   K 3.8 4.6  --   CL 102 100  --   CO2 19 22  --   GLUCOSE 277* 371* 468*  BUN 14 24*  --   CREATININE 0.94 1.22  --   CALCIUM 8.8 8.9  --    Liver Function Tests:  Recent Labs Lab 02/13/14 0620  AST 15  ALT 16  ALKPHOS 63  BILITOT 1.2  PROT 6.5   ALBUMIN 3.1*   No results for input(s): LIPASE, AMYLASE in the last 168 hours. No results for input(s): AMMONIA in the last 168 hours. CBC:  Recent Labs Lab 02/13/14 0620 02/14/14 0032  WBC 13.1* 14.4*  NEUTROABS 11.8*  --   HGB 12.3* 11.7*  HCT 35.3* 33.6*  MCV 87.8 86.8  PLT 162 178   Cardiac Enzymes: No results for input(s): CKTOTAL, CKMB, CKMBINDEX, TROPONINI in the last 168 hours. BNP: Invalid input(s): POCBNP CBG:  Recent Labs Lab 02/16/14 0755 02/16/14 1119  GLUCAP 90 214*    Significant Diagnostic Studies:  Dg Chest Port 1 View  02/12/2014   CLINICAL DATA:  Cough, pneumonia and shortness of breath for 48  hr. Tachycardia, fever.  EXAM: PORTABLE CHEST - 1 VIEW  COMPARISON:  Chest radiograph February 11, 2014  FINDINGS: Cardiac silhouette appears mildly enlarged. Mediastinal silhouette is nonsuspicious, calcified aortic knob. Increasing patchy RIGHT middle lobe and retrocardiac airspace opacities. No pleural effusions. No pneumothorax. Soft tissue planes and included osseous structures are nonsuspicious.  IMPRESSION: Mildly increasing RIGHT middle lobe, retrocardiac atelectasis or early pneumonia. Recommend followup chest radiograph after treatment to verify improvement.   Electronically Signed   By: Awilda Metro   On: 02/12/2014 21:26     Disposition and Follow-up:     Discharge Instructions    Diet Carb Modified    Complete by:  As directed      Increase activity slowly    Complete by:  As directed             DISPOSITION:Home with home health PT, OT, RN follow-up  DISCHARGE FOLLOW-UP Follow-up Information    Follow up with Georgann Housekeeper, MD. Schedule an appointment as soon as possible for a visit on 02/20/2014.   Specialty:  Internal Medicine   Why:  for hospital follow-up/  Appointment with Dr. Donette Larry is on Feb 20, 2014 at 11:30am   Contact information:   [address], [address] [place] [address] [phone]       Follow up with  [facility]   Why:  hhpt,hhot, [facility]   Contact information:   [address] [place] [address] [phone]        Time spent on Discharge: 40 mins  Signed:   RAI,RIPUDEEP M.D. [facility] 02/16/2014, 11:49 AM Pager: [phone]

## 2014-02-16 NOTE — Progress Notes (Signed)
NURSING PROGRESS NOTE  Frank Marks [id number] Discharge Data: 02/16/2014 10:47 AM Attending Provider: Cathren Harsh, MD VOZ:[id number] [name], MD     [name] to be D/C'd Home per MD order.  Discussed with the patient the After Visit Summary and all questions fully answered. All IV's discontinued with no bleeding noted. All belongings returned to patient for patient to take home.   Last Vital Signs:  Blood pressure 115/73, pulse 96, temperature 98.1 F (36.7 C), temperature source Oral, resp. rate 18, height 6' (1.829 m), weight 96.525 kg (212 lb 12.8 oz), SpO2 97 %.  Discharge Medication List   Medication List    STOP taking these medications        amLODipine 2.5 MG tablet  Commonly known as:  NORVASC     cefdinir 300 MG capsule  Commonly known as:  OMNICEF      TAKE these medications        aspirin EC 81 MG tablet  Take 1 tablet (81 mg total) by mouth daily.     diltiazem 30 MG tablet  Commonly known as:  CARDIZEM  Take 1 tablet (30 mg total) by mouth 2 (two) times daily.     glimepiride 4 MG tablet  Commonly known as:  AMARYL  Take 4 mg by mouth 2 (two) times daily.     guaiFENesin 600 MG 12 hr tablet  Commonly known as:  MUCINEX  Take 1 tablet (600 mg total) by mouth 2 (two) times daily.     guaiFENesin-dextromethorphan 100-10 MG/5ML syrup  Commonly known as:  ROBITUSSIN DM  Take 5 mLs by mouth every 4 (four) hours as needed for cough.     levofloxacin 750 MG tablet  Commonly known as:  LEVAQUIN  Take 1 tablet (750 mg total) by mouth daily.     lisinopril 20 MG tablet  Commonly known as:  PRINIVIL,ZESTRIL  Take 20 mg by mouth daily.     metFORMIN 500 MG 24 hr tablet  Commonly known as:  GLUCOPHAGE-XR  Take 1,000 mg by mouth 2 (two) times daily.     neomycin-polymyxin-hydrocortisone otic solution  Commonly known as:  CORTISPORIN  Place 2-5 drops in ear(s) 2 (two) times daily before lunch and supper.     polyethylene glycol packet   Commonly known as:  MIRALAX / GLYCOLAX  Take 17 g by mouth daily.     PRADAXA 150 MG Caps capsule  Generic drug:  dabigatran  TAKE ONE CAPSULE BY MOUTH EVERY 12 HOURS     pravastatin 80 MG tablet  Commonly known as:  PRAVACHOL  Take 80 mg by mouth daily.     predniSONE 10 MG tablet  Commonly known as:  DELTASONE  Prednisone dosing: Take  Prednisone  (3 tabs) x 2 days, then  (2 tabs) x 2days, then  (1 tab) x 2days, then OFF.  Start taking on:  02/17/2014     VITAMIN B 12 PO  Take 500 mg by mouth 2 (two) times daily.

## 2014-02-16 NOTE — Progress Notes (Signed)
Physical Therapy Treatment Patient Details Name: Frank Marks MRN: [id number] DOB: 1926-03-25 Today's Date: 02/16/2014    History of Present Illness Patient is an 81 yo male admitted 02/12/14 with CAP, UTI, and Afib.   PMH:  DM, CVA, HTN, HOH, Bil TKA, Lt THA (per patient), back surgery    PT Comments    Pt demonstrating improved endurance and ability to negotiate stairs safely with min guard assist. Pt able to ambulate to stairs and back without loss of balance or becoming winded. Pt required consistent repeating of cuing for use of RW. Pt had tendency to push the RW too far out in front of him and lead to significant trunk flexion. Pt was educated on the importance of keeping the walker close and trying to stand up straighter to prevent falls.    Follow Up Recommendations  Home health PT;Supervision - Intermittent     Equipment Recommendations  None recommended by PT    Recommendations for Other Services       Precautions / Restrictions Precautions Precautions: Fall Precaution Comments: Reports he has fallen 4 times recently - knees buckling.  States he got himself up x3, and his son had to help him up x1. Restrictions Weight Bearing Restrictions: No    Mobility  Bed Mobility Overal bed mobility: Modified Independent             General bed mobility comments: Pt able to transfer to sitting with heavy use of bed rail and increased time. Pt did not require cuing or physical assistance from PT.   Transfers Overall transfer level: Needs assistance Equipment used: Rolling walker (2 wheeled) Transfers: Sit to/from Stand Sit to Stand: Min guard         General transfer comment: Pt requiring verbal cues for hand placement for safety and physical assist for stability while rising.   Ambulation/Gait Ambulation/Gait assistance: Min guard Ambulation Distance (Feet): 220 Feet Assistive device: Rolling walker (2 wheeled) Gait Pattern/deviations: Step-through  pattern;Decreased stride length;Trunk flexed Gait velocity: Decreased Gait velocity interpretation: Below normal speed for age/gender General Gait Details: Pt requiring repeated verbal cuing for use of RW. Pt had a tendency to lock his arms out and push RW too far out in front of him, and had flexed trunk. Pt stated standing up straight was difficult due to history of back pain but PT explained the importance of proper RW use for safety. Pt had no loss of balance and demonstrated improved endurance.    Stairs Stairs: Yes Stairs assistance: Min guard Stair Management: One rail Left;Step to pattern;Forwards Number of Stairs: 5 General stair comments: Pt requiring min guard assist to perform stair mobility using Left side rail. Pt with step-to pattern for ascending and descending and took his time for safety. Pt stated he felt comfortable being able to perform 4 stairs at home and that he never does stairs alone.   Wheelchair Mobility    Modified Rankin (Stroke Patients Only)       Balance Overall balance assessment: Needs assistance         Standing balance support: During functional activity;Bilateral upper extremity supported Standing balance-Leahy Scale: Fair Standing balance comment: Pt able to stand statically without RW but requires RW for safe dynamic mobility.                     Cognition Arousal/Alertness: Awake/alert Behavior During Therapy: WFL for tasks assessed/performed Overall Cognitive Status: Within Functional Limits for tasks assessed  Exercises      General Comments        Pertinent Vitals/Pain Pain Assessment: No/denies pain    Home Living                      Prior Function            PT Goals (current goals can now be found in the care plan section)      Frequency  Min 3X/week    PT Plan Current plan remains appropriate    Co-evaluation             End of Session Equipment Utilized  During Treatment: Gait belt Activity Tolerance: Patient tolerated treatment well Patient left: in chair;with call bell/phone within reach;with chair alarm set     Time: 1610-9604 PT Time Calculation (min) (ACUTE ONLY): 21 min  Charges:  $Gait Training: 8-22 mins                    G CodesYork Spaniel SPT 02/16/2014, 12:11 PM  York Spaniel, SPT  Acute Rehabilitation [phone]

## 2014-02-19 LAB — CULTURE, BLOOD (ROUTINE X 2)
Culture: NO GROWTH
Culture: NO GROWTH

## 2014-02-24 ENCOUNTER — Ambulatory Visit: Payer: Medicare Other | Admitting: Cardiovascular Disease

## 2014-03-10 ENCOUNTER — Other Ambulatory Visit: Payer: Self-pay

## 2014-03-10 MED ORDER — DABIGATRAN ETEXILATE MESYLATE 150 MG PO CAPS: ORAL_CAPSULE | ORAL | Status: DC

## 2014-04-01 ENCOUNTER — Ambulatory Visit (INDEPENDENT_AMBULATORY_CARE_PROVIDER_SITE_OTHER): Payer: Medicare Other | Admitting: Cardiovascular Disease

## 2014-04-01 ENCOUNTER — Encounter: Payer: Self-pay | Admitting: Cardiovascular Disease

## 2014-04-01 VITALS — BP 122/86 | HR 97 | Ht 72.0 in | Wt 209.1 lb

## 2014-04-01 DIAGNOSIS — R0600 Dyspnea, unspecified: Secondary | ICD-10-CM

## 2014-04-01 DIAGNOSIS — R251 Tremor, unspecified: Secondary | ICD-10-CM

## 2014-04-01 DIAGNOSIS — I350 Nonrheumatic aortic (valve) stenosis: Secondary | ICD-10-CM

## 2014-04-01 MED ORDER — DILTIAZEM HCL ER COATED BEADS 120 MG PO CP24: 120.0000 mg | ORAL_CAPSULE | Freq: Every day | ORAL | Status: DC

## 2014-04-01 NOTE — Patient Instructions (Addendum)
Your physician has recommended you make the following change in your medication:  1. STOP Cardizem 30mg  2. START Cardizem CD 120mg  take one by mouth daily  Your physician has requested that you have an echocardiogram. Echocardiography is a painless test that uses sound waves to create images of your heart. It provides your doctor with information about the size and shape of your heart and how well your heart's chambers and valves are working. This procedure takes approximately one hour. There are no restrictions for this procedure.  You have been referred to Dr Lurena Joinerebecca Tat with Neurology for further evaluation of tremors.   Your physician wants you to follow-up in: 1 YEAR with Dr Excell Seltzerooper.  You will receive a reminder letter in the mail two months in advance. If you don't receive a letter, please call our office to schedule the follow-up appointment.

## 2014-04-01 NOTE — Progress Notes (Signed)
Cardiology Office Note   Date:  04/01/2014   ID:  Frank PlantsClyde D Manahan, DOB [birth year], MRN [id number]  PCP:  Ailene RavelHAMRICK,MAURA L, MD  Cardiologist:  Tonny BollmanMichael Emmerson Shuffield, MD    Chief Complaint  Patient presents with  . Leg Swelling     History of Present Illness: Frank Marks is a 81 y.o. male who presents for follow-up of atrial fibrillation. The patient has a history of essential hypertension, chronic dyspnea, TIA, and carotid stenosis.  He was hospitalized a few months ago with community-acquired pneumonia. He's had generalized weakness ever since that time. He's also noted to have a resting tremor and some other symptoms that have concerned his family about possible Parkinson's disease. He has been evaluated by neurology and apparently Parkinson's was considered that he was not felt to meet diagnostic criteria.  They would like a second opinion.   The patient denies chest pain or heart palpitations. He continues to have some difficulty with exertional dyspnea. He denies orthopnea, PND, lightheadedness, or syncope. Mild chronic leg swelling is unchanged. He's been compliant with his medications.     Past Medical History  Diagnosis Date  . Hypertension   . Hypercholesterolemia   . Stroke   . Osteoarthritis   . Diabetes mellitus     typeII  . Chronic back pain   . Hx of cardiovascular stress test     Lex MV 3/14:  Not gated, apical thinning, no ischemia.   Marland Kitchen. Hx of echocardiogram     Echo 04/24/12: mild LVH, EF 50-55%, mild AS (mean 9), MAC, mild MR, mod-severe BAE, mod TR, PASP 39  . Atrial fibrillation     Past Surgical History  Procedure Laterality Date  . Laminectomy      decompressive  . Knee surgery      left    Current Outpatient Prescriptions  Medication Sig Dispense Refill  . aspirin EC 81 MG tablet Take 1 tablet (81 mg total) by mouth daily. 30 tablet 12  . Cyanocobalamin (VITAMIN B 12 PO) Take 500 mg by mouth 2 (two) times daily.    . dabigatran (PRADAXA) 150 MG  CAPS capsule TAKE ONE CAPSULE BY MOUTH EVERY 12 HOURS 60 capsule 6  . diltiazem (CARDIZEM) 30 MG tablet Take 1 tablet (30 mg total) by mouth 2 (two) times daily. 60 tablet 3  . glimepiride (AMARYL) 4 MG tablet Take 4 mg by mouth 2 (two) times daily.    Marland Kitchen. lisinopril (PRINIVIL,ZESTRIL) 20 MG tablet Take 20 mg by mouth daily.    . metFORMIN (GLUCOPHAGE-XR) 500 MG 24 hr tablet Take 1,000 mg by mouth 2 (two) times daily.      . pravastatin (PRAVACHOL) 80 MG tablet Take 80 mg by mouth daily.     No current facility-administered medications for this visit.    Allergies:   Review of patient's allergies indicates no known allergies.   Social History:  The patient  reports that he has never smoked. He does not have any smokeless tobacco history on file. He reports that he does not drink alcohol or use illicit drugs.   Family History:  The patient's  family history includes Other in an other family member; Other (age of onset: 8287) in his father.    ROS:  Please see the history of present illness.  Otherwise, review of systems is positive for hearing loss, exertional dyspnea, back pain, hip pain, dizziness, and gait instability.  All other systems are reviewed and negative.    PHYSICAL  EXAM: VS:  BP 122/86 mmHg  Pulse 97  Ht 6' (1.829 m)  Wt 209 lb 1.9 oz (94.856 kg)  BMI 28.36 kg/m2  SpO2 98% , BMI Body mass index is 28.36 kg/(m^2). GEN: Well nourished, well developed,  pleasant elderly male in no acute distress HEENT: normal Neck: no JVD, no masses, no carotid bruits Cardiac: Irregularly irregular with a grade 2/6 mid peaking systolic murmur and preserved A2 heard best at the right upper sternal border                  Respiratory:  clear to auscultation bilaterally, normal work of breathing GI: soft, nontender, nondistended, + BS MS: no deformity or atrophy Ext:1+ pretibial edema on the left, trace pretibial edema on the right  Skin: warm and dry, no rash Neuro:  Strength and sensation are  intact Psych: euthymic mood, full affect  EKG:  EKG is not ordered today.  Recent Labs: 02/12/2014: B Natriuretic Peptide 272.0* 02/13/2014: ALT 16 02/14/2014: BUN 24*; Creatinine 1.22; Hemoglobin 11.7*; Platelets 178; Potassium 4.6; Sodium 131*   Lipid Panel     Component Value Date/Time   CHOL * 06/16/2007 0605    248        ATP III CLASSIFICATION:  <200     mg/dL   Desirable  409-811  mg/dL   Borderline High  >=914    mg/dL   High   TRIG 782* 95/62/1308 0605   HDL 30* 06/16/2007 0605   CHOLHDL 8.3 06/16/2007 0605   VLDL 66* 06/16/2007 0605   LDLCALC * 06/16/2007 0605    152        Total Cholesterol/HDL:CHD Risk Coronary Heart Disease Risk Table                     Men   Women  1/2 Average Risk   3.4   3.3      Wt Readings from Last 3 Encounters:  04/01/14 209 lb 1.9 oz (94.856 kg)  02/15/14 212 lb 12.8 oz (96.525 kg)  02/19/13 220 lb 12.8 oz (100.154 kg)     Cardiac Studies Reviewed: 2D Echo 04/24/2012: Study Conclusions  - Left ventricle: The cavity size was normal. Wall thickness was increased in a pattern of mild LVH. Systolic function was normal. The estimated ejection fraction was in the range of 50% to 55%. Wall motion was normal; there were no regional wall motion abnormalities. - Aortic valve: There was very mild stenosis. - Mitral valve: Calcified annulus. Mild regurgitation. - Left atrium: The atrium was moderately to severely dilated. - Right atrium: The atrium was moderately to severely dilated. - Atrial septum: No defect or patent foramen ovale was identified. - Tricuspid valve: Moderate regurgitation. - Pulmonary arteries: PA peak pressure: 39mm Hg (S).  ASSESSMENT AND PLAN: 1.  Chronic atrial fibrillation: Continue anticoagulation with Pradaxa. No bleeding problems reported. Change diltiazem to long-acting pre and increase dose for better rate-control. Change to cardizem CD 120 mg daily.  2. Aortic stenosis, mild on echo 2014.  Repeat 2D Echo. Would grade at moderate by exam.   3. Exertional dyspnea: longstanding symptom. Suspect multifactorial. No clear cardiac etiology. Possible that AFib is contributing but he's had this for several years. Check 2D Echo.   4. HTN: well-controlled. Continue current Rx except for change in cardizem dosing.  5. Tremor/weakness: family would like him to have a second opinion regarding possibility of Parkinson's. Will refer to Dr Tat.  6. Carotid stenosis: 60-79% bilateral  ICA stenosis on last duplex 11/2013. Repeat one year.  Current medicines are reviewed with the patient today.  The patient does not have concerns regarding medicines.  The following changes have been made:  See above  Labs/ tests ordered today include:  No orders of the defined types were placed in this encounter.    Disposition:   FU in one year  Signed, [name], MD  04/01/2014 4:18 PM    [facility] [address] [place], [place], [place]  [address] Phone: [phone]; Fax: [phone]

## 2014-04-06 ENCOUNTER — Encounter: Payer: Self-pay | Admitting: Neurology

## 2014-04-08 ENCOUNTER — Ambulatory Visit (HOSPITAL_COMMUNITY): Payer: Medicare Other | Attending: Cardiovascular Disease | Admitting: Cardiology

## 2014-04-08 DIAGNOSIS — I10 Essential (primary) hypertension: Secondary | ICD-10-CM | POA: Diagnosis not present

## 2014-04-08 DIAGNOSIS — E785 Hyperlipidemia, unspecified: Secondary | ICD-10-CM | POA: Insufficient documentation

## 2014-04-08 DIAGNOSIS — E119 Type 2 diabetes mellitus without complications: Secondary | ICD-10-CM | POA: Insufficient documentation

## 2014-04-08 DIAGNOSIS — I350 Nonrheumatic aortic (valve) stenosis: Secondary | ICD-10-CM

## 2014-04-08 DIAGNOSIS — I4891 Unspecified atrial fibrillation: Secondary | ICD-10-CM

## 2014-04-08 DIAGNOSIS — R0600 Dyspnea, unspecified: Secondary | ICD-10-CM

## 2014-04-08 NOTE — Progress Notes (Signed)
Echo performed. 

## 2014-04-13 ENCOUNTER — Encounter: Payer: Self-pay | Admitting: Cardiovascular Disease

## 2014-04-13 NOTE — Telephone Encounter (Signed)
This encounter was created in error - please disregard.

## 2014-04-13 NOTE — Telephone Encounter (Signed)
New message      Want echo results------pls call back today

## 2014-04-20 ENCOUNTER — Other Ambulatory Visit: Payer: Self-pay | Admitting: Family Medicine

## 2014-04-20 DIAGNOSIS — M545 Low back pain: Secondary | ICD-10-CM

## 2014-05-01 ENCOUNTER — Ambulatory Visit
Admission: RE | Admit: 2014-05-01 | Discharge: 2014-05-01 | Disposition: A | Discharge status: Home / Self Care | Payer: Medicare Other | Source: Ambulatory Visit | Attending: Family Medicine | Admitting: Family Medicine

## 2014-05-01 DIAGNOSIS — M545 Low back pain: Secondary | ICD-10-CM

## 2014-05-01 MED ORDER — GADOBENATE DIMEGLUMINE 529 MG/ML IV SOLN
20.0000 mL | Freq: Once | INTRAVENOUS | Status: AC | PRN
  Administered 2014-05-01: 20 mL via INTRAVENOUS

## 2014-07-20 ENCOUNTER — Telehealth: Payer: Self-pay | Admitting: Cardiovascular Disease

## 2014-07-20 NOTE — Telephone Encounter (Signed)
New Prob   Pt c/o Shortness Of Breath: STAT if SOB developed within the last 24 hours or pt is noticeably SOB on the phone  1. Are you currently SOB (can you hear that pt is SOB on the phone)? Yes  2. How long have you been experiencing SOB? Last few days  3. Are you SOB when sitting or when up moving around? With exertion  4. Are you currently experiencing any other symptoms? Fatigue

## 2014-07-20 NOTE — Telephone Encounter (Signed)
Left message on machine for Frank HeckRandy to contact the office.

## 2014-07-22 NOTE — Telephone Encounter (Signed)
This pt is scheduled to see Dr Excell Seltzerooper on 07/23/14.

## 2014-07-23 ENCOUNTER — Ambulatory Visit (INDEPENDENT_AMBULATORY_CARE_PROVIDER_SITE_OTHER): Payer: Medicare Other | Admitting: Cardiovascular Disease

## 2014-07-23 ENCOUNTER — Ambulatory Visit
Admission: RE | Admit: 2014-07-23 | Discharge: 2014-07-23 | Disposition: A | Discharge status: Home / Self Care | Payer: Medicare Other | Source: Ambulatory Visit | Attending: Cardiovascular Disease | Admitting: Cardiovascular Disease

## 2014-07-23 ENCOUNTER — Encounter: Payer: Self-pay | Admitting: Cardiovascular Disease

## 2014-07-23 VITALS — BP 160/92 | HR 92 | Ht 72.0 in | Wt 208.1 lb

## 2014-07-23 DIAGNOSIS — R0600 Dyspnea, unspecified: Secondary | ICD-10-CM

## 2014-07-23 DIAGNOSIS — I350 Nonrheumatic aortic (valve) stenosis: Secondary | ICD-10-CM

## 2014-07-23 MED ORDER — DILTIAZEM HCL ER COATED BEADS 240 MG PO CP24: 240.0000 mg | ORAL_CAPSULE | Freq: Every day | ORAL | Status: DC

## 2014-07-23 NOTE — Progress Notes (Signed)
Cardiology Office Note   Date:  07/23/2014   ID:  GAMALIEL LUNDIE, DOB 05/08/1926, MRN [id number]  PCP:  Ailene Ravel, MD  Cardiologist:  [name], MD    No chief complaint on file.    History of Present Illness: Frank Marks is a 81 y.o. male who presents for follow-up of atrial fibrillation. The patient has a history of essential hypertension, chronic dyspnea, TIA, and carotid stenosis. He is here with his 2 sons today. The patient was treated for pneumonia in February this year. He has had problems with chronic dyspnea but has been much worse over the last 3-4 weeks. [place] the [place] and had a lot of problems with his breathing, even at rest. States he wasn't able to do anything at all. In addition, when he becomes short of breath, he then goes on to develop substernal chest discomfort he describes as a pressure. Symptoms resolve after he rests for a few minutes. He has not had exertional chest pain or pressure but admittedly he isn't able to do much physical activity at all. He has also developed leg swelling.    Past Medical History  Diagnosis Date  . Hypertension   . Hypercholesterolemia   . Stroke   . Osteoarthritis   . Diabetes mellitus     typeII  . Chronic back pain   . Hx of cardiovascular stress test     Lex MV 3/14:  Not gated, apical thinning, no ischemia.   Marland Kitchen Hx of echocardiogram     Echo 04/24/12: mild LVH, EF 50-55%, mild AS (mean 9), MAC, mild MR, mod-severe BAE, mod TR, PASP 39  . Atrial fibrillation     Past Surgical History  Procedure Laterality Date  . Laminectomy      decompressive  . Knee surgery      left    Current Outpatient Prescriptions  Medication Sig Dispense Refill  . aspirin EC 81 MG tablet Take 1 tablet (81 mg total) by mouth daily. 30 tablet 12  . BD PEN NEEDLE NANO U/F 32G X 4 MM MISC USE WITH INSULIN ADMINISTRATION DAILY  5  . Cyanocobalamin (VITAMIN B 12 PO) Take 500 mg by mouth 2 (two) times daily.      . dabigatran (PRADAXA) 150 MG CAPS capsule TAKE ONE CAPSULE BY MOUTH EVERY 12 HOURS 60 capsule 6  . diltiazem (CARDIZEM CD) 120 MG 24 hr capsule Take 1 capsule (120 mg total) by mouth daily. 90 capsule 3  . glimepiride (AMARYL) 4 MG tablet Take 4 mg by mouth 2 (two) times daily.    . insulin glargine (LANTUS) 100 UNIT/ML injection Inject 20 Units into the skin at bedtime.    Marland Kitchen lisinopril (PRINIVIL,ZESTRIL) 20 MG tablet Take 20 mg by mouth daily.    . metFORMIN (GLUCOPHAGE-XR) 500 MG 24 hr tablet Take 1,000 mg by mouth 2 (two) times daily.      . pravastatin (PRAVACHOL) 80 MG tablet Take 80 mg by mouth daily.    Gilman Schmidt TEST test strip daily.  4   No current facility-administered medications for this visit.    Allergies:   Review of patient's allergies indicates no known allergies.   Social History:  The patient  reports that he has never smoked. He has quit using smokeless tobacco. His smokeless tobacco use included Chew. He reports that he does not drink alcohol or use illicit drugs.   Family History:  The patient's  family history includes Alzheimer's  disease in his mother; Arthritis in his mother; Healthy in his mother; Other in an other family member; Stroke in his father.    ROS:  Please see the history of present illness.  Otherwise, review of systems is positive for chest pain, leg swelling, hearing loss, shortness of breath with activity, back pain, recurrent, wheezing, balance problems.  All other systems are reviewed and negative.    PHYSICAL EXAM: VS:  BP 160/92 mmHg  Pulse 92  Ht 6' (1.829 m)  Wt 208 lb 1.9 oz (94.403 kg)  BMI 28.22 kg/m2  SpO2 99% , BMI Body mass index is 28.22 kg/(m^2). GEN: Well nourished, well developed, in no acute distress HEENT: normal Neck: no JVD, no masses. bilateral carotid bruits Cardiac: RRR with grade 2/6 systolic ejection murmur at the RUSB           Respiratory:  clear to auscultation bilaterally, normal work of breathing GI: soft,  nontender, nondistended, + BS MS: no deformity or atrophy Ext: 2+ pretibial edema on the left and 1+ on the right, pedal pulses 2+= bilaterally Skin: warm and dry, no rash Neuro:  Strength and sensation are intact Psych: euthymic mood, full affect  EKG:  EKG is not ordered today.  Recent Labs: 02/12/2014: B Natriuretic Peptide 272.0* 02/13/2014: ALT 16 02/14/2014: BUN 24*; Creatinine, Ser 1.22; Hemoglobin 11.7*; Platelets 178; Potassium 4.6; Sodium 131*   Lipid Panel     Component Value Date/Time   CHOL * 06/16/2007 0605    248        ATP III CLASSIFICATION:  <200     mg/dL   Desirable  161-096  mg/dL   Borderline High  >=045    mg/dL   High   TRIG 409* 81/19/1478 0605   HDL 30* 06/16/2007 0605   CHOLHDL 8.3 06/16/2007 0605   VLDL 66* 06/16/2007 0605   LDLCALC * 06/16/2007 0605    152        Total Cholesterol/HDL:CHD Risk Coronary Heart Disease Risk Table                     Men   Women  1/2 Average Risk   3.4   3.3      Wt Readings from Last 3 Encounters:  07/23/14 208 lb 1.9 oz (94.403 kg)  05/01/14 210 lb (95.255 kg)  04/01/14 209 lb 1.9 oz (94.856 kg)     Cardiac Studies Reviewed: 2D Echo 04/08/2014 Study Conclusions  - Left ventricle: Wall thickness was increased in a pattern of moderate LVH. Systolic function was mildly reduced. The estimated ejection fraction was in the range of 45% to 50%. Wall motion was normal; there were no regional wall motion abnormalities. - Aortic valve: There was mild stenosis. - Mitral valve: Calcified annulus. Mildly thickened leaflets . There was mild regurgitation. - Left atrium: The atrium was mildly dilated. - Right atrium: The atrium was mildly dilated. - Pulmonary arteries: Systolic pressure was moderately increased. PA peak pressure: 50 mm Hg (S).  ASSESSMENT AND PLAN: 1.  Chronic atrial fibrillation: still with suboptimal rate control - recommended increase diltiazem to 240 mg daily. Continue anticoagulation  with pradaxa.  2. Aortic stenosis: mild by recent echo.  3. Progressive exertional dyspnea. Now with some signs of CHF on exam and lab findings with elevated BNP. Recommend cardiac cath to rule out obstructive CAD and evaluate hemodynamics in the setting of worsening symptoms and development of chest pressure. He will hold pradaxa 72 hours pre-cath.  I have reviewed the risks, indications, and alternatives to cardiac catheterization with the patient and his sons today. Risks include but are not limited to bleeding, infection, vascular injury, stroke, myocardial infection, arrhythmia, kidney injury, radiation-related injury in the case of prolonged fluoroscopy use, emergency cardiac surgery, and death. The patient understands the risks of serious complication is low (<1%).   4. Carotid stenosis: stable by most recent duplex with 60-79% bilateral ICA stenoses.   Current medicines are reviewed with the patient today.  The patient does not have concerns regarding medicines.  Labs/ tests ordered today include:  No orders of the defined types were placed in this encounter.    Disposition:   FU pending cath results  Signed, [name], MD  07/23/2014 3:18 PM    [facility] [address] [place], [place], [place]  [address] Phone: [phone]; Fax: [phone]

## 2014-07-23 NOTE — Patient Instructions (Signed)
Medication Instructions:  Your physician has recommended you make the following change in your medication:  1. INCREASE Diltiazem to 240mg  take one by mouth daily  Labwork: Your physician recommends that you have lab work today: BMP, CBC, PT/INR and BNP  Testing/Procedures: A chest x-ray takes a picture of the organs and structures inside the chest, including the heart, lungs, and blood vessels. This test can show several things, including, whether the heart is enlarges; whether fluid is building up in the lungs; and whether pacemaker / defibrillator leads are still in place.  Your physician has requested that you have a cardiac catheterization. Cardiac catheterization is used to diagnose and/or treat various heart conditions. Doctors may recommend this procedure for a number of different reasons. The most common reason is to evaluate chest pain. Chest pain can be a symptom of coronary artery disease (CAD), and cardiac catheterization can show whether plaque is narrowing or blocking your heart's arteries. This procedure is also used to evaluate the valves, as well as measure the blood flow and oxygen levels in different parts of your heart. For further information please visit [email or web address] Please follow instruction sheet, as given.  Follow-Up: We will arrange further follow-up after cardiac catheterization.   Any Other Special Instructions Will Be Listed Below (If Applicable).

## 2014-07-24 LAB — BASIC METABOLIC PANEL
BUN: 25 mg/dL — ABNORMAL HIGH (ref 6–23)
CALCIUM: 9.6 mg/dL (ref 8.4–10.5)
CO2: 24 mEq/L (ref 19–32)
Chloride: 102 mEq/L (ref 96–112)
Creatinine, Ser: 1.3 mg/dL (ref 0.40–1.50)
GFR: 55.69 mL/min — ABNORMAL LOW (ref >60.00)
Glucose, Bld: 217 mg/dL — ABNORMAL HIGH (ref 70–99)
Potassium: 4.7 mEq/L (ref 3.5–5.1)
Sodium: 136 mEq/L (ref 135–145)

## 2014-07-24 LAB — PROTIME-INR
INR: 1.2 ratio — ABNORMAL HIGH (ref 0.8–1.0)
Prothrombin Time: 13.2 s — ABNORMAL HIGH (ref 9.6–13.1)

## 2014-07-24 LAB — CBC
HEMATOCRIT: 40.2 % (ref 39.0–52.0)
Hemoglobin: 13.3 g/dL (ref 13.0–17.0)
MCHC: 33.1 g/dL (ref 30.0–36.0)
MCV: 85 fl (ref 78.0–100.0)
Platelets: 181 10*3/uL (ref 150.0–400.0)
RBC: 4.72 Mil/uL (ref 4.22–5.81)
RDW: 15.8 % — AB (ref 11.5–15.5)
WBC: 9.4 10*3/uL (ref 4.0–10.5)

## 2014-07-24 LAB — BRAIN NATRIURETIC PEPTIDE: Pro B Natriuretic peptide (BNP): 411 pg/mL — ABNORMAL HIGH (ref 0.0–100.0)

## 2014-07-29 ENCOUNTER — Encounter (HOSPITAL_COMMUNITY)
Admission: RE | Disposition: A | Discharge status: Home / Self Care | Payer: Medicare Other | Source: Ambulatory Visit | Attending: Cardiovascular Disease

## 2014-07-29 ENCOUNTER — Encounter (HOSPITAL_COMMUNITY): Payer: Self-pay | Admitting: Cardiovascular Disease

## 2014-07-29 ENCOUNTER — Observation Stay (HOSPITAL_COMMUNITY)
Admission: RE | Admit: 2014-07-29 | Discharge: 2014-07-30 | Disposition: A | Discharge status: Home / Self Care | Payer: Medicare Other | Source: Ambulatory Visit | Attending: Cardiovascular Disease | Admitting: Cardiovascular Disease

## 2014-07-29 DIAGNOSIS — I25110 Atherosclerotic heart disease of native coronary artery with unstable angina pectoris: Secondary | ICD-10-CM | POA: Diagnosis not present

## 2014-07-29 DIAGNOSIS — Z7901 Long term (current) use of anticoagulants: Secondary | ICD-10-CM

## 2014-07-29 DIAGNOSIS — I272 Other secondary pulmonary hypertension: Secondary | ICD-10-CM | POA: Diagnosis not present

## 2014-07-29 DIAGNOSIS — I350 Nonrheumatic aortic (valve) stenosis: Secondary | ICD-10-CM | POA: Diagnosis not present

## 2014-07-29 DIAGNOSIS — Z794 Long term (current) use of insulin: Secondary | ICD-10-CM | POA: Insufficient documentation

## 2014-07-29 DIAGNOSIS — I2582 Chronic total occlusion of coronary artery: Secondary | ICD-10-CM | POA: Insufficient documentation

## 2014-07-29 DIAGNOSIS — I6523 Occlusion and stenosis of bilateral carotid arteries: Secondary | ICD-10-CM | POA: Insufficient documentation

## 2014-07-29 DIAGNOSIS — E119 Type 2 diabetes mellitus without complications: Secondary | ICD-10-CM

## 2014-07-29 DIAGNOSIS — Z87891 Personal history of nicotine dependence: Secondary | ICD-10-CM | POA: Insufficient documentation

## 2014-07-29 DIAGNOSIS — I482 Chronic atrial fibrillation: Secondary | ICD-10-CM | POA: Insufficient documentation

## 2014-07-29 DIAGNOSIS — I2510 Atherosclerotic heart disease of native coronary artery without angina pectoris: Secondary | ICD-10-CM | POA: Diagnosis present

## 2014-07-29 DIAGNOSIS — I10 Essential (primary) hypertension: Secondary | ICD-10-CM | POA: Diagnosis present

## 2014-07-29 DIAGNOSIS — I5033 Acute on chronic diastolic (congestive) heart failure: Secondary | ICD-10-CM | POA: Diagnosis not present

## 2014-07-29 DIAGNOSIS — I6529 Occlusion and stenosis of unspecified carotid artery: Secondary | ICD-10-CM | POA: Diagnosis present

## 2014-07-29 DIAGNOSIS — J449 Chronic obstructive pulmonary disease, unspecified: Secondary | ICD-10-CM | POA: Diagnosis present

## 2014-07-29 DIAGNOSIS — I200 Unstable angina: Secondary | ICD-10-CM | POA: Diagnosis present

## 2014-07-29 DIAGNOSIS — E780 Pure hypercholesterolemia: Secondary | ICD-10-CM | POA: Diagnosis not present

## 2014-07-29 DIAGNOSIS — I4891 Unspecified atrial fibrillation: Secondary | ICD-10-CM | POA: Diagnosis present

## 2014-07-29 DIAGNOSIS — I639 Cerebral infarction, unspecified: Secondary | ICD-10-CM | POA: Diagnosis present

## 2014-07-29 DIAGNOSIS — G8929 Other chronic pain: Secondary | ICD-10-CM | POA: Insufficient documentation

## 2014-07-29 DIAGNOSIS — I4821 Permanent atrial fibrillation: Secondary | ICD-10-CM | POA: Diagnosis present

## 2014-07-29 DIAGNOSIS — Z8673 Personal history of transient ischemic attack (TIA), and cerebral infarction without residual deficits: Secondary | ICD-10-CM | POA: Insufficient documentation

## 2014-07-29 DIAGNOSIS — Z7982 Long term (current) use of aspirin: Secondary | ICD-10-CM | POA: Insufficient documentation

## 2014-07-29 HISTORY — DX: Pneumonia, unspecified organism: J18.9

## 2014-07-29 HISTORY — DX: Transient cerebral ischemic attack, unspecified: G45.9

## 2014-07-29 HISTORY — DX: Other chronic pain: G89.29

## 2014-07-29 HISTORY — DX: Occlusion and stenosis of unspecified carotid artery: I65.29

## 2014-07-29 HISTORY — DX: Chronic atrial fibrillation, unspecified: I48.20

## 2014-07-29 HISTORY — DX: Acute on chronic diastolic (congestive) heart failure: I50.33

## 2014-07-29 HISTORY — DX: Low back pain: M54.5

## 2014-07-29 HISTORY — DX: Type 2 diabetes mellitus without complications: E11.9

## 2014-07-29 HISTORY — PX: CARDIAC CATHETERIZATION: SHX172

## 2014-07-29 HISTORY — DX: Unspecified osteoarthritis, unspecified site: M19.90

## 2014-07-29 HISTORY — DX: Low back pain, unspecified: M54.50

## 2014-07-29 LAB — CBC
HCT: 35.5 % — ABNORMAL LOW (ref 39.0–52.0)
Hemoglobin: 11.9 g/dL — ABNORMAL LOW (ref 13.0–17.0)
MCH: 27.7 pg (ref 26.0–34.0)
MCHC: 33.5 g/dL (ref 30.0–36.0)
MCV: 82.6 fL (ref 78.0–100.0)
Platelets: 149 10*3/uL — ABNORMAL LOW (ref 150–400)
RBC: 4.3 MIL/uL (ref 4.22–5.81)
RDW: 14.6 % (ref 11.5–15.5)
WBC: 5.9 10*3/uL (ref 4.0–10.5)

## 2014-07-29 LAB — GLUCOSE, CAPILLARY
GLUCOSE-CAPILLARY: 146 mg/dL — AB (ref 65–99)
GLUCOSE-CAPILLARY: 102 mg/dL — AB (ref 65–99)
GLUCOSE-CAPILLARY: 275 mg/dL — AB (ref 65–99)

## 2014-07-29 LAB — TROPONIN I: Troponin I: <0.03 ng/mL (ref <0.031)

## 2014-07-29 LAB — CREATININE, SERUM
Creatinine, Ser: 1.16 mg/dL (ref 0.61–1.24)
GFR calc Af Amer: >60 mL/min (ref >60)
GFR, EST NON AFRICAN AMERICAN: 56 mL/min — AB (ref >60)

## 2014-07-29 SURGERY — LEFT HEART CATH AND CORONARY ANGIOGRAPHY
Anesthesia: LOCAL

## 2014-07-29 MED ORDER — SODIUM CHLORIDE 0.9 % IJ SOLN: 3.0000 mL | INTRAMUSCULAR | Status: DC | PRN

## 2014-07-29 MED ORDER — HEPARIN SODIUM (PORCINE) 1000 UNIT/ML IJ SOLN
INTRAMUSCULAR | Status: DC | PRN
  Administered 2014-07-29: 5000 [IU] via INTRAVENOUS

## 2014-07-29 MED ORDER — MIDAZOLAM HCL 2 MG/2ML IJ SOLN
INTRAMUSCULAR | Status: AC
  Filled 2014-07-29: qty 2

## 2014-07-29 MED ORDER — NITROGLYCERIN 0.4 MG SL SUBL
0.4000 mg | SUBLINGUAL_TABLET | SUBLINGUAL | Status: DC | PRN
  Administered 2014-07-29 (×4): 0.4 mg via SUBLINGUAL
  Filled 2014-07-29 (×3): qty 25

## 2014-07-29 MED ORDER — LIDOCAINE HCL (PF) 1 % IJ SOLN
INTRAMUSCULAR | Status: AC
  Filled 2014-07-29: qty 30

## 2014-07-29 MED ORDER — ONDANSETRON HCL 4 MG/2ML IJ SOLN: 4.0000 mg | Freq: Four times a day (QID) | INTRAMUSCULAR | Status: DC | PRN

## 2014-07-29 MED ORDER — ISOSORBIDE MONONITRATE 15 MG HALF TABLET
15.0000 mg | ORAL_TABLET | Freq: Every day | ORAL | Status: DC
  Administered 2014-07-29 – 2014-07-30 (×2): 15 mg via ORAL
  Filled 2014-07-29 (×2): qty 1

## 2014-07-29 MED ORDER — SODIUM CHLORIDE 0.9 % IV SOLN: 250.0000 mL | INTRAVENOUS | Status: DC | PRN

## 2014-07-29 MED ORDER — SODIUM CHLORIDE 0.9 % IV SOLN: INTRAVENOUS | Status: AC

## 2014-07-29 MED ORDER — SODIUM CHLORIDE 0.9 % IV SOLN
INTRAVENOUS | Status: DC
  Administered 2014-07-29: 12:00:00 via INTRAVENOUS

## 2014-07-29 MED ORDER — INSULIN GLARGINE 100 UNIT/ML ~~LOC~~ SOLN: 20.0000 [IU] | Freq: Every day | SUBCUTANEOUS | Status: DC

## 2014-07-29 MED ORDER — LISINOPRIL 20 MG PO TABS
20.0000 mg | ORAL_TABLET | Freq: Every day | ORAL | Status: DC
  Administered 2014-07-30: 20 mg via ORAL
  Filled 2014-07-29: qty 1

## 2014-07-29 MED ORDER — NITROGLYCERIN 0.4 MG SL SUBL
SUBLINGUAL_TABLET | SUBLINGUAL | Status: AC
  Administered 2014-07-29: 0.4 mg via SUBLINGUAL
  Filled 2014-07-29: qty 1

## 2014-07-29 MED ORDER — SODIUM CHLORIDE 0.9 % IJ SOLN
3.0000 mL | Freq: Two times a day (BID) | INTRAMUSCULAR | Status: DC
  Administered 2014-07-29 – 2014-07-30 (×2): 3 mL via INTRAVENOUS

## 2014-07-29 MED ORDER — ENSURE ENLIVE PO LIQD: 237.0000 mL | Freq: Two times a day (BID) | ORAL | Status: DC

## 2014-07-29 MED ORDER — LABETALOL HCL 5 MG/ML IV SOLN
INTRAVENOUS | Status: AC
  Filled 2014-07-29: qty 4

## 2014-07-29 MED ORDER — METOPROLOL TARTRATE 12.5 MG HALF TABLET
12.5000 mg | ORAL_TABLET | Freq: Two times a day (BID) | ORAL | Status: DC
  Administered 2014-07-29: 12.5 mg via ORAL
  Filled 2014-07-29 (×3): qty 1

## 2014-07-29 MED ORDER — ASPIRIN EC 81 MG PO TBEC
81.0000 mg | DELAYED_RELEASE_TABLET | Freq: Every day | ORAL | Status: DC
  Administered 2014-07-30: 81 mg via ORAL
  Filled 2014-07-29: qty 1

## 2014-07-29 MED ORDER — PRAVASTATIN SODIUM 80 MG PO TABS
80.0000 mg | ORAL_TABLET | Freq: Every day | ORAL | Status: DC
  Administered 2014-07-29 – 2014-07-30 (×2): 80 mg via ORAL
  Filled 2014-07-29 (×2): qty 1

## 2014-07-29 MED ORDER — FENTANYL CITRATE (PF) 100 MCG/2ML IJ SOLN
INTRAMUSCULAR | Status: DC | PRN
  Administered 2014-07-29: 25 ug via INTRAVENOUS

## 2014-07-29 MED ORDER — HEPARIN SODIUM (PORCINE) 1000 UNIT/ML IJ SOLN
INTRAMUSCULAR | Status: AC
  Filled 2014-07-29: qty 1

## 2014-07-29 MED ORDER — SODIUM CHLORIDE 0.9 % IJ SOLN: 3.0000 mL | Freq: Two times a day (BID) | INTRAMUSCULAR | Status: DC

## 2014-07-29 MED ORDER — DILTIAZEM HCL ER COATED BEADS 240 MG PO CP24
240.0000 mg | ORAL_CAPSULE | Freq: Every day | ORAL | Status: DC
  Administered 2014-07-30: 240 mg via ORAL
  Filled 2014-07-29: qty 1

## 2014-07-29 MED ORDER — SODIUM CHLORIDE 0.9 % IV SOLN
250.0000 mL | INTRAVENOUS | Status: DC | PRN
Start: 2014-07-29 — End: 2014-07-30

## 2014-07-29 MED ORDER — GLIMEPIRIDE 4 MG PO TABS
4.0000 mg | ORAL_TABLET | Freq: Two times a day (BID) | ORAL | Status: DC
  Administered 2014-07-30: 4 mg via ORAL
  Filled 2014-07-29 (×2): qty 1

## 2014-07-29 MED ORDER — ACETAMINOPHEN 325 MG PO TABS: 650.0000 mg | ORAL_TABLET | ORAL | Status: DC | PRN

## 2014-07-29 MED ORDER — IOHEXOL 350 MG/ML SOLN
INTRAVENOUS | Status: DC | PRN
  Administered 2014-07-29: 85 mL via INTRAVENOUS

## 2014-07-29 MED ORDER — VERAPAMIL HCL 2.5 MG/ML IV SOLN
INTRAVENOUS | Status: AC
  Filled 2014-07-29: qty 2

## 2014-07-29 MED ORDER — HEPARIN SODIUM (PORCINE) 5000 UNIT/ML IJ SOLN
5000.0000 [IU] | Freq: Three times a day (TID) | INTRAMUSCULAR | Status: DC
  Administered 2014-07-29 – 2014-07-30 (×2): 5000 [IU] via SUBCUTANEOUS
  Filled 2014-07-29 (×5): qty 1

## 2014-07-29 MED ORDER — FENTANYL CITRATE (PF) 100 MCG/2ML IJ SOLN
INTRAMUSCULAR | Status: AC
  Filled 2014-07-29: qty 2

## 2014-07-29 MED ORDER — VERAPAMIL HCL 2.5 MG/ML IV SOLN
INTRAVENOUS | Status: DC | PRN
  Administered 2014-07-29: 15:00:00 via INTRA_ARTERIAL

## 2014-07-29 MED ORDER — NITROGLYCERIN 0.4 MG SL SUBL
SUBLINGUAL_TABLET | SUBLINGUAL | Status: AC
  Filled 2014-07-29: qty 3

## 2014-07-29 MED ORDER — NITROGLYCERIN 1 MG/10 ML FOR IR/CATH LAB
INTRA_ARTERIAL | Status: AC
  Filled 2014-07-29: qty 10

## 2014-07-29 MED ORDER — ASPIRIN 81 MG PO CHEW: 81.0000 mg | CHEWABLE_TABLET | ORAL | Status: DC

## 2014-07-29 MED ORDER — HEPARIN (PORCINE) IN NACL 2-0.9 UNIT/ML-% IJ SOLN
INTRAMUSCULAR | Status: AC
  Filled 2014-07-29: qty 1000

## 2014-07-29 MED ORDER — MIDAZOLAM HCL 2 MG/2ML IJ SOLN
INTRAMUSCULAR | Status: DC | PRN
  Administered 2014-07-29: 1 mg via INTRAVENOUS

## 2014-07-29 MED ORDER — INSULIN ASPART 100 UNIT/ML ~~LOC~~ SOLN
0.0000 [IU] | Freq: Three times a day (TID) | SUBCUTANEOUS | Status: DC
  Administered 2014-07-30: 8 [IU] via SUBCUTANEOUS

## 2014-07-29 MED ORDER — LABETALOL HCL 5 MG/ML IV SOLN
10.0000 mg | Freq: Once | INTRAVENOUS | Status: AC
  Administered 2014-07-29: 10 mg via INTRAVENOUS

## 2014-07-29 SURGICAL SUPPLY — 15 items
CATH INFINITI 5 FR JL3.5 (CATHETERS) ×2 IMPLANT
CATH INFINITI 5FR ANG PIGTAIL (CATHETERS) ×2 IMPLANT
CATH INFINITI 5FR MULTPACK ANG (CATHETERS) IMPLANT
CATH INFINITI JR4 5F (CATHETERS) ×2 IMPLANT
DEVICE RAD COMP TR BAND LRG (VASCULAR PRODUCTS) ×2 IMPLANT
GLIDESHEATH SLEND SS 6F .021 (SHEATH) ×2 IMPLANT
KIT HEART LEFT (KITS) ×2 IMPLANT
PACK CARDIAC CATHETERIZATION (CUSTOM PROCEDURE TRAY) ×2 IMPLANT
SHEATH PINNACLE 5F 10CM (SHEATH) IMPLANT
SYR MEDRAD MARK V 150ML (SYRINGE) ×2 IMPLANT
TRANSDUCER W/STOPCOCK (MISCELLANEOUS) ×2 IMPLANT
TUBING CIL FLEX 10 FLL-RA (TUBING) ×2 IMPLANT
WIRE EMERALD 3MM-J .035X150CM (WIRE) IMPLANT
WIRE HI TORQ VERSACORE-J 145CM (WIRE) ×2 IMPLANT
WIRE SAFE-T 1.5MM-J .035X260CM (WIRE) ×2 IMPLANT

## 2014-07-29 NOTE — Progress Notes (Signed)
Reassessed pt he stated that his pain is now a zero.  He is resting comfortably.  Will Continue to monitor closely

## 2014-07-29 NOTE — Progress Notes (Signed)
Pt reports being pain free after one NTG SL tablet. VSS. Will monitor.

## 2014-07-30 ENCOUNTER — Encounter (HOSPITAL_COMMUNITY): Payer: Self-pay | Admitting: Cardiovascular Disease

## 2014-07-30 ENCOUNTER — Other Ambulatory Visit: Payer: Self-pay | Admitting: Cardiology

## 2014-07-30 DIAGNOSIS — I5033 Acute on chronic diastolic (congestive) heart failure: Secondary | ICD-10-CM

## 2014-07-30 DIAGNOSIS — I4891 Unspecified atrial fibrillation: Secondary | ICD-10-CM | POA: Diagnosis present

## 2014-07-30 DIAGNOSIS — R0600 Dyspnea, unspecified: Secondary | ICD-10-CM

## 2014-07-30 DIAGNOSIS — I2510 Atherosclerotic heart disease of native coronary artery without angina pectoris: Secondary | ICD-10-CM | POA: Diagnosis present

## 2014-07-30 DIAGNOSIS — J449 Chronic obstructive pulmonary disease, unspecified: Secondary | ICD-10-CM | POA: Diagnosis present

## 2014-07-30 DIAGNOSIS — I272 Pulmonary hypertension, unspecified: Secondary | ICD-10-CM | POA: Diagnosis present

## 2014-07-30 DIAGNOSIS — I350 Nonrheumatic aortic (valve) stenosis: Secondary | ICD-10-CM

## 2014-07-30 DIAGNOSIS — Z7901 Long term (current) use of anticoagulants: Secondary | ICD-10-CM

## 2014-07-30 DIAGNOSIS — I6529 Occlusion and stenosis of unspecified carotid artery: Secondary | ICD-10-CM | POA: Diagnosis present

## 2014-07-30 DIAGNOSIS — I25110 Atherosclerotic heart disease of native coronary artery with unstable angina pectoris: Secondary | ICD-10-CM | POA: Diagnosis not present

## 2014-07-30 DIAGNOSIS — I639 Cerebral infarction, unspecified: Secondary | ICD-10-CM | POA: Diagnosis present

## 2014-07-30 LAB — BASIC METABOLIC PANEL
ANION GAP: 10 (ref 5–15)
BUN: 20 mg/dL (ref 6–20)
CALCIUM: 8.9 mg/dL (ref 8.9–10.3)
CO2: 20 mmol/L — ABNORMAL LOW (ref 22–32)
CREATININE: 1.06 mg/dL (ref 0.61–1.24)
Chloride: 104 mmol/L (ref 101–111)
Glucose, Bld: 284 mg/dL — ABNORMAL HIGH (ref 65–99)
Potassium: 4.2 mmol/L (ref 3.5–5.1)
SODIUM: 134 mmol/L — AB (ref 135–145)

## 2014-07-30 LAB — GLUCOSE, CAPILLARY: Glucose-Capillary: 268 mg/dL — ABNORMAL HIGH (ref 65–99)

## 2014-07-30 MED ORDER — ENSURE ENLIVE PO LIQD: 237.0000 mL | Freq: Two times a day (BID) | ORAL | Status: DC

## 2014-07-30 MED ORDER — METOPROLOL TARTRATE 25 MG PO TABS: 25.0000 mg | ORAL_TABLET | Freq: Two times a day (BID) | ORAL | Status: DC

## 2014-07-30 MED ORDER — METFORMIN HCL ER 500 MG PO TB24: 1000.0000 mg | ORAL_TABLET | Freq: Two times a day (BID) | ORAL | Status: DC

## 2014-07-30 MED ORDER — ACETAMINOPHEN 325 MG PO TABS: 650.0000 mg | ORAL_TABLET | ORAL | Status: DC | PRN

## 2014-07-30 MED ORDER — NITROGLYCERIN 0.4 MG SL SUBL: 0.4000 mg | SUBLINGUAL_TABLET | SUBLINGUAL | Status: DC | PRN

## 2014-07-30 MED ORDER — ISOSORBIDE MONONITRATE ER 30 MG PO TB24: 15.0000 mg | ORAL_TABLET | Freq: Every day | ORAL | Status: DC

## 2014-07-30 MED ORDER — METOPROLOL TARTRATE 25 MG PO TABS
25.0000 mg | ORAL_TABLET | Freq: Two times a day (BID) | ORAL | Status: DC
  Administered 2014-07-30: 25 mg via ORAL
  Filled 2014-07-30 (×2): qty 1

## 2014-07-30 MED ORDER — DABIGATRAN ETEXILATE MESYLATE 150 MG PO CAPS: ORAL_CAPSULE | ORAL | Status: DC

## 2014-07-30 MED FILL — Heparin Sodium (Porcine) 2 Unit/ML in Sodium Chloride 0.9%: INTRAMUSCULAR | Qty: 1000 | Status: AC

## 2014-07-30 MED FILL — Nitroglycerin IV Soln 100 MCG/ML in D5W: INTRA_ARTERIAL | Qty: 10 | Status: AC

## 2014-07-30 MED FILL — Lidocaine HCl Local Preservative Free (PF) Inj 1%: INTRAMUSCULAR | Qty: 30 | Status: AC

## 2014-07-30 NOTE — H&P (Signed)
Sherren Mocha, MD at 07/23/2014 3:18 PM     Status: Signed       Expand All Collapse All      Cardiology Office Note   Date: 07/23/2014   ID: CAMP GOPAL, DOB 1926/09/16, MRN [id number]  PCP: Leonides Sake, MD Cardiologist: Sherren Mocha, MD   No chief complaint on file.   History of Present Illness: Frank Marks is a 81 y.o. male who presents for follow-up of atrial fibrillation. The patient has a history of essential hypertension, chronic dyspnea, TIA, and carotid stenosis. He is here with his 2 sons today. The patient was treated for pneumonia in February this year. He has had problems with chronic dyspnea but has been much worse over the last 3-4 weeks. He recently went to the mountains and had a lot of problems with his breathing, even at rest. States he wasn't able to do anything at all. In addition, when he becomes short of breath, he then goes on to develop substernal chest discomfort he describes as a pressure. Symptoms resolve after he rests for a few minutes. He has not had exertional chest pain or pressure but admittedly he isn't able to do much physical activity at all. He has also developed leg swelling.    Past Medical History  Diagnosis Date  . Hypertension   . Hypercholesterolemia   . Stroke   . Osteoarthritis   . Diabetes mellitus     typeII  . Chronic back pain   . Hx of cardiovascular stress test     Lex MV 3/14: Not gated, apical thinning, no ischemia.   Marland Kitchen Hx of echocardiogram     Echo 04/24/12: mild LVH, EF 50-55%, mild AS (mean 9), MAC, mild MR, mod-severe BAE, mod TR, PASP 39  . Atrial fibrillation     Past Surgical History  Procedure Laterality Date  . Laminectomy      decompressive  . Knee surgery      left    Current Outpatient Prescriptions  Medication Sig Dispense Refill  . aspirin EC 81 MG tablet Take 1 tablet (81 mg total) by mouth daily. 30  tablet 12  . BD PEN NEEDLE NANO U/F 32G X 4 MM MISC USE WITH INSULIN ADMINISTRATION DAILY  5  . Cyanocobalamin (VITAMIN B 12 PO) Take 500 mg by mouth 2 (two) times daily.    . dabigatran (PRADAXA) 150 MG CAPS capsule TAKE ONE CAPSULE BY MOUTH EVERY 12 HOURS 60 capsule 6  . diltiazem (CARDIZEM CD) 120 MG 24 hr capsule Take 1 capsule (120 mg total) by mouth daily. 90 capsule 3  . glimepiride (AMARYL) 4 MG tablet Take 4 mg by mouth 2 (two) times daily.    . insulin glargine (LANTUS) 100 UNIT/ML injection Inject 20 Units into the skin at bedtime.    Marland Kitchen lisinopril (PRINIVIL,ZESTRIL) 20 MG tablet Take 20 mg by mouth daily.    . metFORMIN (GLUCOPHAGE-XR) 500 MG 24 hr tablet Take 1,000 mg by mouth 2 (two) times daily.     . pravastatin (PRAVACHOL) 80 MG tablet Take 80 mg by mouth daily.    Angelia Mould TEST test strip daily.  4   No current facility-administered medications for this visit.    Allergies: Review of patient's allergies indicates no known allergies.   Social History: The patient  reports that he has never smoked. He has quit using smokeless tobacco. His smokeless tobacco use included Chew. He reports that he does not drink alcohol or use  illicit drugs.   Family History: The patient's family history includes Alzheimer's disease in his mother; Arthritis in his mother; Healthy in his mother; Other in an other family member; Stroke in his father.    ROS: Please see the history of present illness. Otherwise, review of systems is positive for chest pain, leg swelling, hearing loss, shortness of breath with activity, back pain, recurrent, wheezing, balance problems. All other systems are reviewed and negative.    PHYSICAL EXAM: VS: BP 160/92 mmHg  Pulse 92  Ht 6' (1.829 m)  Wt 208 lb 1.9 oz (94.403 kg)  BMI 28.22 kg/m2  SpO2 99% , BMI Body mass index is 28.22 kg/(m^2). GEN: Well nourished, well developed, in no acute  distress  HEENT: normal  Neck: no JVD, no masses. bilateral carotid bruits Cardiac: RRR with grade 2/6 systolic ejection murmur at the RUSB  Respiratory: clear to auscultation bilaterally, normal work of breathing GI: soft, nontender, nondistended, + BS MS: no deformity or atrophy  Ext: 2+ pretibial edema on the left and 1+ on the right, pedal pulses 2+= bilaterally Skin: warm and dry, no rash Neuro: Strength and sensation are intact Psych: euthymic mood, full affect  EKG: EKG is not ordered today.  Recent Labs: 02/12/2014: B Natriuretic Peptide 272.0* 02/13/2014: ALT 16 02/14/2014: BUN 24*; Creatinine, Ser 1.22; Hemoglobin 11.7*; Platelets 178; Potassium 4.6; Sodium 131*   Lipid Panel   Labs (Brief)       Component Value Date/Time   CHOL * 06/16/2007 0605    248  ATP III CLASSIFICATION: <200 mg/dL Desirable 200-239 mg/dL Borderline High >=240 mg/dL High   TRIG 329* 06/16/2007 0605   HDL 30* 06/16/2007 0605   CHOLHDL 8.3 06/16/2007 0605   VLDL 66* 06/16/2007 0605   LDLCALC * 06/16/2007 0605    152  Total Cholesterol/HDL:CHD Risk Coronary Heart Disease Risk Table  Men Women 1/2 Average Risk 3.4 3.3       Wt Readings from Last 3 Encounters:  07/23/14 208 lb 1.9 oz (94.403 kg)  05/01/14 210 lb (95.255 kg)  04/01/14 209 lb 1.9 oz (94.856 kg)     Cardiac Studies Reviewed: 2D Echo 04/08/2014 Study Conclusions  - Left ventricle: Wall thickness was increased in a pattern of moderate LVH. Systolic function was mildly reduced. The estimated ejection fraction was in the range of 45% to 50%. Wall motion was normal; there were no regional wall motion abnormalities. - Aortic valve: There was mild stenosis. - Mitral valve: Calcified annulus. Mildly thickened leaflets . There was mild regurgitation. - Left atrium: The atrium was mildly dilated. - Right  atrium: The atrium was mildly dilated. - Pulmonary arteries: Systolic pressure was moderately increased. PA peak pressure: 50 mm Hg (S).  ASSESSMENT AND PLAN: 1. Chronic atrial fibrillation: still with suboptimal rate control - recommended increase diltiazem to 240 mg daily. Continue anticoagulation with pradaxa.  2. Aortic stenosis: mild by recent echo.  3. Progressive exertional dyspnea. Now with some signs of CHF on exam and lab findings with elevated BNP. Recommend cardiac cath to rule out obstructive CAD and evaluate hemodynamics in the setting of worsening symptoms and development of chest pressure. He will hold pradaxa 72 hours pre-cath.   I have reviewed the risks, indications, and alternatives to cardiac catheterization with the patient and his sons today. Risks include but are not limited to bleeding, infection, vascular injury, stroke, myocardial infection, arrhythmia, kidney injury, radiation-related injury in the case of prolonged fluoroscopy use, emergency cardiac surgery, and death.  The patient understands the risks of serious complication is low (<1%).   4. Carotid stenosis: stable by most recent duplex with 60-79% bilateral ICA stenoses.   Current medicines are reviewed with the patient today. The patient does not have concerns regarding medicines.  Labs/ tests ordered today include:  No orders of the defined types were placed in this encounter.   Disposition: FU pending cath results  Signed, Sherren Mocha, MD  07/23/2014 3:18 PM  [facility] [address], [place], [place] [address] Phone: [phone]; Fax: [phone]        ADDENDUM: Pt admitted post-cath with concerns of unstable angina. Will review treatment options with colleagues and initiate medical therapy with a long-acting nitrate and beta-blocker. He has had resting chest pain today and need to be sure his angina can be controlled with excalation of medical therapy.  See cardiac cath report for details. Have met with patient and family at length and discussed pros and cons of medical therapy, multivessel PCI, and CABG. Initially will treat with medical therapy.  Sherren Mocha 07/30/2014 5:25 AM

## 2014-07-30 NOTE — Discharge Summary (Signed)
Patient ID: Frank Marks,  MRN: [id number], DOB/AGE: 1926/07/29 81 y.o.  Admit date: 07/29/2014 Discharge date: 07/30/2014  Primary Care Provider: Ailene Ravel, MD Primary Cardiologist: Dr Excell Seltzer  Discharge Diagnoses Active Problems:   Acute on chronic diastolic CHF (congestive heart failure), NYHA class 3   Unstable angina pectoris   CAD in native artery- severe- medical Rx   Dyspnea   Atrial fibrillation with RVR   Permanent atrial fibrillation   Essential hypertension   Diabetes mellitus type 2 in nonobese   Pulmonary hypertension-PA 50 mmHg   Chronic anticoagulation-Pradaxa   Mild aortic stenosis   Stroke   Carotid artery stenosis    Procedures: Coronary angiogram 07/29/14   Hospital Course:  81 y.o. male who presents for follow-up of atrial fibrillation. The patient has a history of essential hypertension, chronic dyspnea, TIA, and carotid stenosis. He is here with his 2 sons today. The patient was treated for pneumonia in February this year. He has had problems with chronic dyspnea but has been much worse over the last 3-4 weeks. He recently went to the mountains and had a lot of problems with his breathing, even at rest. States he wasn't able to do anything at all. In addition, when he becomes short of breath, he then goes on to develop substernal chest discomfort he describes as a pressure. Symptoms resolve after he rests for a few minutes. The pt was admitted 07/23/14. It was noted that his AF was elevated and beta blocker was added. Dr Excell Seltzer decided to proceed with diagnostic cath which was done 07/29/14. This revealed severe 2V CAD. His EF by echo was 45-50%. The plan is for medical Rx. Dr Excell Seltzer would also like the pt evaluated by pulmonary as an OP. PFTs and a f/u have been arranged.    Discharge Vitals:  Blood pressure 158/89, pulse 73, temperature 97.7 F (36.5 C), temperature source Oral, resp. rate 19, height  (1.854 m), weight 208 lb (94.348 kg), SpO2  98 %.    Labs: Results for orders placed or performed during the hospital encounter of 07/29/14 (from the past 24 hour(s))  Glucose, capillary     Status: Abnormal   Collection Time: 07/29/14 11:16 AM  Result Value Ref Range   Glucose-Capillary 146 (H) 65 - 99 mg/dL  Glucose, capillary     Status: Abnormal   Collection Time: 07/29/14  4:08 PM  Result Value Ref Range   Glucose-Capillary 102 (H) 65 - 99 mg/dL  CBC     Status: Abnormal   Collection Time: 07/29/14  6:05 PM  Result Value Ref Range   WBC 5.9 4.0 - 10.5 K/uL   RBC 4.30 4.22 - 5.81 MIL/uL   Hemoglobin 11.9 (L) 13.0 - 17.0 g/dL   HCT 40.9 (L) 81.1 - 91.4 %   MCV 82.6 78.0 - 100.0 fL   MCH 27.7 26.0 - 34.0 pg   MCHC 33.5 30.0 - 36.0 g/dL   RDW 78.2 95.6 - 21.3 %   Platelets 149 (L) 150 - 400 K/uL  Creatinine, serum     Status: Abnormal   Collection Time: 07/29/14  6:05 PM  Result Value Ref Range   Creatinine, Ser 1.16 0.61 - 1.24 mg/dL   GFR calc non Af Amer 56 (L) >60 mL/min   GFR calc Af Amer >60 >60 mL/min  Troponin I     Status: None   Collection Time: 07/29/14  6:05 PM  Result Value Ref Range   Troponin  I <0.03 <0.031 ng/mL  Glucose, capillary     Status: Abnormal   Collection Time: 07/29/14  9:05 PM  Result Value Ref Range   Glucose-Capillary 275 (H) 65 - 99 mg/dL   Comment 1 Notify RN    Comment 2 Document in Chart   Troponin I     Status: None   Collection Time: 07/29/14  9:39 PM  Result Value Ref Range   Troponin I <0.03 <0.031 ng/mL  Basic metabolic panel     Status: Abnormal   Collection Time: 07/30/14  4:44 AM  Result Value Ref Range   Sodium 134 (L) 135 - 145 mmol/L   Potassium 4.2 3.5 - 5.1 mmol/L   Chloride 104 101 - 111 mmol/L   CO2 20 (L) 22 - 32 mmol/L   Glucose, Bld 284 (H) 65 - 99 mg/dL   BUN 20 6 - 20 mg/dL   Creatinine, Ser 1.61 0.61 - 1.24 mg/dL   Calcium 8.9 8.9 - 09.6 mg/dL   GFR calc non Af Amer >60 >60 mL/min   GFR calc Af Amer >60 >60 mL/min   Anion gap 10 5 - 15  Glucose,  capillary     Status: Abnormal   Collection Time: 07/30/14  5:49 AM  Result Value Ref Range   Glucose-Capillary 268 (H) 65 - 99 mg/dL    Disposition:      Follow-up Information    Follow up with [name], MD.   Specialty:  Cardiology   Why:  office will call you   Contact information:   [address] [place] [address] [phone]       Follow up with Max Fickle, MD On 08/21/2014.   Specialty:  Pulmonary Disease   Why:  3:30 pm   Contact information:   [address] [place] [address] [phone]       Discharge Medications:    Medication List    STOP taking these medications        diltiazem 30 MG tablet  Commonly known as:  CARDIZEM      TAKE these medications        acetaminophen 325 MG tablet  Commonly known as:  TYLENOL  Take 2 tablets (650 mg total) by mouth every 4 (four) hours as needed for headache or mild pain.     aspirin EC 81 MG tablet  Take 1 tablet (81 mg total) by mouth daily.     BD PEN NEEDLE NANO U/F 32G X 4 MM Misc  Generic drug:  Insulin Pen Needle  USE WITH INSULIN ADMINISTRATION DAILY     dabigatran 150 MG Caps capsule  Commonly known as:  PRADAXA  TAKE ONE CAPSULE BY MOUTH EVERY 12 HOURS     diltiazem 240 MG 24 hr capsule  Commonly known as:  CARDIZEM CD  Take 1 capsule (240 mg total) by mouth daily.     feeding supplement (ENSURE ENLIVE) Liqd  Take 237 mLs by mouth 2 (two) times daily between meals.     glimepiride 4 MG tablet  Commonly known as:  AMARYL  Take 4 mg by mouth 2 (two) times daily.     isosorbide mononitrate 30 MG 24 hr tablet  Commonly known as:  IMDUR  Take 0.5 tablets (15 mg total) by mouth daily.     LANTUS SOLOSTAR 100 UNIT/ML Solostar Pen  Generic drug:  Insulin Glargine  USE 20 UNITS AT BEDTIME     lisinopril 20 MG tablet  Commonly known as:  PRINIVIL,ZESTRIL  Take 20 mg by mouth daily.     metFORMIN 500 MG 24 hr tablet  Commonly known as:  GLUCOPHAGE-XR  Take  2 tablets (1,000 mg total) by mouth 2 (two) times daily.  Start taking on:  08/01/2014     metoprolol tartrate 25 MG tablet  Commonly known as:  LOPRESSOR  Take 1 tablet (25 mg total) by mouth 2 (two) times daily.     nitroGLYCERIN 0.4 MG SL tablet  Commonly known as:  NITROSTAT  Place 1 tablet (0.4 mg total) under the tongue every 5 (five) minutes as needed for chest pain.     pravastatin 80 MG tablet  Commonly known as:  PRAVACHOL  Take 80 mg by mouth daily.     TRUETRACK TEST test strip  Generic drug:  glucose blood  1 each by Other route 2 (two) times daily.     VITAMIN B 12 PO  Take 2,500 mg by mouth daily.         Duration of Discharge Encounter: Greater than 30 minutes including physician time.  Jolene Provost PA-C 07/30/2014 9:54 AM

## 2014-07-30 NOTE — Progress Notes (Signed)
Nutrition Brief Note  Patient identified on the Malnutrition Screening Tool (MST) Report.    Wt Readings from Last 15 Encounters:  07/29/14 208 lb (94.348 kg)  07/23/14 208 lb 1.9 oz (94.403 kg)  05/01/14 210 lb (95.255 kg)  04/01/14 209 lb 1.9 oz (94.856 kg)  02/15/14 212 lb 12.8 oz (96.525 kg)  02/19/13 220 lb 12.8 oz (100.154 kg)  05/07/12 227 lb 6.4 oz (103.148 kg)  04/30/12 218 lb (98.884 kg)  04/24/12 222 lb (100.699 kg)  12/06/11 226 lb (102.513 kg)  11/30/10 229 lb (103.874 kg)  06/07/10 232 lb 1.9 oz (105.289 kg)  04/27/10 228 lb (103.42 kg)  04/13/10 231 lb (104.781 kg)    Body mass index is 27.45 kg/(m^2). Patient meets criteria for Overweight based on current BMI.   Current diet order is Heart Healthy/Carbohydrate Modified, patient is consuming approximately 100% of meals at this time. Labs and medications reviewed.   No nutrition interventions warranted at this time. If nutrition issues arise, please consult RD.   Maureen Chatters, RD, LDN Pager #: [phone] After-Hours Pager #: [phone]

## 2014-07-30 NOTE — Progress Notes (Signed)
    Subjective:  Feels well this am. Has been up without dizziness or weakness. No chest pain.  Objective:  Vital Signs in the last 24 hours: Temp:  [97.6 F (36.4 C)-98.5 F (36.9 C)] 97.7 F (36.5 C) (06/16 0543) Pulse Rate:  [40-85] 73 (06/16 0543) Resp:  [16-19] 19 (06/16 0543) BP: (110-191)/(58-100) 158/89 mmHg (06/16 0543) SpO2:  [95 %-100 %] 98 % (06/16 0543) Weight:  [208 lb (94.348 kg)] 208 lb (94.348 kg) (06/15 1058)  Intake/Output from previous day: 06/15 0701 - 06/16 0700 In: 3 [I.V.:3] Out: 1200 [Urine:1200]  Physical Exam: Pt is alert and oriented, pleasant elderly man in NAD HEENT: normal Neck: JVP - normal Lungs: CTA bilaterally CV: irregular with grade 2/6 SEM at the RUSB Abd: soft, NT, Positive BS, no hepatomegaly Ext: no C/C/E, distal pulses intact and equal, right radial site clear Skin: warm/dry no rash  Lab Results:  Recent Labs  07/29/14 1805  WBC 5.9  HGB 11.9*  PLT 149*    Recent Labs  07/29/14 1805 07/30/14 0444  NA  --  134*  K  --  4.2  CL  --  104  CO2  --  20*  GLUCOSE  --  284*  BUN  --  20  CREATININE 1.16 1.06    Recent Labs  07/29/14 1805 07/29/14 2139  TROPONINI <0.03 <0.03   Tele: Atrial fibrillation, HR 65-90 bpm  Assessment/Plan:  1. Unstable angina: noted to have multi-vessel CAD on cath. I've reviewed films with colleagues. Because of diffuse nature of CAD, favor medical therapy rather than PCI. I do not think he would do well with CABG at age 81 with progressive dyspnea, weakness, worsening functional status which is multifactorial. Discussed at length with patient and family who agree with plan. Metoprolol and isosorbide added to his medical program.  2. Atrial fibrillation, chronic: hx stroke. Requires long-term anticoagulation. Resume Pradaxa today - start back with this evening's dose. Rate-control with diltiazem (recent dose increase) and metoprolol (new this admission).  3. Chronic diastolic and  systolic CHF: mild LV dysfunction by cath and echo. LVEDP normal at cath. Medical therapy as above.   4. Dyspnea: out of proportion to cardiac disease, and longstanding complaint. Has remote smoking history. Recent CXR was clear. Possible component of COPD. I recommended pulmonary evaluation last year but he declined. Will now make referral to pulmonary - please order PFT's prior to pulmonary visit. thx  5. HTN with suboptimal control. Med additions as above.  Dispo: home this am.  [name], M.D. 07/30/2014, 8:09 AM

## 2014-08-21 ENCOUNTER — Encounter: Payer: Self-pay | Admitting: Pulmonary Disease

## 2014-08-21 ENCOUNTER — Ambulatory Visit (INDEPENDENT_AMBULATORY_CARE_PROVIDER_SITE_OTHER): Payer: Medicare Other | Admitting: Pulmonary Disease

## 2014-08-21 VITALS — BP 146/68 | HR 52 | Ht 70.5 in | Wt 209.0 lb

## 2014-08-21 DIAGNOSIS — I2510 Atherosclerotic heart disease of native coronary artery without angina pectoris: Secondary | ICD-10-CM

## 2014-08-21 DIAGNOSIS — R0600 Dyspnea, unspecified: Secondary | ICD-10-CM | POA: Diagnosis not present

## 2014-08-21 MED ORDER — ALBUTEROL SULFATE 108 (90 BASE) MCG/ACT IN AEPB: 1.0000 | INHALATION_SPRAY | Freq: Four times a day (QID) | RESPIRATORY_TRACT | Status: DC | PRN

## 2014-08-21 NOTE — Assessment & Plan Note (Signed)
I have reviewed the hospitalization records from last month where he was found to have two-vessel coronary artery disease with mild cardiomyopathy. As noted above, I think this is the primary cause for his dyspnea. We will evaluate further for lung disease with a pulmonary function test.  He has some mild cough and is on an ACE inhibitor, but considering the severity of his heart disease think it is reasonable to continue the ACE inhibitor for now.  Plan: Continue current medical regimen

## 2014-08-21 NOTE — Progress Notes (Signed)
Subjective:    Patient ID: Frank Marks, male    DOB: December 04, 1926, 81 y.o.   MRN: [id number]  HPI Chief Complaint  Patient presents with  . Advice Only    Referred by Dr. Diona Fanti for dyspnea.  Pt states his sob is worse when walking X1 year, [place].     This is a very pleasant 81 year old male with a past medical history significant for ischemic cardiomyopathy and coronary artery disease who comes to my clinic today for evaluation of progressive shortness of breath. He says that the shortness of breath has been progressing over the course of one year. He had no problems with a respiratory illness as a child and did not experience any significant respiratory issues until he was in the [employer] when he had pneumonia. He says that he was hospitalized in [place] for 3 weeks. After that he had no problems with shortness of breath and remained quite active and long for some and and frequently ran long distances. He has remained active with yard work, work on his farm, and other activities but has noticed progressive shortness of breath over the last 12 months. This has been associated with a perceived worsening of arm and leg strength. His family says that he can only walk about 10-15 steps without starting to feel short of breath. He has a dry cough on a daily basis which has been present for the last several months. He was hospitalized for pneumonia in December 2015. He has also been found to have coronary artery disease and was hospitalized one month ago for left heart catheterization which showed severe obstructive coronary artery disease in addition to a mild ischemic cardiomyopathy. It was felt that medical therapy was the best approach. He was discharged and it was recommended that he come to see me in clinic.   Past Medical History  Diagnosis Date  . Hypertension   . Hypercholesterolemia   . Stroke   . Chronic back pain   . Hx of cardiovascular stress test     Lex MV  3/14:  Not gated, apical thinning, no ischemia.   Marland Kitchen Hx of echocardiogram     Echo 04/24/12: mild LVH, EF 50-55%, mild AS (mean 9), MAC, mild MR, mod-severe BAE, mod TR, PASP 39  . Acute on chronic diastolic CHF (congestive heart failure), NYHA class 3 07/29/2014  . TIA (transient ischemic attack) 06/2007    "had 2-3 little ones around that time"  . Carotid artery stenosis   . Chronic atrial fibrillation   . Type II diabetes mellitus   . Osteoarthritis   . Arthritis     "back" (07/29/2014)  . Chronic lower back pain   . Exertional shortness of breath     chronic/notes 07/29/2014  . Pneumonia ~ 1951; 02/2014     Family History  Problem Relation Age of Onset  . Other      no family hx of coronary artery disease  . Stroke Father   . Healthy Mother   . Arthritis Mother   . Alzheimer's disease Mother      History   Social History  . Marital Status: Divorced    Spouse Name: N/A  . Number of Children: N/A  . Years of Education: N/A   Occupational History  . 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 Smoking status: Former Smoker -- 1.00 packs/day for 5 years  Types: Cigarettes    Quit date: 08/20/1984  . Smokeless tobacco: Former Neurosurgeon    Types: Chew     Comment: "stopped smoking cigarettes in the 1970's; quit chewing ~ 2000"  . Alcohol Use: No  . Drug Use: No  . Sexual Activity: No   Other Topics Concern  . Not on file   Social History Narrative     No Known Allergies   Outpatient Prescriptions Prior to Visit  Medication Sig Dispense Refill  . acetaminophen (TYLENOL) 325 MG tablet Take 2 tablets (650 mg total) by mouth every 4 (four) hours as needed for headache or mild pain.    Marland Kitchen aspirin EC 81 MG tablet Take 1 tablet (81 mg total) by mouth daily. 30 tablet 12  . BD PEN NEEDLE NANO U/F 32G X 4 MM MISC USE WITH INSULIN ADMINISTRATION DAILY  5  . Cyanocobalamin (VITAMIN B 12 PO) Take 2,500 mg by mouth daily.     . dabigatran (PRADAXA)  150 MG CAPS capsule TAKE ONE CAPSULE BY MOUTH EVERY 12 HOURS 60 capsule 6  . diltiazem (CARDIZEM CD) 240 MG 24 hr capsule Take 1 capsule (240 mg total) by mouth daily. 30 capsule 6  . feeding supplement, ENSURE ENLIVE, (ENSURE ENLIVE) LIQD Take 237 mLs by mouth 2 (two) times daily between meals. 237 mL 12  . glimepiride (AMARYL) 4 MG tablet Take 4 mg by mouth 2 (two) times daily.    . isosorbide mononitrate (IMDUR) 30 MG 24 hr tablet Take 0.5 tablets (15 mg total) by mouth daily. 30 tablet 5  . LANTUS SOLOSTAR 100 UNIT/ML Solostar Pen USE 20 UNITS AT BEDTIME  3  . lisinopril (PRINIVIL,ZESTRIL) 20 MG tablet Take 20 mg by mouth daily.    . metFORMIN (GLUCOPHAGE-XR) 500 MG 24 hr tablet Take 2 tablets (1,000 mg total) by mouth 2 (two) times daily.    . metoprolol tartrate (LOPRESSOR) 25 MG tablet Take 1 tablet (25 mg total) by mouth 2 (two) times daily. 60 tablet 11  . nitroGLYCERIN (NITROSTAT) 0.4 MG SL tablet Place 1 tablet (0.4 mg total) under the tongue every 5 (five) minutes as needed for chest pain. 25 tablet 2  . pravastatin (PRAVACHOL) 80 MG tablet Take 80 mg by mouth daily.    Gilman Schmidt TEST test strip 1 each by Other route 2 (two) times daily.   4   No facility-administered medications prior to visit.       Review of Systems  Constitutional: Negative for fever and unexpected weight change.  HENT: Positive for congestion. Negative for dental problem, ear pain, nosebleeds, postnasal drip, rhinorrhea, sinus pressure, sneezing, sore throat and trouble swallowing.   Eyes: Negative for redness and itching.  Respiratory: Positive for cough and shortness of breath. Negative for chest tightness and wheezing.   Cardiovascular: Negative for palpitations and leg swelling.  Gastrointestinal: Negative for nausea and vomiting.  Genitourinary: Negative for dysuria.  Musculoskeletal: Negative for joint swelling.  Skin: Negative for rash.  Neurological: Negative for headaches.  Hematological:  Does not bruise/bleed easily.  Psychiatric/Behavioral: Negative for dysphoric mood. The patient is not nervous/anxious.        Objective:   Physical Exam Filed Vitals:   08/21/14 1519  BP: 146/68  Pulse: 52  Height: 5' 10.5" (1.791 m)  Weight: 209 lb (94.802 kg)  SpO2: 97%   Room air  Ambulated 150 feet on room air in clinic and O2 saturation remained around 97% with the exception of a very brief  drop to 89% for 1-2 seconds and then recovered to 97% for the remainder of the walk  Gen: Elderly, in wheelchair  no acute distress HENT: NCAT, OP clear, neck supple without masses Eyes: PERRL, EOMi Lymph: no cervical lymphadenopathy PULM: CTA B CV: RRR, systolic murmur, no JVD GI: BS+, soft, nontender, no hsm Derm: no rash or skin breakdown MSK: normal bulk and tone Neuro: A&Ox4, CN II-XII intact, strength 5/5 in all 4 extremities, resting upper extremity tremor noted Psyche: normal mood and affect   07/2014 CXR > images personally reviewed, perhaps mild chronic bronchitis changes but otherwise lung parenchyma is within normal limits     Assessment & Plan:  Dyspnea He has severe dyspnea which is quite a problem. He can only walk about 10-15 steps before having to stop to rest.  I explained to him and his family today that the differential diagnosis of dyspnea is broad and includes lung disease, heart disease, hematologic disease, and neurologic disease among others. In his particular case I think that his multitude of cardiac problems complicated by progressive neuromuscular weakness and deconditioning are the most likely causes for his shortness of breath. He has a minimal smoking history, normal lung exam, and normal ambulatory oxygen saturation on room air today. However, we will check a pulmonary function test to ensure there is no evidence of lung disease. I have personally reviewed the images from his chest x-ray from last month and I do not see clear evidence of lung disease with  the exception of perhaps some mild bronchitis changes.  Plan: Trial of albuterol as needed Pulmonary function testing Depending on results of pulmonary function testing we will determine if more imaging is needed Continue treatment for an follow-up for his multiple cardiac issues.  CAD in native artery- severe- medical Rx I have reviewed the hospitalization records from last month where he was found to have two-vessel coronary artery disease with mild cardiomyopathy. As noted above, I think this is the primary cause for his dyspnea. We will evaluate further for lung disease with a pulmonary function test.  He has some mild cough and is on an ACE inhibitor, but considering the severity of his heart disease think it is reasonable to continue the ACE inhibitor for now.  Plan: Continue current medical regimen     Current outpatient prescriptions:  .  acetaminophen (TYLENOL) 325 MG tablet, Take 2 tablets (650 mg total) by mouth every 4 (four) hours as needed for headache or mild pain., Disp: , Rfl:  .  aspirin EC 81 MG tablet, Take 1 tablet (81 mg total) by mouth daily., Disp: 30 tablet, Rfl: 12 .  BD PEN NEEDLE NANO U/F 32G X 4 MM MISC, USE WITH INSULIN ADMINISTRATION DAILY, Disp: , Rfl: 5 .  Cyanocobalamin (VITAMIN B 12 PO), Take 2,500 mg by mouth daily. , Disp: , Rfl:  .  dabigatran (PRADAXA) 150 MG CAPS capsule, TAKE ONE CAPSULE BY MOUTH EVERY 12 HOURS, Disp: 60 capsule, Rfl: 6 .  diltiazem (CARDIZEM CD) 240 MG 24 hr capsule, Take 1 capsule (240 mg total) by mouth daily., Disp: 30 capsule, Rfl: 6 .  feeding supplement, ENSURE ENLIVE, (ENSURE ENLIVE) LIQD, Take 237 mLs by mouth 2 (two) times daily between meals., Disp: 237 mL, Rfl: 12 .  glimepiride (AMARYL) 4 MG tablet, Take 4 mg by mouth 2 (two) times daily., Disp: , Rfl:  .  isosorbide mononitrate (IMDUR) 30 MG 24 hr tablet, Take 0.5 tablets (15 mg total) by mouth  daily., Disp: 30 tablet, Rfl: 5 .  LANTUS SOLOSTAR 100 UNIT/ML Solostar  Pen, USE 20 UNITS AT BEDTIME, Disp: , Rfl: 3 .  lisinopril (PRINIVIL,ZESTRIL) 20 MG tablet, Take 20 mg by mouth daily., Disp: , Rfl:  .  metFORMIN (GLUCOPHAGE-XR) 500 MG 24 hr tablet, Take 2 tablets (1,000 mg total) by mouth 2 (two) times daily., Disp: , Rfl:  .  metoprolol tartrate (LOPRESSOR) 25 MG tablet, Take 1 tablet (25 mg total) by mouth 2 (two) times daily., Disp: 60 tablet, Rfl: 11 .  nitroGLYCERIN (NITROSTAT) 0.4 MG SL tablet, Place 1 tablet (0.4 mg total) under the tongue every 5 (five) minutes as needed for chest pain., Disp: 25 tablet, Rfl: 2 .  pravastatin (PRAVACHOL) 80 MG tablet, Take 80 mg by mouth daily., Disp: , Rfl:  .  TRUETRACK TEST test strip, 1 each by Other route 2 (two) times daily. , Disp: , Rfl: 4 .  Albuterol Sulfate (PROAIR RESPICLICK) 108 (90 BASE) MCG/ACT AEPB, Inhale 1 puff into the lungs every 6 (six) hours as needed (shortness of breath)., Disp: 1 each, Rfl: 0

## 2014-08-21 NOTE — Patient Instructions (Signed)
We will arrange a lung function test and call you with the results Use the albuterol inhaler before exercise or every 6 hours as needed for shortness of breath We will see you back in 3-4 weeks or sooner if needed

## 2014-08-21 NOTE — Assessment & Plan Note (Addendum)
He has severe dyspnea which is quite a problem. He can only walk about 10-15 steps before having to stop to rest.  I explained to him and his family today that the differential diagnosis of dyspnea is broad and includes lung disease, heart disease, hematologic disease, and neurologic disease among others. In his particular case I think that his multitude of cardiac problems complicated by progressive neuromuscular weakness and deconditioning are the most likely causes for his shortness of breath. He has a minimal smoking history, normal lung exam, and normal ambulatory oxygen saturation on room air today. However, we will check a pulmonary function test to ensure there is no evidence of lung disease. I have personally reviewed the images from his chest x-ray from last month and I do not see clear evidence of lung disease with the exception of perhaps some mild bronchitis changes.  He and his family note progressive neuromuscular weakness. This can also contribute to shortness of breath. However, his neurologic exam seemed within normal limits to me today with the exception of a mild resting tremor.  Plan: Trial of albuterol as needed Pulmonary function testing Depending on results of pulmonary function testing we will determine if more imaging is needed Neurology consult Continue treatment for an follow-up for his multiple cardiac issues.

## 2014-08-25 ENCOUNTER — Ambulatory Visit (HOSPITAL_COMMUNITY)
Admission: RE | Admit: 2014-08-25 | Discharge: 2014-08-25 | Disposition: A | Discharge status: Home / Self Care | Payer: Medicare Other | Source: Ambulatory Visit | Attending: Pulmonary Disease | Admitting: Pulmonary Disease

## 2014-08-25 DIAGNOSIS — R0600 Dyspnea, unspecified: Secondary | ICD-10-CM | POA: Insufficient documentation

## 2014-08-25 LAB — PULMONARY FUNCTION TEST
DL/VA % pred: 61 %
DL/VA: 2.9 ml/min/mmHg/L
DLCO UNC % PRED: 38 %
DLCO unc: 13.99 ml/min/mmHg
FEF 25-75 POST: 1.17 L/s
FEF 25-75 Pre: 1.34 L/sec
FEF2575-%Change-Post: -13 %
FEF2575-%Pred-Post: 59 %
FEF2575-%Pred-Pre: 67 %
FEV1-%Change-Post: 1 %
FEV1-%PRED-POST: 70 %
FEV1-%Pred-Pre: 69 %
FEV1-Post: 2.12 L
FEV1-Pre: 2.09 L
FEV1FVC-%CHANGE-POST: 5 %
FEV1FVC-%Pred-Pre: 85 %
FEV6-%CHANGE-POST: 0 %
FEV6-%Pred-Post: 79 %
FEV6-%Pred-Pre: 80 %
FEV6-POST: 3.18 L
FEV6-PRE: 3.21 L
FEV6FVC-%CHANGE-POST: 2 %
FEV6FVC-%PRED-PRE: 103 %
FEV6FVC-%Pred-Post: 105 %
FVC-%Change-Post: -3 %
FVC-%PRED-POST: 78 %
FVC-%PRED-PRE: 81 %
FVC-Post: 3.36 L
FVC-Pre: 3.49 L
POST FEV1/FVC RATIO: 63 %
POST FEV6/FVC RATIO: 98 %
Pre FEV1/FVC ratio: 60 %
Pre FEV6/FVC Ratio: 96 %
RV % PRED: 97 %
RV: 2.86 L
TLC % pred: 78 %
TLC: 6.01 L

## 2014-08-25 MED ORDER — ALBUTEROL SULFATE (2.5 MG/3ML) 0.083% IN NEBU
2.5000 mg | INHALATION_SOLUTION | Freq: Once | RESPIRATORY_TRACT | Status: AC
  Administered 2014-08-25: 2.5 mg via RESPIRATORY_TRACT

## 2014-09-02 NOTE — Progress Notes (Signed)
Quick Note:  LVM for pt to return call ______ 

## 2014-09-03 ENCOUNTER — Telehealth: Payer: Self-pay | Admitting: Pulmonary Disease

## 2014-09-03 NOTE — Telephone Encounter (Signed)
Per PFT result note: Please let him know that his Lung function test showed COPD that was not severe. Based on this and all the shortness of breath he has been having, I want him to have a HRCT read by Entrikin or Bleitz for dyspnea to assess for ILD. ---  Called spoke with pt.a ware of results. He wants to hold off for now on HRCT and reports when he is ready to get this done he would call us back. FYI for Dr. Kendrick Fries

## 2014-09-03 NOTE — Progress Notes (Signed)
Quick Note:  LVM for pt to return call ______ 

## 2014-09-03 NOTE — Telephone Encounter (Signed)
Received call back from patient - states that someone called him but he could not understand a word she was saying.  I attempted to explain the results below and rec's several times - patient stated that he could not understand me either, said that it is the tone of our voices, his hearing is impaired.  I attempted again to review results speaking very slow and louder and the patient still sat silent on the other end with no response to my question of if he understood the results I was explaining to him. Patient is requesting that Dr Kendrick Fries call him - maybe he will be able to understand better.  Please advise. Thanks.

## 2014-09-04 ENCOUNTER — Other Ambulatory Visit: Payer: Self-pay | Admitting: Cardiovascular Disease

## 2014-09-04 ENCOUNTER — Encounter (HOSPITAL_COMMUNITY): Payer: Self-pay | Admitting: Emergency Medicine

## 2014-09-04 ENCOUNTER — Emergency Department (HOSPITAL_COMMUNITY)
Admission: EM | Admit: 2014-09-04 | Discharge: 2014-09-04 | Disposition: A | Discharge status: Home / Self Care | Payer: Medicare Other | Attending: Emergency Medicine | Admitting: Emergency Medicine

## 2014-09-04 ENCOUNTER — Emergency Department (HOSPITAL_COMMUNITY): Payer: Medicare Other

## 2014-09-04 ENCOUNTER — Encounter: Payer: Medicare Other | Admitting: Nurse Practitioner

## 2014-09-04 DIAGNOSIS — Z8673 Personal history of transient ischemic attack (TIA), and cerebral infarction without residual deficits: Secondary | ICD-10-CM | POA: Insufficient documentation

## 2014-09-04 DIAGNOSIS — R2240 Localized swelling, mass and lump, unspecified lower limb: Secondary | ICD-10-CM | POA: Insufficient documentation

## 2014-09-04 DIAGNOSIS — Z79899 Other long term (current) drug therapy: Secondary | ICD-10-CM | POA: Insufficient documentation

## 2014-09-04 DIAGNOSIS — I481 Persistent atrial fibrillation: Secondary | ICD-10-CM

## 2014-09-04 DIAGNOSIS — E780 Pure hypercholesterolemia: Secondary | ICD-10-CM | POA: Diagnosis not present

## 2014-09-04 DIAGNOSIS — M1990 Unspecified osteoarthritis, unspecified site: Secondary | ICD-10-CM | POA: Diagnosis not present

## 2014-09-04 DIAGNOSIS — R0602 Shortness of breath: Secondary | ICD-10-CM | POA: Insufficient documentation

## 2014-09-04 DIAGNOSIS — I200 Unstable angina: Secondary | ICD-10-CM

## 2014-09-04 DIAGNOSIS — R079 Chest pain, unspecified: Secondary | ICD-10-CM | POA: Diagnosis present

## 2014-09-04 DIAGNOSIS — Z7982 Long term (current) use of aspirin: Secondary | ICD-10-CM | POA: Diagnosis not present

## 2014-09-04 DIAGNOSIS — I5033 Acute on chronic diastolic (congestive) heart failure: Secondary | ICD-10-CM | POA: Diagnosis not present

## 2014-09-04 DIAGNOSIS — I209 Angina pectoris, unspecified: Secondary | ICD-10-CM | POA: Diagnosis not present

## 2014-09-04 DIAGNOSIS — Z87891 Personal history of nicotine dependence: Secondary | ICD-10-CM | POA: Diagnosis not present

## 2014-09-04 DIAGNOSIS — Z8701 Personal history of pneumonia (recurrent): Secondary | ICD-10-CM | POA: Insufficient documentation

## 2014-09-04 DIAGNOSIS — I10 Essential (primary) hypertension: Secondary | ICD-10-CM | POA: Diagnosis not present

## 2014-09-04 DIAGNOSIS — G8929 Other chronic pain: Secondary | ICD-10-CM | POA: Insufficient documentation

## 2014-09-04 DIAGNOSIS — E119 Type 2 diabetes mellitus without complications: Secondary | ICD-10-CM | POA: Insufficient documentation

## 2014-09-04 LAB — BASIC METABOLIC PANEL
Anion gap: 11 (ref 5–15)
BUN: 26 mg/dL — AB (ref 6–20)
CHLORIDE: 103 mmol/L (ref 101–111)
CO2: 24 mmol/L (ref 22–32)
Calcium: 9.6 mg/dL (ref 8.9–10.3)
Creatinine, Ser: 1.26 mg/dL — ABNORMAL HIGH (ref 0.61–1.24)
GFR calc Af Amer: 58 mL/min — ABNORMAL LOW (ref >60)
GFR calc non Af Amer: 50 mL/min — ABNORMAL LOW (ref >60)
Glucose, Bld: 118 mg/dL — ABNORMAL HIGH (ref 65–99)
Potassium: 4.4 mmol/L (ref 3.5–5.1)
Sodium: 138 mmol/L (ref 135–145)

## 2014-09-04 LAB — CBC
HEMATOCRIT: 36.9 % — AB (ref 39.0–52.0)
Hemoglobin: 12.6 g/dL — ABNORMAL LOW (ref 13.0–17.0)
MCH: 28.9 pg (ref 26.0–34.0)
MCHC: 34.1 g/dL (ref 30.0–36.0)
MCV: 84.6 fL (ref 78.0–100.0)
Platelets: 189 10*3/uL (ref 150–400)
RBC: 4.36 MIL/uL (ref 4.22–5.81)
RDW: 15 % (ref 11.5–15.5)
WBC: 8.8 10*3/uL (ref 4.0–10.5)

## 2014-09-04 LAB — APTT: aPTT: 128 seconds — ABNORMAL HIGH (ref 24–37)

## 2014-09-04 LAB — PROTIME-INR
INR: 1.79 — ABNORMAL HIGH (ref 0.00–1.49)
Prothrombin Time: 20.7 seconds — ABNORMAL HIGH (ref 11.6–15.2)

## 2014-09-04 LAB — TROPONIN I: Troponin I: <0.03 ng/mL (ref <0.031)

## 2014-09-04 MED ORDER — NITROGLYCERIN 2 % TD OINT
1.0000 [in_us] | TOPICAL_OINTMENT | Freq: Once | TRANSDERMAL | Status: AC
  Administered 2014-09-04: 1 [in_us] via TOPICAL
  Filled 2014-09-04: qty 1

## 2014-09-04 MED ORDER — ISOSORBIDE MONONITRATE ER 60 MG PO TB24: 60.0000 mg | ORAL_TABLET | Freq: Every day | ORAL | Status: DC

## 2014-09-04 MED ORDER — NITROGLYCERIN 0.4 MG SL SUBL: 0.4000 mg | SUBLINGUAL_TABLET | SUBLINGUAL | Status: DC | PRN

## 2014-09-04 MED ORDER — ASPIRIN 81 MG PO CHEW
243.0000 mg | CHEWABLE_TABLET | Freq: Once | ORAL | Status: AC
  Administered 2014-09-04: 243 mg via ORAL
  Filled 2014-09-04: qty 3

## 2014-09-04 NOTE — ED Notes (Signed)
Dr. Jacubowitz at bedside 

## 2014-09-04 NOTE — ED Provider Notes (Signed)
CSN: [id number]     Arrival date & time 09/04/14  4098 History   First MD Initiated Contact with Patient 09/04/14 [phone]     Chief Complaint  Patient presents with  . Chest Pain     (Consider location/radiation/quality/duration/timing/severity/associated sxs/prior Treatment) HPI  Frank Marks is an 81yo M with PMH of HTN, CVA, DM, Afib on diltiazem and pradaxa, mild aortic stenosis, and chronic dyspnea who presents with left sided sharp chest pain that started around 4am this morning and lasted in 30 second episodes. It is non-radiating. He had these similar chest pains the day of his heart catheter last month where he required beta blocker premedication due to increased AFib, and has not has these since. He is short of breath at baseline but does not feel that his shortness of breath is worse today. He does not have nausea, vomiting, diaphoresis, lightheadedness, or any other symptoms. Nothing aggravates his symptoms, but rest and nitro relieve it. He took two SL nitro tablets this morning which relieved his pain, as well as a baby aspirin. He is still having mild discomfort now. His shortness of breath has progressively worsened over the past few months, prompting a cath last month which revealed severe 2 vessel CAD with an EF of 45-50%. PFTs were unrevealing, and he has an appointment for HR-CT for workup of interstitial lung disease.  Past Medical History  Diagnosis Date  . Hypertension   . Hypercholesterolemia   . Stroke   . Chronic back pain   . Hx of cardiovascular stress test     Lex MV 3/14:  Not gated, apical thinning, no ischemia.   Marland Kitchen Hx of echocardiogram     Echo 04/24/12: mild LVH, EF 50-55%, mild AS (mean 9), MAC, mild MR, mod-severe BAE, mod TR, PASP 39  . Acute on chronic diastolic CHF (congestive heart failure), NYHA class 3 07/29/2014  . TIA (transient ischemic attack) 06/2007    "had 2-3 little ones around that time"  . Carotid artery stenosis   . Chronic atrial fibrillation    . Type II diabetes mellitus   . Osteoarthritis   . Arthritis     "back" (07/29/2014)  . Chronic lower back pain   . Exertional shortness of breath     chronic/notes 07/29/2014  . Pneumonia ~ 1951; 02/2014   Past Surgical History  Procedure Laterality Date  . Lumbar laminectomy/decompression microdiscectomy  1990's X 1; 2000's X1  . Knee arthroscopy Left   . Back surgery    . Tonsillectomy    . Appendectomy    . Replacement total knee Bilateral ~ 2001-2002  . Total hip arthroplasty Left ~ 2003  . Joint replacement    . Cataract extraction w/ intraocular lens  implant, bilateral Bilateral   . Cardiac catheterization  07/29/2014  . Cardiac catheterization N/A 07/29/2014    Procedure: Left Heart Cath and Coronary Angiography;  Surgeon: [name], MD;  Location: [facility] INVASIVE CV LAB;  Service: Cardiovascular;  Laterality: N/A;   Family History  Problem Relation Age of Onset  . Other      no family hx of coronary artery disease  . Stroke Father   . Healthy Mother   . Arthritis Mother   . Alzheimer's disease Mother    History  Substance Use Topics  . Smoking status: Former Smoker -- 1.00 packs/day for 5 years    Types: Cigarettes    Quit date: 08/20/1984  . Smokeless tobacco: Former Neurosurgeon    Types: Sports administrator  Comment: "stopped smoking cigarettes in the 1970's; quit chewing ~ 2000"  . Alcohol Use: No    Review of Systems  Constitutional: Negative for fever and chills.  HENT: Negative for mouth sores and sore throat.   Eyes: Negative for visual disturbance.  Respiratory: Positive for shortness of breath. Negative for cough.   Cardiovascular: Positive for chest pain and leg swelling. Negative for palpitations.  Gastrointestinal: Negative for nausea, vomiting and abdominal pain.  Endocrine: Negative for polyuria.  Genitourinary: Negative for dysuria and flank pain.  Musculoskeletal: Negative for myalgias and back pain.  Skin: Negative for rash.  Neurological: Negative for  weakness and headaches.  Hematological: Does not bruise/bleed easily.  Psychiatric/Behavioral: Negative for behavioral problems and agitation.  All other systems reviewed and are negative.     Allergies  Review of patient's allergies indicates no known allergies.  Home Medications   Prior to Admission medications   Medication Sig Start Date End Date Taking? Authorizing Provider  acetaminophen (TYLENOL) 325 MG tablet Take 2 tablets (650 mg total) by mouth every 4 (four) hours as needed for headache or mild pain. 07/30/14   Abelino Derrick, PA-C  Albuterol Sulfate (PROAIR RESPICLICK) 108 (90 BASE) MCG/ACT AEPB Inhale 1 puff into the lungs every 6 (six) hours as needed (shortness of breath). 08/21/14   Lupita Leash, MD  aspirin EC 81 MG tablet Take 1 tablet (81 mg total) by mouth daily. 12/06/11   [name], MD  BD PEN NEEDLE NANO U/F 32G X 4 MM MISC USE WITH INSULIN ADMINISTRATION DAILY 07/05/14   Historical Provider, MD  Cyanocobalamin (VITAMIN B 12 PO) Take 2,500 mg by mouth daily.     Historical Provider, MD  dabigatran (PRADAXA) 150 MG CAPS capsule TAKE ONE CAPSULE BY MOUTH EVERY 12 HOURS 07/30/14   Abelino Derrick, PA-C  diltiazem (CARDIZEM CD) 240 MG 24 hr capsule Take 1 capsule (240 mg total) by mouth daily. 07/23/14   [name], MD  feeding supplement, ENSURE ENLIVE, (ENSURE ENLIVE) LIQD Take 237 mLs by mouth 2 (two) times daily between meals. 07/30/14   Abelino Derrick, PA-C  glimepiride (AMARYL) 4 MG tablet Take 4 mg by mouth 2 (two) times daily. 01/17/14   Historical Provider, MD  isosorbide mononitrate (IMDUR) 30 MG 24 hr tablet Take 0.5 tablets (15 mg total) by mouth daily. 07/30/14   Abelino Derrick, PA-C  LANTUS SOLOSTAR 100 UNIT/ML Solostar Pen USE 20 UNITS AT BEDTIME 05/07/14   Historical Provider, MD  lisinopril (PRINIVIL,ZESTRIL) 20 MG tablet Take 20 mg by mouth daily. 01/17/14   Historical Provider, MD  metFORMIN (GLUCOPHAGE-XR) 500 MG 24 hr tablet Take 2 tablets (1,000 mg  total) by mouth 2 (two) times daily. 08/01/14   Abelino Derrick, PA-C  metoprolol tartrate (LOPRESSOR) 25 MG tablet Take 1 tablet (25 mg total) by mouth 2 (two) times daily. 07/30/14   Abelino Derrick, PA-C  nitroGLYCERIN (NITROSTAT) 0.4 MG SL tablet Place 1 tablet (0.4 mg total) under the tongue every 5 (five) minutes as needed for chest pain. 07/30/14   Abelino Derrick, PA-C  pravastatin (PRAVACHOL) 80 MG tablet Take 80 mg by mouth daily. 01/30/14   Historical Provider, MD  TRUETRACK TEST test strip 1 each by Other route 2 (two) times daily.  06/17/14   Historical Provider, MD   BP 151/80 mmHg  Pulse 60  Temp(Src) 98 F (36.7 C)  Resp 18  Ht  (1.854 m)  Wt 208 lb (94.348  kg)  BMI 27.45 kg/m2  SpO2 100% Physical Exam  Constitutional: He is oriented to person, place, and time. He appears well-developed and well-nourished. No distress.  HENT:  Head: Normocephalic and atraumatic.  Mouth/Throat: Oropharynx is clear and moist. No oropharyngeal exudate.  Eyes: Conjunctivae are normal. Pupils are equal, round, and reactive to light.  Neck: Normal range of motion. Neck supple.  Cardiovascular: Normal rate, normal heart sounds and intact distal pulses.  Exam reveals no gallop and no friction rub.   No murmur heard. Irregularly irregular rhythm  Pulmonary/Chest: Effort normal and breath sounds normal. No respiratory distress. He has no wheezes.  Abdominal: Soft. He exhibits no distension and no mass. There is no tenderness. There is no rebound and no guarding.  Musculoskeletal: Normal range of motion. He exhibits edema. He exhibits no tenderness.  Pitting edema up to just below the calves bilaterally.  Neurological: He is alert and oriented to person, place, and time. No cranial nerve deficit. Coordination normal.  Skin: Skin is warm and dry. He is not diaphoretic.  Psychiatric: He has a normal mood and affect. His behavior is normal.  Nursing note and vitals reviewed.   ED Course  Procedures  (including critical care time) Labs Review Labs Reviewed  BASIC METABOLIC PANEL  CBC  TROPONIN I  APTT  PROTIME-INR    Imaging Review Dg Chest 2 View  09/04/2014   CLINICAL DATA:  Left-sided chest pain this morning.  EXAM: CHEST  2 VIEW  COMPARISON:  07/23/2014  FINDINGS: Cardiac silhouette is upper limits of normal in size, unchanged. Thoracic aortic calcification is again seen. The lungs are well inflated and clear. No pleural effusion or pneumothorax. No acute osseous abnormality.  IMPRESSION: No active cardiopulmonary disease.   Electronically Signed   By: Sebastian Ache   On: 09/04/2014 07:18     EKG Interpretation   Date/Time:  Friday September 04 2014 06:43:39 EDT Ventricular Rate:  64 PR Interval:    QRS Duration: 120 QT Interval:  419 QTC Calculation: 432 R Axis:   -70 Text Interpretation:  Atrial fibrillation Ventricular premature complex  Incomplete left bundle branch block Anterior Q waves no new changes  Confirmed by Rhunette Croft, MD, Janey Genta [phone]) on 09/04/2014 6:45:45 AM      MDM   Final diagnoses:  None    Frank Marks is an 81yo M with PMH of HTN, CVA, DM, Afib on diltiazem and pradaxa, mild aortic stenosis, and chronic dyspnea who presents with left sided sharp chest pain that started around 4am this morning and lasted in 30 second episodes, non-radiating. Physical exam is revealing only for irregularly irregular rhythm. His EKG shows active afib with rate of 64, CXR is unremarkable. Troponins are negative. Symptoms most likely due to afib. Gave patient 3 more ASA 81mg  as well as nitro paste. Consulted cardiology, who rec'd hospitalization but patient declined. Increased his imdur with symptomatic relief. Patient discharged home.    Darrick Huntsman, MD 09/04/14 6045  Darrick Huntsman, MD 09/04/14 1012  Doug Sou, MD 09/04/14 1534

## 2014-09-04 NOTE — Telephone Encounter (Signed)
Noted thanks °

## 2014-09-04 NOTE — Telephone Encounter (Signed)
Spoke with patients son, he will contact patient and see if he wants to do the HRCT.  Patients son says that patient is getting agitated at having to do so many tests.   FYI - Dr. Kendrick Fries: Patient was in the ED this morning for chest pain, they ruled out heart attack.  He has been discharged.

## 2014-09-04 NOTE — ED Notes (Signed)
Patient transported to X-ray 

## 2014-09-04 NOTE — Telephone Encounter (Signed)
No, we just need to speak with his family (daughter and son were with them) as he won't be able to hear me either.  Please try to call one of them.

## 2014-09-04 NOTE — ED Notes (Signed)
Pt reports left sided CP that began at 4am this morning; pt denies radiation; pt denies n/v, sweating, abd pain, lightheadedness; pt reports SOB which is normal for him; pt reports taking 2 SL Nitro with relief

## 2014-09-04 NOTE — Progress Notes (Signed)
Pt was seen in the ER for angina See consult note I have increased his Imdur to 60 mg a day and increased the number of NTG that he gets each time to 100.  He will need a visit with Dr Excell Seltzer or PA in several weeks.    Nahser, Deloris Ping, MD  09/04/2014 8:34 AM    [facility] [address] [place],  [address] [place], [place]  [address] Pager [phone] Phone: [phone]; Fax: [phone]   [facility]  [address] [place], [place]  [address] [phone]   Fax [phone]

## 2014-09-04 NOTE — Consult Note (Signed)
Consult Note    Date: 09/04/2014               Patient Name:  Frank Marks MRN: [id number]  DOB: [birth year] Age / Sex: 81 y.o., male        PCP: Ailene Ravel Primary Cardiologist: Excell Seltzer         History of Present Illness: Patient is a 81yo M with PMH of HTN, CVA, DM, Afib on diltiazem and pradaxa, mild aortic stenosis, and chronic dyspnea who presents with left sided sharp chest pain that started around 4am this morning and lasted in 30 second episodes who was admitted to [facility] on 09/04/2014 for evaluation of chest pain.  Pain started at 4:30, Left sided chest, no radiation. Has chronic dyspnea -not worsened with CP  2 sharp pains that lasted 30 seconds each,  Then 20 minutes of a pressure like pain Took 2 NTG which helped.  Started to return and he was brought to the ER. Was sent home on low dose Imdur   Currently pain free with NTP. He wants to go home   Medications: Outpatient medications: No current facility-administered medications for this encounter.   Current Outpatient Prescriptions  Medication Sig Dispense Refill  . acetaminophen (TYLENOL) 325 MG tablet Take 2 tablets (650 mg total) by mouth every 4 (four) hours as needed for headache or mild pain.    . Albuterol Sulfate (PROAIR RESPICLICK) 108 (90 BASE) MCG/ACT AEPB Inhale 1 puff into the lungs every 6 (six) hours as needed (shortness of breath). 1 each 0  . aspirin EC 81 MG tablet Take 1 tablet (81 mg total) by mouth daily. 30 tablet 12  . BD PEN NEEDLE NANO U/F 32G X 4 MM MISC USE WITH INSULIN ADMINISTRATION DAILY  5  . Cyanocobalamin (VITAMIN B 12 PO) Take 2,500 mg by mouth daily.     . dabigatran (PRADAXA) 150 MG CAPS capsule TAKE ONE CAPSULE BY MOUTH EVERY 12 HOURS 60 capsule 6  . diltiazem (CARDIZEM CD) 240 MG 24 hr capsule Take 1 capsule (240 mg total) by mouth daily. 30 capsule 6  . feeding supplement, ENSURE ENLIVE, (ENSURE ENLIVE) LIQD Take 237 mLs by mouth 2 (two) times daily between meals.  237 mL 12  . glimepiride (AMARYL) 4 MG tablet Take 4 mg by mouth 2 (two) times daily.    . isosorbide mononitrate (IMDUR) 30 MG 24 hr tablet Take 0.5 tablets (15 mg total) by mouth daily. 30 tablet 5  . LANTUS SOLOSTAR 100 UNIT/ML Solostar Pen USE 20 UNITS AT BEDTIME  3  . lisinopril (PRINIVIL,ZESTRIL) 20 MG tablet Take 20 mg by mouth daily.    . metFORMIN (GLUCOPHAGE-XR) 500 MG 24 hr tablet Take 2 tablets (1,000 mg total) by mouth 2 (two) times daily.    . metoprolol tartrate (LOPRESSOR) 25 MG tablet Take 1 tablet (25 mg total) by mouth 2 (two) times daily. 60 tablet 11  . nitroGLYCERIN (NITROSTAT) 0.4 MG SL tablet Place 1 tablet (0.4 mg total) under the tongue every 5 (five) minutes as needed for chest pain. 25 tablet 2  . pravastatin (PRAVACHOL) 80 MG tablet Take 80 mg by mouth daily.    Gilman Schmidt TEST test strip 1 each by Other route 2 (two) times daily.   4    (Not in a hospital admission)  No Known Allergies   Past Medical History  Diagnosis Date  . Hypertension   . Hypercholesterolemia   . Stroke   .  Chronic back pain   . Hx of cardiovascular stress test     Lex MV 3/14:  Not gated, apical thinning, no ischemia.   Marland Kitchen Hx of echocardiogram     Echo 04/24/12: mild LVH, EF 50-55%, mild AS (mean 9), MAC, mild MR, mod-severe BAE, mod TR, PASP 39  . Acute on chronic diastolic CHF (congestive heart failure), NYHA class 3 07/29/2014  . TIA (transient ischemic attack) 06/2007    "had 2-3 little ones around that time"  . Carotid artery stenosis   . Chronic atrial fibrillation   . Type II diabetes mellitus   . Osteoarthritis   . Arthritis     "back" (07/29/2014)  . Chronic lower back pain   . Exertional shortness of breath     chronic/notes 07/29/2014  . Pneumonia ~ 1951; 02/2014    Past Surgical History  Procedure Laterality Date  . Lumbar laminectomy/decompression microdiscectomy  1990's X 1; 2000's X1  . Knee arthroscopy Left   . Back surgery    . Tonsillectomy    .  Appendectomy    . Replacement total knee Bilateral ~ 2001-2002  . Total hip arthroplasty Left ~ 2003  . Joint replacement    . Cataract extraction w/ intraocular lens  implant, bilateral Bilateral   . Cardiac catheterization  07/29/2014  . Cardiac catheterization N/A 07/29/2014    Procedure: Left Heart Cath and Coronary Angiography;  Surgeon: [name], MD;  Location: [facility] INVASIVE CV LAB;  Service: Cardiovascular;  Laterality: N/A;    Family History  Problem Relation Age of Onset  . Other      no family hx of coronary artery disease  . Stroke Father   . Healthy Mother   . Arthritis Mother   . Alzheimer's disease Mother     Social History:  reports that he quit smoking about 30 years ago. His smoking use included Cigarettes. He has a 5 pack-year smoking history. He has quit using smokeless tobacco. His smokeless tobacco use included Chew. He reports that he does not drink alcohol or use illicit drugs.   Review of Systems: Constitutional:  denies fever, chills, diaphoresis, appetite change and fatigue.  HEENT: denies photophobia, eye pain, redness, hearing loss, ear pain, congestion, sore throat, rhinorrhea, sneezing, neck pain, neck stiffness and tinnitus.  Respiratory: denies SOB, DOE, cough, chest tightness, and wheezing.  Cardiovascular: admits to chest pain, palpitations ,  Has some ankle / leg swelling.  Gastrointestinal: denies nausea, vomiting, abdominal pain, diarrhea, constipation, blood in stool.  Genitourinary: denies dysuria, urgency, frequency, hematuria, flank pain and difficulty urinating.  Musculoskeletal: denies  myalgias, back pain, joint swelling, arthralgias and gait problem.   Skin: denies pallor, rash and wound.  Neurological: denies dizziness, seizures, syncope, weakness, light-headedness, numbness and headaches.   Hematological: denies adenopathy, easy bruising, personal or family bleeding history.  Psychiatric/ Behavioral: denies suicidal ideation, mood  changes, confusion, nervousness, sleep disturbance and agitation.    Physical Exam: BP 137/72 mmHg  Pulse 74  Temp(Src) 98 F (36.7 C)  Resp 17  Ht 6\' 1"  (1.854 m)  Wt 94.348 kg (208 lb)  BMI 27.45 kg/m2  SpO2 98%  Wt Readings from Last 3 Encounters:  09/04/14 94.348 kg (208 lb)  08/21/14 94.802 kg (209 lb)  07/29/14 94.348 kg (208 lb)    General: Vital signs reviewed and noted. Well-developed, well-nourished, in no acute distress; alert,   Head: Normocephalic, atraumatic, sclera anicteric, mucus membranes are moist   Neck: Supple. Negative for carotid  bruits. JVD not elevated.   Lungs:  Clear bilaterally to auscultation without wheezes, rales, or rhonchi. Breathing is normal   Heart: Irreg. Irreg  with S1 S2. 2/6 systolic murmur, no  rubs, or gallops.   Abdomen:  Soft, non-tender, non-distended with normoactive bowel sounds. No hepatomegaly. No rebound/guarding. No obvious abdominal masses   MSK: Strength and the appear normal for age.   Extremities: No clubbing or cyanosis. No edema.  Distal pedal pulses are 2+ and equal bilaterally .  Neurologic: Alert and oriented X 3. Moves all extremities spontaneously , hard of hearing   Psych:  normal     Lab results: Basic Metabolic Panel: No results for input(s): NA, K, CL, CO2, GLUCOSE, BUN, CREATININE, CALCIUM, MG, PHOS in the last 168 hours.  Liver Function Tests: No results for input(s): AST, ALT, ALKPHOS, BILITOT, PROT, ALBUMIN in the last 168 hours. No results for input(s): LIPASE, AMYLASE in the last 168 hours.  CBC: No results for input(s): WBC, NEUTROABS, HGB, HCT, MCV, PLT in the last 168 hours.  Cardiac Enzymes: No results for input(s): CKTOTAL, CKMB, CKMBINDEX, TROPONINI in the last 168 hours.  BNP: Invalid input(s): POCBNP  CBG: No results for input(s): GLUCAP in the last 168 hours.  Coagulation Studies: No results for input(s): LABPROT, INR in the last 72 hours.   Other results: EKG  - personally  reviewed , atrial fib with controlled rate   Imaging: Dg Chest 2 View  09/04/2014   CLINICAL DATA:  Left-sided chest pain this morning.  EXAM: CHEST  2 VIEW  COMPARISON:  07/23/2014  FINDINGS: Cardiac silhouette is upper limits of normal in size, unchanged. Thoracic aortic calcification is again seen. The lungs are well inflated and clear. No pleural effusion or pneumothorax. No acute osseous abnormality.  IMPRESSION: No active cardiopulmonary disease.   Electronically Signed   By: Sebastian Ache   On: 09/04/2014 07:18       Assessment & Plan:  1. Unstable angina:  Pt presents with CP , woke him up from bed this am. He is now stable with NTG paste and wants to go home today . He has had a recent cath and has severe diffuse disease and was thought to be a relatively poor candidate for CABG due to advanced age.  Plan: Will increase Imdur to 60 mg a day. I encouraged him to take as many NTG as he needed to keep pain free.  OK to DC to home assuming Troponin is negative.   2. Atrial fib:    Rate is well controlled. Continue current meds  3. Aortic stenosis:  Mild AS , peripheral pulses are good ,  I do not think this is playing a role in his CP syndrome .    DVT PPX -    Alvia Grove., MD, [facility] 09/04/2014, 8:02 AM

## 2014-09-04 NOTE — ED Provider Notes (Addendum)
Complains of left-sided anterior chest pain onset 4:30 AM upon awakening today. Pain is described as pressure with to sharp chest pains lasting less than 30 seconds. Presently complains of mild pressure at anterior chest. He does admit to shortness of breath however breathing is at baseline for him no nausea or sweatiness. He treated himself with 2 sublingual nitroglycerin's with partial relief. On exam alert no distress HEENT exam no facial asymmetry neck supple no JVD no bruits heart irregularly irregular abdomen nondistended nontender extremities without edema Aspirin nitroglycerin paste ordered. I spoke with [facility] who will come to evaluate patient in no acute EKG changes  Chest x-ray viewed by me. Patient evaluated by Dr.Nahser the emergency department who increased patient's Imdur and arrange for follow-up in office as outpatient  910 am pt asymptomaticand pain free after treatment with ntg paste Results for orders placed or performed during the hospital encounter of 09/04/14  Basic metabolic panel  Result Value Ref Range   Sodium 138 135 - 145 mmol/L   Potassium 4.4 3.5 - 5.1 mmol/L   Chloride 103 101 - 111 mmol/L   CO2 24 22 - 32 mmol/L   Glucose, Bld 118 (H) 65 - 99 mg/dL   BUN 26 (H) 6 - 20 mg/dL   Creatinine, Ser 1.30 (H) 0.61 - 1.24 mg/dL   Calcium 9.6 8.9 - 86.5 mg/dL   GFR calc non Af Amer 50 (L) >60 mL/min   GFR calc Af Amer 58 (L) >60 mL/min   Anion gap 11 5 - 15  CBC  Result Value Ref Range   WBC 8.8 4.0 - 10.5 K/uL   RBC 4.36 4.22 - 5.81 MIL/uL   Hemoglobin 12.6 (L) 13.0 - 17.0 g/dL   HCT 78.4 (L) 69.6 - 29.5 %   MCV 84.6 78.0 - 100.0 fL   MCH 28.9 26.0 - 34.0 pg   MCHC 34.1 30.0 - 36.0 g/dL   RDW 28.4 13.2 - 44.0 %   Platelets 189 150 - 400 K/uL  Troponin I  Result Value Ref Range   Troponin I <0.03 <0.031 ng/mL  APTT  Result Value Ref Range   aPTT 128 (H) 24 - 37 seconds  Protime-INR  Result Value Ref Range   Prothrombin Time  20.7 (H) 11.6 - 15.2 seconds   INR 1.79 (H) 0.00 - 1.49   Dg Chest 2 View  09/04/2014   CLINICAL DATA:  Left-sided chest pain this morning.  EXAM: CHEST  2 VIEW  COMPARISON:  07/23/2014  FINDINGS: Cardiac silhouette is upper limits of normal in size, unchanged. Thoracic aortic calcification is again seen. The lungs are well inflated and clear. No pleural effusion or pneumothorax. No acute osseous abnormality.  IMPRESSION: No active cardiopulmonary disease.   Electronically Signed   By: Sebastian Ache   On: 09/04/2014 07:18     Doug Sou, MD 09/04/14 1027  Doug Sou, MD 09/04/14 [phone]

## 2014-09-04 NOTE — Discharge Instructions (Signed)
Angina Pectoris Call [facility] cardiology office to schedule follow-up appointment for within the next few weeks.  Angina pectoris, often just called angina, is extreme discomfort in your chest, neck, or arm caused by a lack of blood in the middle and thickest layer of your heart wall (myocardium). It may feel like tightness or heavy pressure. It may feel like a crushing or squeezing pain. Some people say it feels like gas or indigestion. It may go down your shoulders, back, and arms. Some people may have symptoms other than pain. These symptoms include fatigue, shortness of breath, cold sweats, or nausea. There are four different types of angina:  Stable angina--Stable angina usually occurs in episodes of predictable frequency and duration. It usually is brought on by physical activity, emotional stress, or excitement. These are all times when the myocardium needs more oxygen. Stable angina usually lasts a few minutes and often is relieved by taking a medicine that can be taken under your tongue (sublingually). The medicine is called nitroglycerin. Stable angina is caused by a buildup of plaque inside the arteries, which restricts blood flow to the heart muscle (atherosclerosis).  Unstable angina--Unstable angina can occur even when your body experiences little or no physical exertion. It can occur during sleep. It can also occur at rest. It can suddenly increase in severity or frequency. It might not be relieved by sublingual nitroglycerin. It can last up to 30 minutes. The most common cause of unstable angina is a blood clot that has developed on the top of plaque buildup inside a coronary artery. It can lead to a heart attack if the blood clot completely blocks the artery.  Microvascular angina--This type of angina is caused by a disorder of tiny blood vessels called arterioles. Microvascular angina is more common in women. The pain may be more severe and last longer than other types of  angina pectoris.  Prinzmetal or variant angina--This type of angina pectoris usually occurs when your body experiences little or no physical exertion. It especially occurs in the early morning hours. It is caused by a spasm of your coronary artery. HOME CARE INSTRUCTIONS   Only take over-the-counter and prescription medicines as directed by your health care provider.  Stay active or increase your exercise as directed by your health care provider.  Limit strenuous activity as directed by your health care provider.  Limit heavy lifting as directed by your health care provider.  Maintain a healthy weight.  Learn about and eat heart-healthy foods.  Do not use any tobacco products including cigarettes, chewing tobacco or electronic cigarettes. SEEK IMMEDIATE MEDICAL CARE IF:  You experience the following symptoms:  Chest, neck, deep shoulder, or arm pain or discomfort that lasts more than a few minutes.  Chest, neck, deep shoulder, or arm pain or discomfort that goes away and comes back, repeatedly.  Heavy sweating with discomfort, without a noticeable cause.  Shortness of breath or difficulty breathing.  Angina that does not get better after a few minutes of rest or after taking sublingual nitroglycerin. These can all be symptoms of a heart attack, which is a medical emergency! Get medical help at once. Call your local emergency service (911 in [place] immediately. Do not  drive yourself to the hospital and do not  wait to for your symptoms to go away. MAKE SURE YOU:  Understand these instructions.  Will watch your condition.  Will get help right away if you are not doing well or get worse. Document Released:  01/30/2005 Document Revised: 02/04/2013 Document Reviewed: 06/03/2013 [facility] Patient Information 2015 [place], [place]. This information is not intended to replace advice given to you by your health care provider. Make sure you discuss any questions you have with your health  care provider.

## 2014-09-07 ENCOUNTER — Other Ambulatory Visit: Payer: Self-pay

## 2014-09-07 ENCOUNTER — Other Ambulatory Visit: Payer: Self-pay | Admitting: Nurse Practitioner

## 2014-09-07 MED ORDER — ISOSORBIDE MONONITRATE ER 60 MG PO TB24: 60.0000 mg | ORAL_TABLET | Freq: Every day | ORAL | Status: DC

## 2014-09-07 NOTE — Telephone Encounter (Signed)
Patient seen in the ED by Dr. Elease Hashimoto over the weekend and advised to increase Imdur to 60 mg once daily.  Rx for Imdur 15 mg d/c'ed and new Rx for Imdur 60 mg sent.

## 2014-09-09 ENCOUNTER — Encounter: Payer: Self-pay | Admitting: Cardiovascular Disease

## 2014-09-09 ENCOUNTER — Telehealth: Payer: Self-pay | Admitting: Pulmonary Disease

## 2014-09-09 DIAGNOSIS — J849 Interstitial pulmonary disease, unspecified: Secondary | ICD-10-CM

## 2014-09-09 NOTE — Telephone Encounter (Signed)
Spoke with pt's son.  He states that pt is seeing Dr Excell Seltzer for chest pain on 10/02/14 and wants to know if Dr Kendrick Fries thinks it's necessary to have the HRCT before seeing Dr Excell Seltzer.  He also states that pt has chest pain mostly after waking up in the mornings and wants to know if this could be from his oxygen level dropping at night.  Please advise.

## 2014-09-09 NOTE — Progress Notes (Signed)
Pt scheduled to see Dr Excell Seltzer on 10/02/14.

## 2014-09-09 NOTE — Telephone Encounter (Signed)
Follow up    Pt's son returning this office call please give him a call back.

## 2014-09-09 NOTE — Telephone Encounter (Signed)
This encounter was created in error - please disregard.

## 2014-09-10 NOTE — Telephone Encounter (Signed)
Called and spoke to pt's son, [name]. Informed [name] of the recs per BQ. Order placed. [name] verbalized understanding.   PCC's please schedule HRCT before pt's appt with Dr. Excell Seltzer on 8.19.16. THanks.

## 2014-09-10 NOTE — Telephone Encounter (Signed)
I worry that he may have scarring in his lungs that makes his coronary artery disease worse (and thereby makes his chest pain worse).  Thus, I would like for him to have the HRCT prior to the cardiology visit.

## 2014-09-11 NOTE — Telephone Encounter (Signed)
Returned son's call & gave him appt info for CT sched 8/4 at 1:30 at [facility]  [employer]

## 2014-09-17 ENCOUNTER — Inpatient Hospital Stay: Admission: RE | Admit: 2014-09-17 | Payer: Medicare Other | Source: Ambulatory Visit

## 2014-09-22 ENCOUNTER — Ambulatory Visit (INDEPENDENT_AMBULATORY_CARE_PROVIDER_SITE_OTHER)
Admission: RE | Admit: 2014-09-22 | Discharge: 2014-09-22 | Disposition: A | Discharge status: Home / Self Care | Payer: Medicare Other | Source: Ambulatory Visit | Attending: Pulmonary Disease | Admitting: Pulmonary Disease

## 2014-09-22 DIAGNOSIS — J849 Interstitial pulmonary disease, unspecified: Secondary | ICD-10-CM | POA: Diagnosis not present

## 2014-09-22 DIAGNOSIS — R918 Other nonspecific abnormal finding of lung field: Secondary | ICD-10-CM | POA: Insufficient documentation

## 2014-09-22 DIAGNOSIS — R0600 Dyspnea, unspecified: Secondary | ICD-10-CM

## 2014-09-23 ENCOUNTER — Encounter: Payer: Self-pay | Admitting: Pulmonary Disease

## 2014-09-23 ENCOUNTER — Telehealth: Payer: Self-pay | Admitting: Pulmonary Disease

## 2014-09-23 ENCOUNTER — Ambulatory Visit (INDEPENDENT_AMBULATORY_CARE_PROVIDER_SITE_OTHER): Payer: Medicare Other | Admitting: Pulmonary Disease

## 2014-09-23 VITALS — BP 102/58 | HR 62 | Temp 97.1°F

## 2014-09-23 DIAGNOSIS — R918 Other nonspecific abnormal finding of lung field: Secondary | ICD-10-CM

## 2014-09-23 DIAGNOSIS — J449 Chronic obstructive pulmonary disease, unspecified: Secondary | ICD-10-CM | POA: Diagnosis not present

## 2014-09-23 DIAGNOSIS — I2510 Atherosclerotic heart disease of native coronary artery without angina pectoris: Secondary | ICD-10-CM | POA: Diagnosis not present

## 2014-09-23 DIAGNOSIS — R0602 Shortness of breath: Secondary | ICD-10-CM | POA: Diagnosis not present

## 2014-09-23 NOTE — Progress Notes (Signed)
Subjective:    Patient ID: Frank Marks, male    DOB: 1926-08-04, 81 y.o.   MRN: [id number]  Synopsis: Referred in 2016 for evaluation of dyspnea.  Has moderate COPD despite a brief smoking history, declined AAT testing.  Also has significant coronary artery disease. July 2016 pulmonary function testing ratio 63%, FEV1 2.12 L (70% predicted), total lung capacity 6.01 L (78% predicted), DLCO 13.99 (38% predicted) 09/2014 CT chest : Multiple "tiny" nodules, non-specific interstitial changes near pleura, mild emphysema, cardiac artery and valve calcification   HPI Chief Complaint  Patient presents with  . Follow-up    Pt c/o stable dyspnea.  review pft and CT chest high resolution.     Mr. Tomb says that his albuterol inhaler didn't help with his breathing. He is still feeling a lot of shortness of breath.  He hasn't been working outside. No coughing, no wheezing. He has fatigue. He has not been active much, not spending time [place]. He plans to go back to cardiology later this mont.  Past Medical History  Diagnosis Date  . Hypertension   . Hypercholesterolemia   . Stroke   . Chronic back pain   . Hx of cardiovascular stress test     Lex MV 3/14:  Not gated, apical thinning, no ischemia.   Marland Kitchen Hx of echocardiogram     Echo 04/24/12: mild LVH, EF 50-55%, mild AS (mean 9), MAC, mild MR, mod-severe BAE, mod TR, PASP 39  . Acute on chronic diastolic CHF (congestive heart failure), NYHA class 3 07/29/2014  . TIA (transient ischemic attack) 06/2007    "had 2-3 little ones around that time"  . Carotid artery stenosis   . Chronic atrial fibrillation   . Type II diabetes mellitus   . Osteoarthritis   . Arthritis     "back" (07/29/2014)  . Chronic lower back pain   . Exertional shortness of breath     chronic/notes 07/29/2014  . Pneumonia ~ 1951; 02/2014      Review of Systems  Constitutional: Positive for fatigue. Negative for fever and chills.  HENT: Negative for postnasal  drip, rhinorrhea and sinus pressure.   Respiratory: Negative for cough, shortness of breath and wheezing.   Cardiovascular: Negative for chest pain, palpitations and leg swelling.       Objective:   Physical Exam Filed Vitals:   09/23/14 1149  BP: 102/58  Pulse: 62  Temp: 97.1 F (36.2 C)  TempSrc: Oral  SpO2: 99%    RA  Gen: chronically ill appearing but no distress HENT: OP clear, TM's clear, neck supple PULM: CTA B, normal percussion CV: RRR, no mgr, trace edema GI: BS+, soft, nontender Derm: no cyanosis or rash Psyche: normal mood and affect  Images from CT chest personally reviewed, see below     Assessment & Plan:  Pulmonary nodules I have personally reviewed the images of his CT chest which showed mild to moderate emphysema as well as a very small pulmonary nodule in the left upper lobe. I have advised him that we need to do a repeat CT chest in one year in order to evaluate this further to make sure there is no growth. He has declined repeat imaging. We will address this again in 6 months.  COPD, moderate He has moderate COPD based on his lung function testing, and on my personal review of his CT chest did have mild emphysema bilaterally. There is no evidence of an interstitial lung disease. He  does not wheeze on exam nor did he get benefit from albuterol. While I believe that his COPD contributes to his dyspnea, I do not think this is the primary problem. I think that deconditioning and likely his cardiac disease also contribute significantly to his shortness of breath.  Plan: Add Spiriva (sample provided) to see if this helps of dyspnea Follow-up with cardiology to discuss options for treating cardiac disease. Follow-up 6 months    Current outpatient prescriptions:  .  acetaminophen (TYLENOL) 325 MG tablet, Take 2 tablets (650 mg total) by mouth every 4 (four) hours as needed for headache or mild pain., Disp: , Rfl:  .  Albuterol Sulfate (PROAIR RESPICLICK)  108 (90 BASE) MCG/ACT AEPB, Inhale 1 puff into the lungs every 6 (six) hours as needed (shortness of breath)., Disp: 1 each, Rfl: 0 .  aspirin EC 81 MG tablet, Take 1 tablet (81 mg total) by mouth daily., Disp: 30 tablet, Rfl: 12 .  BD PEN NEEDLE NANO U/F 32G X 4 MM MISC, USE WITH INSULIN ADMINISTRATION DAILY, Disp: , Rfl: 5 .  CIPRO HC otic suspension, Place 3 drops in ear(s) 2 (two) times daily. In right ear, Disp: , Rfl: 0 .  Cyanocobalamin (VITAMIN B 12 PO), Take 2,500 mg by mouth daily. , Disp: , Rfl:  .  dabigatran (PRADAXA) 150 MG CAPS capsule, TAKE ONE CAPSULE BY MOUTH EVERY 12 HOURS, Disp: 60 capsule, Rfl: 6 .  diltiazem (CARDIZEM CD) 240 MG 24 hr capsule, Take 1 capsule (240 mg total) by mouth daily., Disp: 30 capsule, Rfl: 6 .  feeding supplement, ENSURE ENLIVE, (ENSURE ENLIVE) LIQD, Take 237 mLs by mouth 2 (two) times daily between meals., Disp: 237 mL, Rfl: 12 .  glimepiride (AMARYL) 4 MG tablet, Take 4 mg by mouth 2 (two) times daily., Disp: , Rfl:  .  isosorbide mononitrate (IMDUR) 60 MG 24 hr tablet, Take 1 tablet (60 mg total) by mouth daily., Disp: 31 tablet, Rfl: 11 .  LANTUS SOLOSTAR 100 UNIT/ML Solostar Pen, USE 20 UNITS AT BEDTIME, Disp: , Rfl: 3 .  lisinopril (PRINIVIL,ZESTRIL) 20 MG tablet, Take 20 mg by mouth daily., Disp: , Rfl:  .  metFORMIN (GLUCOPHAGE-XR) 500 MG 24 hr tablet, Take 2 tablets (1,000 mg total) by mouth 2 (two) times daily., Disp: , Rfl:  .  metoprolol tartrate (LOPRESSOR) 25 MG tablet, Take 1 tablet (25 mg total) by mouth 2 (two) times daily., Disp: 60 tablet, Rfl: 11 .  nitroGLYCERIN (NITROSTAT) 0.4 MG SL tablet, Place 1 tablet (0.4 mg total) under the tongue every 5 (five) minutes as needed for chest pain., Disp: 100 tablet, Rfl: 12 .  pravastatin (PRAVACHOL) 80 MG tablet, Take 80 mg by mouth daily., Disp: , Rfl:  .  TRUETRACK TEST test strip, 1 each by Other route 2 (two) times daily. , Disp: , Rfl: 4

## 2014-09-23 NOTE — Patient Instructions (Signed)
Call back so that week and give you a sample of Spiriva Stay active Follow-up with cardiology Follow-up with [place] in 6 months or sooner if needed

## 2014-09-23 NOTE — Telephone Encounter (Signed)
Spoke with pt's finace, Lonna. States that they received the results from his CT this morning at his OV. Verified the results with her and made sure she didn't have any questions. Nothing further was needed.

## 2014-09-23 NOTE — Assessment & Plan Note (Signed)
I have personally reviewed the images of his CT chest which showed mild to moderate emphysema as well as a very small pulmonary nodule in the left upper lobe. I have advised him that we need to do a repeat CT chest in one year in order to evaluate this further to make sure there is no growth. He has declined repeat imaging. We will address this again in 6 months.

## 2014-09-23 NOTE — Assessment & Plan Note (Signed)
He has moderate COPD based on his lung function testing, and on my personal review of his CT chest did have mild emphysema bilaterally. There is no evidence of an interstitial lung disease. He does not wheeze on exam nor did he get benefit from albuterol. While I believe that his COPD contributes to his dyspnea, I do not think this is the primary problem. I think that deconditioning and likely his cardiac disease also contribute significantly to his shortness of breath.  Plan: Add Spiriva (sample provided) to see if this helps of dyspnea Follow-up with cardiology to discuss options for treating cardiac disease. Follow-up 6 months

## 2014-09-24 ENCOUNTER — Other Ambulatory Visit: Payer: Medicare Other

## 2014-09-25 ENCOUNTER — Telehealth: Payer: Self-pay | Admitting: Cardiovascular Disease

## 2014-09-25 DIAGNOSIS — I2510 Atherosclerotic heart disease of native coronary artery without angina pectoris: Secondary | ICD-10-CM

## 2014-09-25 NOTE — Telephone Encounter (Signed)
Informed that Dr. Excell Seltzer and his nurse, Leotis Shames, were out of the office this week. She is aware that someone will call her next week in regards to this.

## 2014-09-25 NOTE — Telephone Encounter (Signed)
New message      Pt has decided he want to see Dr Laneta Simmers.  Please call Dr Sharee Pimple office and refer pt and set up an appt

## 2014-09-27 NOTE — Telephone Encounter (Signed)
Please refer to Dr Laneta Simmers for CABG evaluation at patient's request. See my notes from recent hospitalization. thx

## 2014-09-28 NOTE — Telephone Encounter (Signed)
F/u        Pt's fiance calling stating that pt has changed his mind and no longer wants to be referred to Dr. Laneta Simmers. Pt would like to have a consult with a heart surgeon that Dr. Excell Seltzer recommends. Please call back and advise.

## 2014-09-28 NOTE — Telephone Encounter (Signed)
Dr. Excell Seltzer- is there anybody else besides Dr. Laneta Simmers you would prefer him to see- It looks like the patient initially requested Dr. Laneta Simmers and you agreed, but see below.

## 2014-09-30 ENCOUNTER — Encounter: Payer: Self-pay | Admitting: Cardiovascular Disease

## 2014-09-30 NOTE — Telephone Encounter (Signed)
New message     Pt has question (would not give any other information) Please call to discuss

## 2014-09-30 NOTE — Telephone Encounter (Signed)
This encounter was created in error - please disregard.

## 2014-09-30 NOTE — Telephone Encounter (Signed)
I spoke with the pt and at this time he would like to proceed with referral to Dr Laneta Simmers to discuss CABG.  The pt said he was contacted yesterday by a doctor's office and given an appointment for this Friday at 4:00. I made the pt aware that the only appointment currently in the [facility] system is this Friday at 2:00 with Dr Excell Seltzer.  I advised the pt that I will place referral to Dr Laneta Simmers and [facility] will contact him with appointment.

## 2014-10-02 ENCOUNTER — Ambulatory Visit (INDEPENDENT_AMBULATORY_CARE_PROVIDER_SITE_OTHER): Payer: Medicare Other | Admitting: Cardiovascular Disease

## 2014-10-02 ENCOUNTER — Encounter: Payer: Self-pay | Admitting: Cardiovascular Disease

## 2014-10-02 VITALS — BP 138/78 | HR 52 | Ht 73.0 in | Wt 211.8 lb

## 2014-10-02 DIAGNOSIS — R079 Chest pain, unspecified: Secondary | ICD-10-CM

## 2014-10-02 DIAGNOSIS — I2510 Atherosclerotic heart disease of native coronary artery without angina pectoris: Secondary | ICD-10-CM | POA: Diagnosis not present

## 2014-10-02 DIAGNOSIS — I10 Essential (primary) hypertension: Secondary | ICD-10-CM | POA: Diagnosis not present

## 2014-10-02 MED ORDER — RANOLAZINE ER 500 MG PO TB12: 500.0000 mg | ORAL_TABLET | Freq: Two times a day (BID) | ORAL | Status: DC

## 2014-10-02 NOTE — Patient Instructions (Signed)
Medication Instructions:  Your physician has recommended you make the following change in your medication:  1. START Ranexa  take one tablet by mouth twice a day  Labwork: No new orders.   Testing/Procedures: No new orders.   Follow-Up: You have been referred to Dr Laneta Simmers at [facility]  Your physician recommends that you schedule a follow-up appointment in: 6-8 WEEKS with Dr Excell Seltzer Leotis Shames will contact you with an appointment)   Any Other Special Instructions Will Be Listed Below (If Applicable).

## 2014-10-02 NOTE — Progress Notes (Signed)
Cardiology Office Note Date:  10/04/2014   ID:  Frank Marks, DOB 1926-08-02, MRN [id number]  PCP:  Ailene Ravel, MD  Cardiologist:  [name], MD    No chief complaint on file.   History of Present Illness: Frank Marks is a 81 y.o. male who presents for follow-up of CAD, chronic atrial fibrillation, and chronic systolic heart failure. The patient was last evaluated in June 2016, when he presented with progressive shortness of breath, exercise intolerance, and substernal chest discomfort. He underwent cardiac catheterization demonstrating severe diffuse proximal and mid LAD stenosis, total occlusion of the right coronary artery, and severe stenosis of the obtuse marginal branch of the left circumflex. He has been noted to have mild LV dysfunction. We reviewed treatment options extensively at that time with both the patient and his family. I was concerned about performing multivessel PCI because of both anatomic characteristics of his coronary disease with long segment stenosis of the LAD extending back to the ostium, total occlusion of the RCA, and multivessel coronary disease in the context of diabetes and left ventricular dysfunction. Because of his advanced age, we decided not to pursue cardiac surgery as an initial approach. The patient has continued to have significant limitation with shortness of breath and weakness. He takes nitroglycerin as needed and has had to use this periodically, but not over the last 4-5 days. He is not doing much physically because of his cardiopulmonary limitation. He has been evaluated by Dr. Kendrick Fries and his symptoms are felt to be out of proportion to the extent of pulmonary disease.  The patient is here for follow-up today with his family present. He reports no significant change in symptoms. He is interested in pursuing cardiac surgical referral with hopes of improving his quality of life. He continues to have shortness of breath with low-level  activity. He denies orthopnea, PND, or leg swelling. He does have central chest pressure on an episodic basis, not consistently related to physical exertion. His symptoms have been responsive to nitroglycerin.   Past Medical History  Diagnosis Date  . Hypertension   . Hypercholesterolemia   . Stroke   . Chronic back pain   . Hx of cardiovascular stress test     Lex MV 3/14:  Not gated, apical thinning, no ischemia.   Marland Kitchen Hx of echocardiogram     Echo 04/24/12: mild LVH, EF 50-55%, mild AS (mean 9), MAC, mild MR, mod-severe BAE, mod TR, PASP 39  . Acute on chronic diastolic CHF (congestive heart failure), NYHA class 3 07/29/2014  . TIA (transient ischemic attack) 06/2007    "had 2-3 little ones around that time"  . Carotid artery stenosis   . Chronic atrial fibrillation   . Type II diabetes mellitus   . Osteoarthritis   . Arthritis     "back" (07/29/2014)  . Chronic lower back pain   . Exertional shortness of breath     chronic/notes 07/29/2014  . Pneumonia ~ 1951; 02/2014    Past Surgical History  Procedure Laterality Date  . Lumbar laminectomy/decompression microdiscectomy  1990's X 1; 2000's X1  . Knee arthroscopy Left   . Back surgery    . Tonsillectomy    . Appendectomy    . Replacement total knee Bilateral ~ 2001-2002  . Total hip arthroplasty Left ~ 2003  . Joint replacement    . Cataract extraction w/ intraocular lens  implant, bilateral Bilateral   . Cardiac catheterization  07/29/2014  . Cardiac catheterization N/A  07/29/2014    Procedure: Left Heart Cath and Coronary Angiography;  Surgeon: [name], MD;  Location: [facility] INVASIVE CV LAB;  Service: Cardiovascular;  Laterality: N/A;    Current Outpatient Prescriptions  Medication Sig Dispense Refill  . acetaminophen (TYLENOL) 325 MG tablet Take 2 tablets (650 mg total) by mouth every 4 (four) hours as needed for headache or mild pain.    . Albuterol Sulfate (PROAIR RESPICLICK) 108 (90 BASE) MCG/ACT AEPB Inhale 1 puff  into the lungs every 6 (six) hours as needed (shortness of breath). 1 each 0  . aspirin EC 81 MG tablet Take 1 tablet (81 mg total) by mouth daily. 30 tablet 12  . BD PEN NEEDLE NANO U/F 32G X 4 MM MISC USE WITH INSULIN ADMINISTRATION DAILY  5  . CIPRO HC otic suspension Place 3 drops in ear(s) 2 (two) times daily. In right ear  0  . Cyanocobalamin (VITAMIN B 12 PO) Take 2,500 mg by mouth daily.     . dabigatran (PRADAXA) 150 MG CAPS capsule TAKE ONE CAPSULE BY MOUTH EVERY 12 HOURS 60 capsule 6  . diltiazem (CARDIZEM CD) 240 MG 24 hr capsule Take 1 capsule (240 mg total) by mouth daily. 30 capsule 6  . feeding supplement, ENSURE ENLIVE, (ENSURE ENLIVE) LIQD Take 237 mLs by mouth 2 (two) times daily between meals. 237 mL 12  . glimepiride (AMARYL) 4 MG tablet Take 4 mg by mouth 2 (two) times daily.    . isosorbide mononitrate (IMDUR) 60 MG 24 hr tablet Take 1 tablet (60 mg total) by mouth daily. 31 tablet 11  . LANTUS SOLOSTAR 100 UNIT/ML Solostar Pen USE 20 UNITS AT BEDTIME  3  . lisinopril (PRINIVIL,ZESTRIL) 20 MG tablet Take 20 mg by mouth daily.    . metFORMIN (GLUCOPHAGE-XR) 500 MG 24 hr tablet Take 2 tablets (1,000 mg total) by mouth 2 (two) times daily.    . metoprolol tartrate (LOPRESSOR) 25 MG tablet Take 1 tablet (25 mg total) by mouth 2 (two) times daily. 60 tablet 11  . nitroGLYCERIN (NITROSTAT) 0.4 MG SL tablet Place 1 tablet (0.4 mg total) under the tongue every 5 (five) minutes as needed for chest pain. 100 tablet 12  . pravastatin (PRAVACHOL) 80 MG tablet Take 80 mg by mouth daily.    Gilman Schmidt TEST test strip 1 each by Other route 2 (two) times daily.   4  . ranolazine (RANEXA) 500 MG 12 hr tablet Take 1 tablet (500 mg total) by mouth 2 (two) times daily. 60 tablet 6   No current facility-administered medications for this visit.    Allergies:   Review of patient's allergies indicates no known allergies.   Social History:  The patient  reports that he quit smoking about 30  years ago. His smoking use included Cigarettes. He has a 5 pack-year smoking history. He has quit using smokeless tobacco. His smokeless tobacco use included Chew. He reports that he does not drink alcohol or use illicit drugs.   Family History:  The patient's family history includes Alzheimer's disease in his mother; Arthritis in his mother; Healthy in his mother; Other in an other family member; Stroke in his father.    ROS:  Please see the history of present illness.  Otherwise, review of systems is positive for chest pain, shortness of breath, hearing loss, back pain.  All other systems are reviewed and negative.    PHYSICAL EXAM: VS:  BP 138/78 mmHg  Pulse 52  Ht 6'  1" (1.854 m)  Wt 211 lb 12.8 oz (96.072 kg)  BMI 27.95 kg/m2 , BMI Body mass index is 27.95 kg/(m^2). GEN: Well nourished, well developed, pleasant elderly male moderately hard of hearing in no acute distress HEENT: normal Neck: no JVD, no masses. No carotid bruits Cardiac: Irregularly irregular with 2/6 early peaking systolic murmur at the RUSB           Respiratory:  clear to auscultation bilaterally, normal work of breathing GI: soft, nontender, nondistended, + BS MS: no deformity or atrophy Ext: no pretibial edema, pedal pulses 2+= bilaterally Skin: warm and dry, no rash Neuro:  Strength and sensation are intact Psych: euthymic mood, full affect  EKG:  EKG is ordered today. The ekg ordered today shows atrial fibrillation with slow ventricular response 52 bpm, nonspecific intraventricular conduction delay, left axis deviation, age-indeterminate septal infarction  Recent Labs: 02/12/2014: B Natriuretic Peptide 272.0* 02/13/2014: ALT 16 07/23/2014: Pro B Natriuretic peptide (BNP) 411.0* 09/04/2014: BUN 26*; Creatinine, Ser 1.26*; Hemoglobin 12.6*; Platelets 189; Potassium 4.4; Sodium 138   Lipid Panel     Component Value Date/Time   CHOL * 06/16/2007 0605    248        ATP III CLASSIFICATION:  <200     mg/dL    Desirable  213-086  mg/dL   Borderline High  >=578    mg/dL   High   TRIG 469* 62/95/2841 0605   HDL 30* 06/16/2007 0605   CHOLHDL 8.3 06/16/2007 0605   VLDL 66* 06/16/2007 0605   LDLCALC * 06/16/2007 0605    152        Total Cholesterol/HDL:CHD Risk Coronary Heart Disease Risk Table                     Men   Women  1/2 Average Risk   3.4   3.3      Wt Readings from Last 3 Encounters:  10/02/14 211 lb 12.8 oz (96.072 kg)  09/04/14 208 lb (94.348 kg)  08/21/14 209 lb (94.802 kg)     Cardiac Studies Reviewed: 2D Echo 04/08/2014: Study Conclusions  - Left ventricle: Wall thickness was increased in a pattern of moderate LVH. Systolic function was mildly reduced. The estimated ejection fraction was in the range of 45% to 50%. Wall motion was normal; there were no regional wall motion abnormalities. - Aortic valve: There was mild stenosis. - Mitral valve: Calcified annulus. Mildly thickened leaflets . There was mild regurgitation. - Left atrium: The atrium was mildly dilated. - Right atrium: The atrium was mildly dilated. - Pulmonary arteries: Systolic pressure was moderately increased. PA peak pressure: 50 mm Hg (S).  Cardiac Cath 07/29/2014: Technique and Indications    INDICATION: 81 year old gentleman with chronic atrial fibrillation and diabetes. He has experienced progressive shortness of breath and recent onset of angina. He has also developed mild LV systolic dysfunction. After careful review of risks, indications, and potential benefit to cardiac catheterization and possible PCI, the patient and his family decided to proceed.  PROCEDURAL DETAILS: The right wrist was prepped, draped, and anesthetized with 1% lidocaine. Using the modified Seldinger technique, a 5/6 French Slender sheath was introduced into the right radial artery. 3 mg of verapamil was administered through the sheath, weight-based unfractionated heparin was administered intravenously. Standard  Judkins catheters were used for selective coronary angiography and left ventriculography. Catheter exchanges were performed over an exchange length guidewire. There were no immediate procedural complications. A TR band was used for  radial hemostasis at the completion of the procedure. The patient was transferred to the post catheterization recovery area for further monitoring.There were no immediate complications during the procedure.    Conclusion    1. Mid RCA lesion, 95% stenosed. 2. Prox RCA lesion, 60% stenosed.  FINAL CONCLUSIONS:  SEVERE MULTIVESSEL CAD WITH DIFFUSE PROXIMAL LAD STENOSIS, SEVERE OM1 STENOSIS, AND TOTAL OCCLUSION OF THE RCA WITH LEFT-TO-RIGHT COLLATERALS  MILD SEGMENTAL LV SYSTOLIC DYSFUNCTION WITH NORMAL LVEDP  RECOMMENDATIONS:  DIFFICULT SITUATION IN THIS ELDERLY GENTLEMAN WITH PROGRESSIVE DECLINE IN FUNCTIONAL CAPACITY, PRIMARILY RELATED TO WORSENING SHORTNESS OF BREATH. IN MY OPINION HIS CORONARY DISEASE WOULD CLEARLY BE BEST TREATED WITH CABG (MULTIVESSEL DIFFUSE DISEASE, DIABETES, LV SYSTOLIC DYSFUNCTION). HOWEVER, I AM CONCERNED ABOUT HIS RISK OF CABG AT AGE 63. PCI +/- MEDICAL THERAPY IS ANOTHER OPTION BUT BECAUSE OF RCA TOTAL OCCLUSION, DIFFUSE LAD STENOSIS EXTENDING BACK TO THE OSTIUM, NEED FOR CHRONIC ANTICOAGULATION, THIS APPROACH ALSO HAS SIGNIFICANT LIMITATIONS.   WILL ADMIT, CYCLE ENZYMES AS THE PATIENT HAD RESTING CHEST PAIN TODAY, AND REVIEW FILMS/CLINICAL SCENARIO WITH COLLEAGUES  DISCUSSED AT LENGTH WITH PATIENT AND FAMILY     Coronary Findings    Dominance: Co-dominant   Left Anterior Descending   . Prox LAD lesion, 80% stenosed. calcified diffuse .   Marland Kitchen Mid LAD lesion, 80% stenosed. calcified diffuse .     Left Circumflex   . Dist Cx lesion, 40% stenosed. diffuse .   Marland Kitchen First Obtuse Marginal Branch   . 1st Mrg lesion, 90% stenosed. calcified .   Marland Kitchen Third Biomedical engineer   . 3rd Mrg lesion, 70% stenosed. Ostial lesion      Right Coronary Artery  Dist RCA filled by collaterals from Dist Cx.   . Prox RCA lesion, 60% stenosed. calcified diffuse .   Marland Kitchen Mid RCA lesion, 95% stenosed.   . Dist RCA lesion, 100% stenosed.      Wall Motion                 Left Heart    Left Ventricle There is mild left ventricular systolic dysfunction. The left ventricular ejection fraction is 45-50% by visual estimate. There are wall motion abnormalities in the left ventricle. There are segmental wall motion abnormalities in the left ventricle.    Coronary Diagrams    Diagnostic Diagram              ASSESSMENT AND PLAN: 1.  Coronary artery disease, native vessel, with CCS class III angina: We will add Ranexa 500 mg to his medical program. The patient has progressive decline which uncertain is at least partly related to his coronary artery disease. He would like to consider revascularization options. I am going to refer him to Dr. Laneta Simmers for consideration of CABG. I do think he is at significant risk of conventional heart surgery because of his advanced age and comorbid conditions. If he is deemed to be a nonoperative candidate, would consider rotational atherectomy and stenting of his LAD as well as PCI of the left circumflex.  2. Chronic systolic heart failure: Probably a combination of LV dysfunction both from atrial fibrillation and ischemic heart disease. Will continue his current medical program which includes an ACE inhibitor and beta blocker.  3. Chronic atrial fibrillation. The patient is maintained on anticoagulation with Dabigatran. Heart rate is better controlled than in past.  4. Diabetes: per PCP.  5. Aortic stenosis: mild by recent evaluation.    Current medicines are reviewed with the patient  today.  The patient does not have concerns regarding medicines.  Labs/ tests ordered today include:   Orders Placed This Encounter  Procedures  . EKG 12-Lead    Disposition:   FU pending TCTS evaluation  for CABG  Signed, [name], MD  10/04/2014 12:32 PM    [facility] [address] [place], [place], [place]  [address] Phone: [phone]; Fax: [phone]

## 2014-10-04 ENCOUNTER — Encounter: Payer: Self-pay | Admitting: Cardiovascular Disease

## 2014-10-14 ENCOUNTER — Institutional Professional Consult (permissible substitution) (INDEPENDENT_AMBULATORY_CARE_PROVIDER_SITE_OTHER): Payer: Medicare Other | Admitting: Surgery

## 2014-10-14 ENCOUNTER — Encounter: Payer: Self-pay | Admitting: Surgery

## 2014-10-14 VITALS — BP 146/81 | HR 68 | Resp 16 | Ht 72.0 in | Wt 207.0 lb

## 2014-10-14 DIAGNOSIS — I5022 Chronic systolic (congestive) heart failure: Secondary | ICD-10-CM

## 2014-10-14 DIAGNOSIS — I2510 Atherosclerotic heart disease of native coronary artery without angina pectoris: Secondary | ICD-10-CM

## 2014-10-14 DIAGNOSIS — I482 Chronic atrial fibrillation, unspecified: Secondary | ICD-10-CM

## 2014-10-15 ENCOUNTER — Other Ambulatory Visit: Payer: Self-pay | Admitting: *Deleted

## 2014-10-15 DIAGNOSIS — I350 Nonrheumatic aortic (valve) stenosis: Secondary | ICD-10-CM

## 2014-10-16 ENCOUNTER — Ambulatory Visit (HOSPITAL_COMMUNITY)
Admission: RE | Admit: 2014-10-16 | Discharge: 2014-10-16 | Disposition: A | Discharge status: Home / Self Care | Payer: Medicare Other | Source: Ambulatory Visit | Attending: Surgery | Admitting: Surgery

## 2014-10-16 DIAGNOSIS — I4891 Unspecified atrial fibrillation: Secondary | ICD-10-CM | POA: Insufficient documentation

## 2014-10-16 DIAGNOSIS — I10 Essential (primary) hypertension: Secondary | ICD-10-CM | POA: Insufficient documentation

## 2014-10-16 DIAGNOSIS — J449 Chronic obstructive pulmonary disease, unspecified: Secondary | ICD-10-CM | POA: Diagnosis not present

## 2014-10-16 DIAGNOSIS — I77810 Thoracic aortic ectasia: Secondary | ICD-10-CM | POA: Diagnosis not present

## 2014-10-16 DIAGNOSIS — I350 Nonrheumatic aortic (valve) stenosis: Secondary | ICD-10-CM | POA: Diagnosis present

## 2014-10-16 DIAGNOSIS — Z87891 Personal history of nicotine dependence: Secondary | ICD-10-CM | POA: Insufficient documentation

## 2014-10-16 DIAGNOSIS — E119 Type 2 diabetes mellitus without complications: Secondary | ICD-10-CM | POA: Diagnosis not present

## 2014-10-16 DIAGNOSIS — I2510 Atherosclerotic heart disease of native coronary artery without angina pectoris: Secondary | ICD-10-CM | POA: Insufficient documentation

## 2014-10-16 NOTE — Progress Notes (Signed)
*  PRELIMINARY RESULTS* Echocardiogram 2D Echocardiogram has been performed.  Jeryl Columbia 10/16/2014, 2:01 PM

## 2014-10-17 ENCOUNTER — Encounter: Payer: Self-pay | Admitting: Surgery

## 2014-10-17 NOTE — Progress Notes (Signed)
Cardiothoracic Surgery History and Physical   PCP is Ailene Ravel, MD Referring Provider is [name], MD  Chief Complaint  Patient presents with  . Coronary Artery Disease    eval for CABG....ECHO 04/08/14, CT CHEST 09/22/14, CATH 07/29/14, PFT 08/25/14  . Shortness of Breath  . Fatigue    HPI:  The patient is an 81 year old gentleman with a history of hypertension, hypercholesterolemia, acute on chronic diastolic heart failure, chronic atrial fibrillation, and chronic diastolic heart failure who presented in June 2016 with progressive shortness of breath, exertional fatigue, and substernal chest discomfort. He underwent cardiac catheterization demonstrating severe diffuse proximal and mid LAD stenosis, total occlusion of the right coronary artery, and severe stenosis of the obtuse marginal branch of the left circumflex. He had mild LV dysfunction with an EF of 45-50%. His last echo in 03/2014 showed mild aortic stenosis with a mean gradient of 17 mm Hg with mild MR. A decision was made to continue medical treatment. He has continued to be very limited in his activity due to shortness of breath, chest pain and weakness. He has been taking NTG every day recently and sometimes 3 per day. He was seen by Dr. Kendrick Fries and had PFT's which showed mild obstruction and mild restriction with a severe diffusion defect of 38% of predicted. He felt that his shortness of breath was out of proportion to his pulmonary function.  Past Medical History  Diagnosis Date  . Hypertension   . Hypercholesterolemia   . Stroke   . Chronic back pain   . Hx of cardiovascular stress test     Lex MV 3/14:  Not gated, apical thinning, no ischemia.   Marland Kitchen Hx of echocardiogram     Echo 04/24/12: mild LVH, EF 50-55%, mild AS (mean 9), MAC, mild MR, mod-severe BAE, mod TR, PASP 39  . Acute on chronic diastolic CHF (congestive heart failure), NYHA class 3 07/29/2014  . TIA (transient ischemic attack) 06/2007    "had  2-3 little ones around that time"  . Carotid artery stenosis   . Chronic atrial fibrillation   . Type II diabetes mellitus   . Osteoarthritis   . Arthritis     "back" (07/29/2014)  . Chronic lower back pain   . Exertional shortness of breath     chronic/notes 07/29/2014  . Pneumonia ~ 1951; 02/2014    Past Surgical History  Procedure Laterality Date  . Lumbar laminectomy/decompression microdiscectomy  1990's X 1; 2000's X1  . Knee arthroscopy Left   . Back surgery    . Tonsillectomy    . Appendectomy    . Replacement total knee Bilateral ~ 2001-2002  . Total hip arthroplasty Left ~ 2003  . Joint replacement    . Cataract extraction w/ intraocular lens  implant, bilateral Bilateral   . Cardiac catheterization  07/29/2014  . Cardiac catheterization N/A 07/29/2014    Procedure: Left Heart Cath and Coronary Angiography;  Surgeon: [name], MD;  Location: [facility] INVASIVE CV LAB;  Service: Cardiovascular;  Laterality: N/A;    Family History  Problem Relation Age of Onset  . Other      no family hx of coronary artery disease  . Stroke Father   . Healthy Mother   . Arthritis Mother   . Alzheimer's disease Mother     Social History Social History  Substance Use Topics  . Smoking status: Former Smoker -- 1.00 packs/day for 5 years    Types: Cigarettes  Quit date: 08/20/1984  . Smokeless tobacco: Former Neurosurgeon    Types: Chew     Comment: "stopped smoking cigarettes in the 1970's; quit chewing ~ 2000"  . Alcohol Use: No    Current Outpatient Prescriptions  Medication Sig Dispense Refill  . acetaminophen (TYLENOL) 325 MG tablet Take 2 tablets (650 mg total) by mouth every 4 (four) hours as needed for headache or mild pain.    . Albuterol Sulfate (PROAIR RESPICLICK) 108 (90 BASE) MCG/ACT AEPB Inhale 1 puff into the lungs every 6 (six) hours as needed (shortness of breath). 1 each 0  . aspirin EC 81 MG tablet Take 1 tablet (81 mg total) by mouth daily. 30 tablet 12  . BD PEN  NEEDLE NANO U/F 32G X 4 MM MISC USE WITH INSULIN ADMINISTRATION DAILY  5  . CIPRO HC otic suspension Place 3 drops in ear(s) 2 (two) times daily. In right ear  0  . Cyanocobalamin (VITAMIN B 12 PO) Take 2,500 mg by mouth daily.     . dabigatran (PRADAXA) 150 MG CAPS capsule TAKE ONE CAPSULE BY MOUTH EVERY 12 HOURS 60 capsule 6  . diltiazem (CARDIZEM CD) 240 MG 24 hr capsule Take 1 capsule (240 mg total) by mouth daily. 30 capsule 6  . feeding supplement, ENSURE ENLIVE, (ENSURE ENLIVE) LIQD Take 237 mLs by mouth 2 (two) times daily between meals. 237 mL 12  . glimepiride (AMARYL) 4 MG tablet Take 4 mg by mouth 2 (two) times daily.    . isosorbide mononitrate (IMDUR) 60 MG 24 hr tablet Take 1 tablet (60 mg total) by mouth daily. 31 tablet 11  . LANTUS SOLOSTAR 100 UNIT/ML Solostar Pen USE 20 UNITS AT BEDTIME  3  . lisinopril (PRINIVIL,ZESTRIL) 20 MG tablet Take 20 mg by mouth daily.    . metFORMIN (GLUCOPHAGE-XR) 500 MG 24 hr tablet Take 2 tablets (1,000 mg total) by mouth 2 (two) times daily.    . metoprolol tartrate (LOPRESSOR) 25 MG tablet Take 1 tablet (25 mg total) by mouth 2 (two) times daily. 60 tablet 11  . nitroGLYCERIN (NITROSTAT) 0.4 MG SL tablet Place 1 tablet (0.4 mg total) under the tongue every 5 (five) minutes as needed for chest pain. 100 tablet 12  . pravastatin (PRAVACHOL) 80 MG tablet Take 80 mg by mouth daily.    . ranolazine (RANEXA) 500 MG 12 hr tablet Take 1 tablet (500 mg total) by mouth 2 (two) times daily. 60 tablet 6  . TRUETRACK TEST test strip 1 each by Other route 2 (two) times daily.   4   No current facility-administered medications for this visit.    No Known Allergies  Review of Systems  Constitutional: Positive for activity change and fatigue.  HENT: Positive for hearing loss.        Partial upper denture. Last saw his dentist 2-3 years ago.  Eyes: Negative.   Respiratory: Positive for shortness of breath.   Cardiovascular: Positive for chest pain.  Negative for leg swelling.       Chest pain with minimal exertion and at rest  Gastrointestinal: Negative.   Endocrine: Negative.   Genitourinary: Negative.   Musculoskeletal: Negative.   Skin: Negative.   Allergic/Immunologic: Negative.   Neurological: Negative.   Hematological: Negative.   Psychiatric/Behavioral: Negative.     BP 146/81 mmHg  Pulse 68  Resp 16  Ht 6' (1.829 m)  Wt 207 lb (93.895 kg)  BMI 28.07 kg/m2  SpO2 97% Physical Exam  Constitutional:  He is oriented to person, place, and time.  Elderly white male in no distress.  HENT:  Head: Normocephalic and atraumatic.  Mouth/Throat: Oropharynx is clear and moist.  Eyes: EOM are normal. Pupils are equal, round, and reactive to light.  Neck: Normal range of motion. Neck supple. No JVD present. No tracheal deviation present. No thyromegaly present.  Cardiovascular: Normal rate and regular rhythm.   Murmur heard. 2/6 systolic murmur along RSB  Pulmonary/Chest: Effort normal and breath sounds normal. No respiratory distress. He has no wheezes. He has no rales.  Abdominal: Soft. Bowel sounds are normal. He exhibits no distension and no mass. There is no tenderness.  Musculoskeletal: Normal range of motion. He exhibits no edema or tenderness.  Lymphadenopathy:    He has no cervical adenopathy.  Neurological: He is alert and oriented to person, place, and time. He has normal strength. No cranial nerve deficit or sensory deficit.  Skin: Skin is warm and dry.  Psychiatric: He has a normal mood and affect.     Diagnostic Tests:    [facility]            [address]            [place], [place] [address]              [phone]  ------------------------------------------------------------------- Echocardiography  (Report amended )  Patient:  Prithvi, Kooi MR #:    [id number] Study Date: 04/08/2014 Gender:   M Age:    72 Height:   182.9  cm Weight:   94.8 kg BSA:    2.21 m^2 Pt. Status: Room:  SONOGRAPHER Randa Evens, Will ORDERING   [name] REFERRING  [name] ATTENDING  Thurmon Fair, MD PERFORMING  [facility], Outpatient  cc:  ------------------------------------------------------------------- LV EF: 45% -  50%  ------------------------------------------------------------------- History:  PMH: Acquired from the patient and from the patient&'s chart. Dyspnea. Atrial fibrillation. Aortic stenosis. Transient ischemic attack. Risk factors: Hypertension. Diabetes mellitus. Dyslipidemia.  ------------------------------------------------------------------- Study Conclusions  - Left ventricle: Wall thickness was increased in a pattern of moderate LVH. Systolic function was mildly reduced. The estimated ejection fraction was in the range of 45% to 50%. Wall motion was normal; there were no regional wall motion abnormalities. - Aortic valve: There was mild stenosis. - Mitral valve: Calcified annulus. Mildly thickened leaflets . There was mild regurgitation. - Left atrium: The atrium was mildly dilated. - Right atrium: The atrium was mildly dilated. - Pulmonary arteries: Systolic pressure was moderately increased. PA peak pressure: 50 mm Hg (S).  Echocardiography. M-mode, complete 2D, spectral Doppler, and color Doppler. Birthdate: Patient birthdate: August 08, 1926. Age: Patient is 81 yr old. Sex: Gender: male.  BMI: 28.3 kg/m^2. Blood pressure:   122/86 Patient status: Outpatient. Study date: Study date: 04/08/2014. Study time: 11:16 AM. Location: [facility]  -------------------------------------------------------------------  ------------------------------------------------------------------- Left ventricle:  Wall thickness was increased in a pattern of moderate LVH.  Systolic function was mildly reduced. The estimated ejection fraction was in  the range of 45% to 50%. Wall motion was normal; there were no regional wall motion abnormalities. The study was not technically sufficient to allow evaluation of LV diastolic dysfunction due to atrial fibrillation. Indeterminate mean left atrial pressure.  ------------------------------------------------------------------- Aortic valve:  Trileaflet; mildly thickened, mildly calcified leaflets. Doppler:  There was mild stenosis.  There was no significant regurgitation.  VTI ratio of LVOT to aortic valve: 0.26. Valve area (VTI): 1.07 cm^2. Indexed valve area (VTI): 0.48 cm^2/m^2. Mean  velocity ratio of LVOT to aortic valve: 0.28. Valve area (Vmean): 1.16 cm^2. Indexed valve area (Vmean): 0.52 cm^2/m^2.  Mean gradient (S): 17 mm Hg.  ------------------------------------------------------------------- Aorta: Ascending aorta: The ascending aorta was mildly dilated.  ------------------------------------------------------------------- Mitral valve:  Calcified annulus. Mildly thickened leaflets . Leaflet separation was normal. Doppler: Transvalvular velocity was within the normal range. There was no evidence for stenosis. There was mild regurgitation.  Peak gradient (D): 3 mm Hg.  ------------------------------------------------------------------- Left atrium: The atrium was mildly dilated.  ------------------------------------------------------------------- Right ventricle: The cavity size was normal. Wall thickness was normal. Systolic function was normal.  ------------------------------------------------------------------- Pulmonic valve:  The valve appears to be grossly normal. Doppler: There was no significant regurgitation.  ------------------------------------------------------------------- Pulmonary artery:  Systolic pressure was moderately increased.  ------------------------------------------------------------------- Right atrium: The atrium was mildly  dilated.  ------------------------------------------------------------------- Systemic veins: Inferior vena cava: The vessel was dilated. The respirophasic diameter changes were blunted (< 50%), consistent with elevated central venous pressure.  ------------------------------------------------------------------- Measurements  Left ventricle              Value     Reference LV ID, ED, PLAX chordal      (H)   54.3 mm    43 - 52 LV ID, ES, PLAX chordal      (H)   40.2 mm    23 - 38 LV fx shortening, PLAX chordal  (L)   26  %    >=29 LV PW thickness, ED            14.4 mm    --------- IVS/LV PW ratio, ED            1.01      <=1.3 Stroke volume, 2D             60  ml    --------- Stroke volume/bsa, 2D           27  ml/m^2  --------- LV e&', lateral              7.43 cm/s   --------- LV E/e&', lateral             11.69     ---------  Ventricular septum            Value     Reference IVS thickness, ED             14.5 mm    ---------  LVOT                   Value     Reference LVOT ID, S                23  mm    --------- LVOT area                 4.15 cm^2   --------- LVOT ID                  23  mm    --------- LVOT peak velocity, S           77.8 cm/s   --------- LVOT mean velocity, S           53.2 cm/s   --------- LVOT VTI, S                14.5 cm    --------- Stroke volume (SV), LVOT DP        60.2 ml    --------- Stroke index (SV/bsa), LVOT DP  27.2 ml/m^2  ---------  Aortic valve               Value     Reference Aortic valve mean velocity, S       191  cm/s   --------- Aortic valve VTI, S             56.2 cm    --------- Aortic mean gradient, S          17  mm Hg  --------- VTI ratio, LVOT/AV            0.26      --------- Aortic valve area, VTI          1.07 cm^2   --------- Aortic valve area/bsa, VTI        0.48 cm^2/m^2 --------- Velocity ratio, mean, LVOT/AV       0.28      --------- Aortic valve area, mean velocity     1.16 cm^2   --------- Aortic valve area/bsa, mean        0.52 cm^2/m^2 --------- velocity  Aorta                   Value     Reference Aortic root ID, ED            40  mm    --------- Ascending aorta ID, A-P, S        36  mm    ---------  Left atrium                Value     Reference LA ID, A-P, ES              44  mm    --------- LA ID/bsa, A-P              1.99 cm/m^2  <=2.2 LA volume, S               92  ml    --------- LA volume/bsa, S             41.6 ml/m^2  --------- LA volume, ES, 1-p A4C          78  ml    --------- LA volume/bsa, ES, 1-p A4C        35.3 ml/m^2  --------- LA volume, ES, 1-p A2C          111  ml    --------- LA volume/bsa, ES, 1-p A2C        50.2 ml/m^2  ---------  Mitral valve               Value     Reference Mitral E-wave peak velocity        86.87 cm/s   --------- Mitral deceleration time         215  ms    150 - 230 Mitral peak gradient, D          3   mm Hg  ---------  Pulmonary arteries            Value     Reference PA pressure, S, DP        (H)   50  mm Hg  <=30  Tricuspid valve              Value     Reference Tricuspid regurg peak velocity      324  cm/s   --------- Tricuspid peak RV-RA  gradient  42  mm Hg  ---------  Systemic veins              Value     Reference Estimated CVP               8   mm Hg  ---------  Right ventricle              Value     Reference RV pressure, S, DP        (H)   50  mm Hg  <=30  Legend: (L) and (H) mark values outside specified reference range.  ------------------------------------------------------------------- Lenard Simmer, MD [id number]:31:52  Cardiac Cath:   Conclusion    1. Mid RCA lesion, 95% stenosed. 2. Prox RCA lesion, 60% stenosed.  FINAL CONCLUSIONS:  SEVERE MULTIVESSEL CAD WITH DIFFUSE PROXIMAL LAD STENOSIS, SEVERE OM1 STENOSIS, AND TOTAL OCCLUSION OF THE RCA WITH LEFT-TO-RIGHT COLLATERALS  MILD SEGMENTAL LV SYSTOLIC DYSFUNCTION WITH NORMAL LVEDP  RECOMMENDATIONS:  DIFFICULT SITUATION IN THIS ELDERLY GENTLEMAN WITH PROGRESSIVE DECLINE IN FUNCTIONAL CAPACITY, PRIMARILY RELATED TO WORSENING SHORTNESS OF BREATH. IN MY OPINION HIS CORONARY DISEASE WOULD CLEARLY BE BEST TREATED WITH CABG (MULTIVESSEL DIFFUSE DISEASE, DIABETES, LV SYSTOLIC DYSFUNCTION). HOWEVER, I AM CONCERNED ABOUT HIS RISK OF CABG AT AGE 59. PCI +/- MEDICAL THERAPY IS ANOTHER OPTION BUT BECAUSE OF RCA TOTAL OCCLUSION, DIFFUSE LAD STENOSIS EXTENDING BACK TO THE OSTIUM, NEED FOR CHRONIC ANTICOAGULATION, THIS APPROACH ALSO HAS SIGNIFICANT LIMITATIONS.   WILL ADMIT, CYCLE ENZYMES AS THE PATIENT HAD RESTING CHEST PAIN TODAY, AND REVIEW FILMS/CLINICAL SCENARIO WITH COLLEAGUES  DISCUSSED AT LENGTH WITH PATIENT AND FAMILY     Coronary Findings    Dominance: Co-dominant   Left Anterior Descending   . Prox LAD lesion, 80% stenosed. calcified diffuse .   Marland Kitchen Mid LAD lesion, 80% stenosed. calcified diffuse .     Left Circumflex   . Dist Cx lesion, 40% stenosed. diffuse .   Marland Kitchen First Obtuse Marginal Branch   . 1st Mrg lesion, 90% stenosed.  calcified .   Marland Kitchen Third Biomedical engineer   . 3rd Mrg lesion, 70% stenosed. Ostial lesion     Right Coronary Artery  Dist RCA filled by collaterals from Dist Cx.   . Prox RCA lesion, 60% stenosed. calcified diffuse .   Marland Kitchen Mid RCA lesion, 95% stenosed.   . Dist RCA lesion, 100% stenosed.      Wall Motion                 Left Heart    Left Ventricle There is mild left ventricular systolic dysfunction. The left ventricular ejection fraction is 45-50% by visual estimate. There are wall motion abnormalities in the left ventricle. There are segmental wall motion abnormalities in the left ventricle.    Coronary Diagrams    Diagnostic Diagram            Implants    Name ID Temporary Type Supply   No information to display    Order-Level Documents:    There are no order-level documents.    Encounter-Level Documents - 07/23/14:      Scan on 07/31/2014 12:29 PM by Provider Default, MDScan on 07/31/2014 12:29 PM by Provider Default, MD     Scan on 07/31/2014 12:06 PM by Provider Default, MDScan on 07/31/2014 12:06 PM by Provider Default, MD     Scan on 07/30/2014 9:30 AM by Provider Default, MDScan on 07/30/2014 9:30 AM by Provider Default, MD  Scan on 07/29/2014 3:22 PM by Provider Default, MDScan on 07/29/2014 3:22 PM by Provider Default, MD     Electronic signature on 07/29/2014 10:49 AM    Signed    Electronically signed by [name], MD on 07/29/14 at 1618 EDT     Impression:  He has severe multivessel coronary artery disease with mild LV dysfunction with progressive shortness of breath, fatigue and chest pain that he is treating with multiple NTG per day. He is 69 but still very functional until the past 6 months when he has been extremely limited by these symptoms. He is at increased risk for CABG but I think it is still the best treatment for him. He has a mean aortic valve gradient on his echo in February so I would repeat that to see what  his valve looks like since he may have significant aortic stenosis. I discussed the option of surgery, alternatives, benefits and risks with him and his family and he is going to think about it. We will get another echo to assess the aortic valve.  Plan:  He will let us know if he wants to proceed with CABG. We will follow up on his echo to decide if AVR is also indicated if surgery is done.  [name], MD [facility] [phone]

## 2014-10-20 ENCOUNTER — Other Ambulatory Visit: Payer: Self-pay | Admitting: *Deleted

## 2014-10-20 ENCOUNTER — Other Ambulatory Visit (HOSPITAL_COMMUNITY): Payer: Self-pay | Admitting: *Deleted

## 2014-10-20 DIAGNOSIS — I2510 Atherosclerotic heart disease of native coronary artery without angina pectoris: Secondary | ICD-10-CM

## 2014-10-20 DIAGNOSIS — I350 Nonrheumatic aortic (valve) stenosis: Secondary | ICD-10-CM

## 2014-10-21 ENCOUNTER — Encounter (HOSPITAL_COMMUNITY): Payer: Self-pay

## 2014-10-21 ENCOUNTER — Ambulatory Visit (HOSPITAL_COMMUNITY)
Admission: RE | Admit: 2014-10-21 | Discharge: 2014-10-21 | Disposition: A | Discharge status: Home / Self Care | Payer: Medicare Other | Source: Ambulatory Visit | Attending: Surgery | Admitting: Surgery

## 2014-10-21 ENCOUNTER — Encounter (HOSPITAL_COMMUNITY)
Admission: RE | Admit: 2014-10-21 | Discharge: 2014-10-21 | Disposition: A | Discharge status: Home / Self Care | Payer: Medicare Other | Source: Ambulatory Visit | Attending: Surgery | Admitting: Surgery

## 2014-10-21 VITALS — BP 93/53 | HR 66 | Temp 97.6°F | Resp 18 | Ht 73.0 in | Wt 207.0 lb

## 2014-10-21 DIAGNOSIS — I10 Essential (primary) hypertension: Secondary | ICD-10-CM | POA: Insufficient documentation

## 2014-10-21 DIAGNOSIS — I6523 Occlusion and stenosis of bilateral carotid arteries: Secondary | ICD-10-CM | POA: Insufficient documentation

## 2014-10-21 DIAGNOSIS — Z87891 Personal history of nicotine dependence: Secondary | ICD-10-CM | POA: Diagnosis not present

## 2014-10-21 DIAGNOSIS — Z7902 Long term (current) use of antithrombotics/antiplatelets: Secondary | ICD-10-CM | POA: Insufficient documentation

## 2014-10-21 DIAGNOSIS — Z0183 Encounter for blood typing: Secondary | ICD-10-CM | POA: Diagnosis not present

## 2014-10-21 DIAGNOSIS — I350 Nonrheumatic aortic (valve) stenosis: Secondary | ICD-10-CM

## 2014-10-21 DIAGNOSIS — Z79899 Other long term (current) drug therapy: Secondary | ICD-10-CM | POA: Insufficient documentation

## 2014-10-21 DIAGNOSIS — I2510 Atherosclerotic heart disease of native coronary artery without angina pectoris: Secondary | ICD-10-CM | POA: Diagnosis not present

## 2014-10-21 DIAGNOSIS — Z01812 Encounter for preprocedural laboratory examination: Secondary | ICD-10-CM | POA: Insufficient documentation

## 2014-10-21 DIAGNOSIS — I5032 Chronic diastolic (congestive) heart failure: Secondary | ICD-10-CM | POA: Diagnosis not present

## 2014-10-21 DIAGNOSIS — I517 Cardiomegaly: Secondary | ICD-10-CM | POA: Diagnosis not present

## 2014-10-21 DIAGNOSIS — E119 Type 2 diabetes mellitus without complications: Secondary | ICD-10-CM | POA: Insufficient documentation

## 2014-10-21 DIAGNOSIS — Z8673 Personal history of transient ischemic attack (TIA), and cerebral infarction without residual deficits: Secondary | ICD-10-CM | POA: Insufficient documentation

## 2014-10-21 DIAGNOSIS — Z794 Long term (current) use of insulin: Secondary | ICD-10-CM | POA: Diagnosis not present

## 2014-10-21 DIAGNOSIS — I4891 Unspecified atrial fibrillation: Secondary | ICD-10-CM | POA: Insufficient documentation

## 2014-10-21 DIAGNOSIS — Z01818 Encounter for other preprocedural examination: Secondary | ICD-10-CM | POA: Insufficient documentation

## 2014-10-21 DIAGNOSIS — Z7982 Long term (current) use of aspirin: Secondary | ICD-10-CM | POA: Diagnosis not present

## 2014-10-21 HISTORY — DX: Chronic obstructive pulmonary disease, unspecified: J44.9

## 2014-10-21 HISTORY — DX: Cardiac arrhythmia, unspecified: I49.9

## 2014-10-21 HISTORY — DX: Anxiety disorder, unspecified: F41.9

## 2014-10-21 LAB — COMPREHENSIVE METABOLIC PANEL
ALK PHOS: 56 U/L (ref 38–126)
ALT: 14 U/L — AB (ref 17–63)
AST: 20 U/L (ref 15–41)
Albumin: 3.9 g/dL (ref 3.5–5.0)
Anion gap: 13 (ref 5–15)
BILIRUBIN TOTAL: 0.6 mg/dL (ref 0.3–1.2)
BUN: 33 mg/dL — AB (ref 6–20)
CALCIUM: 10 mg/dL (ref 8.9–10.3)
CO2: 17 mmol/L — ABNORMAL LOW (ref 22–32)
CREATININE: 1.72 mg/dL — AB (ref 0.61–1.24)
Chloride: 106 mmol/L (ref 101–111)
GFR, EST AFRICAN AMERICAN: 40 mL/min — AB (ref >60)
GFR, EST NON AFRICAN AMERICAN: 34 mL/min — AB (ref >60)
Glucose, Bld: 150 mg/dL — ABNORMAL HIGH (ref 65–99)
Potassium: 4.8 mmol/L (ref 3.5–5.1)
Sodium: 136 mmol/L (ref 135–145)
TOTAL PROTEIN: 6.7 g/dL (ref 6.5–8.1)

## 2014-10-21 LAB — SURGICAL PCR SCREEN
MRSA, PCR: POSITIVE — AB
STAPHYLOCOCCUS AUREUS: POSITIVE — AB

## 2014-10-21 LAB — URINALYSIS, ROUTINE W REFLEX MICROSCOPIC

## 2014-10-21 LAB — CBC
HEMATOCRIT: 36.9 % — AB (ref 39.0–52.0)
HEMOGLOBIN: 12.6 g/dL — AB (ref 13.0–17.0)
MCH: 29.8 pg (ref 26.0–34.0)
MCHC: 34.1 g/dL (ref 30.0–36.0)
MCV: 87.2 fL (ref 78.0–100.0)
PLATELETS: 157 10*3/uL (ref 150–400)
RBC: 4.23 MIL/uL (ref 4.22–5.81)
RDW: 14.8 % (ref 11.5–15.5)
WBC: 8.5 10*3/uL (ref 4.0–10.5)

## 2014-10-21 LAB — BLOOD GAS, ARTERIAL
ACID-BASE DEFICIT: 4.6 mmol/L — AB (ref 0.0–2.0)
Bicarbonate: 19.3 mEq/L — ABNORMAL LOW (ref 20.0–24.0)
Drawn by: 206361
FIO2: 0.21
O2 Saturation: 95.9 %
PCO2 ART: 31.4 mmHg — AB (ref 35.0–45.0)
PH ART: 7.406 (ref 7.350–7.450)
PO2 ART: 84.1 mmHg (ref 80.0–100.0)
Patient temperature: 98.6
TCO2: 20.3 mmol/L (ref 0–100)

## 2014-10-21 LAB — GLUCOSE, CAPILLARY: Glucose-Capillary: 202 mg/dL — ABNORMAL HIGH (ref 65–99)

## 2014-10-21 LAB — PROTIME-INR
INR: 1.75 — AB (ref 0.00–1.49)
PROTHROMBIN TIME: 20.4 s — AB (ref 11.6–15.2)

## 2014-10-21 LAB — ABO/RH: ABO/RH(D): O POS

## 2014-10-21 LAB — APTT: APTT: 130 s — AB (ref 24–37)

## 2014-10-21 NOTE — Progress Notes (Signed)
Pre-op Cardiac Surgery  Carotid Findings:  Right = 60-79% ICA stenosis. Left = 40-59%, borderline >60%, ICA stenosis. No significant change from previous study 11/2014.   Upper Extremity Right Left  Brachial Pressures 131 126  Radial Waveforms Tri Tri  Ulnar Waveforms Tri Tri  Palmar Arch (Allen's Test) Obliterates with radial compression, normal with ulnar compression Obliterates with radial compression, normal with ulnar compression    Farrel Demark, RDMS, RVT 10/21/2014

## 2014-10-21 NOTE — Pre-Procedure Instructions (Addendum)
Frank Marks  10/21/2014      [id number] [place] - [address] [place] [address] AT [facility] [address] [place] [address] Phone: [phone] Fax: [phone]  [facility] [address], [place] - [address] [place] [address] Phone: [phone] Fax: [phone]    Your procedure is scheduled on 10/29/2014-Thursday   Report to [facility] Admitting at 5:30 A.M.  Call this number if you have problems the morning of surgery:  [phone]         TAKE LAST DOSE OF PRADAXA on 10/23/2014  FRIDAY   Remember:  Do not eat food or drink liquids after midnight.   Take these medicines the morning of surgery with A SIP OF WATER: Metoprolol, Ditilazem & IMDUR   Do not wear jewelry   Do not wear lotions, powders, or perfumes.  You may wear deodorant.    Men may shave face and neck.   Do not bring valuables to the hospital.   [facility] is not responsible for any belongings or valuables.  Contacts, dentures or bridgework may not be worn into surgery.  Leave your suitcase in the car.  After surgery it may be brought to your room.  BRING the HEART BOOK & BREATHER EXERCISER- the day of surgery  For patients admitted to the hospital, discharge time will be determined by your treatment team.  Patients discharged the day of surgery will not be allowed to drive home.   Name and phone number of your driver:   /w family Special instructions:  Special Instructions:  - Preparing for Surgery  Before surgery, you can play an important role.  Because skin is not sterile, your skin needs to be as free of germs as possible.  You can reduce the number of germs on you skin by washing with CHG (chlorahexidine gluconate) soap before surgery.  CHG is an antiseptic cleaner which kills germs and bonds with the skin to continue killing germs even after washing.  Please DO NOT use if you have an allergy to  CHG or antibacterial soaps.  If your skin becomes reddened/irritated stop using the CHG and inform your nurse when you arrive at Short Stay.  Do not shave (including legs and underarms) for at least 48 hours prior to the first CHG shower.  You may shave your face.  Please follow these instructions carefully:   1.  Shower with CHG Soap the night before surgery and the  morning of Surgery.  2.  If you choose to wash your hair, wash your hair first as usual with your  normal shampoo.  3.  After you shampoo, rinse your hair and body thoroughly to remove the  Shampoo.  4.  Use CHG as you would any other liquid soap.  You can apply chg directly to the skin and wash gently with scrungie or a clean washcloth.  5.  Apply the CHG Soap to your body ONLY FROM THE NECK DOWN.    Do not use on open wounds or open sores.  Avoid contact with your eyes, ears, mouth and genitals (private parts).  Wash genitals (private parts)   with your normal soap.  6.  Wash thoroughly, paying special attention to the area where your surgery will be performed.  7.  Thoroughly rinse your body with warm water from the neck down.  8.  DO NOT shower/wash with your normal soap  after using and rinsing off   the CHG Soap.  9.  Pat yourself dry with a clean towel.            10.  Wear clean pajamas.            11.  Place clean sheets on your bed the night of your first shower and do not sleep with pets.  Day of Surgery  Do not apply any lotions/deodorants the morning of surgery.  Please wear clean clothes to the hospital/surgery center.    How to Manage Your Diabetes Before Surgery   Why is it important to control my blood sugar before and after surgery?   Improving blood sugar levels before and after surgery helps healing and can limit problems.  A way of improving blood sugar control is eating a healthy diet by:  - Eating less sugar and carbohydrates  - Increasing activity/exercise  - Talk with your doctor about reaching  your blood sugar goals  High blood sugars (greater than 180 mg/dL) can raise your risk of infections and slow down your recovery so you will need to focus on controlling your diabetes during the weeks before surgery.  Make sure that the doctor who takes care of your diabetes knows about your planned surgery including the date and location.  How do I manage my blood sugars before surgery?   Check your blood sugar at least 4 times a day, 2 days before surgery to make sure that they are not too high or low.   Check your blood sugar the morning of your surgery when you wake up and every 2               hours until you get to the Short-Stay unit.  If your blood sugar is less than 70 mg/dL, you will need to treat for low blood sugar by:  Treat a low blood sugar (less than 70 mg/dL) with 1/2 cup of clear juice (cranberry or apple), 4 glucose tablets, OR glucose gel.  Recheck blood sugar in 15 minutes after treatment (to make sure it is greater than 70 mg/dL).  If blood sugar is not greater than 70 mg/dL on re-check, call [phone] for further instructions.   Report your blood sugar to the Short-Stay nurse when you get to Short-Stay.  References:  [facility], 2007 "How to Manage your Diabetes Before and After Surgery".  What do I do about my diabetes medications?        Night before surgery: take all usual medicine    Do not take oral diabetes medicines (pills) the morning of surgery.             IF YOUR BLOOD SUGAR IS GREATER THAN 220 THE MORNING OF SURGERY, take 10 units of LANTUS Insulin                 Please read over the following fact sheets that you were given. Pain Booklet, Coughing and Deep Breathing, Blood Transfusion Information, Open Heart Packet, MRSA Information and Surgical Site Infection Prevention

## 2014-10-21 NOTE — Progress Notes (Signed)
Pharm. Tech into office for the Med. Rec.

## 2014-10-21 NOTE — Progress Notes (Signed)
PFT's done in July 2016- suffices for this surgery, per Alycia Rossetti.  Pt. Instructed on change of surgery date relative to stopping Pradaxa.  Pt. Informed to take last dose of Pradaxa on 10/23/2014.

## 2014-10-21 NOTE — Progress Notes (Signed)
Left voice mail on Frank Marks's cellphone for result of swab & need to go to pharma. & get  Mupirocin started.  Call to [facility], ordered mupirocin.

## 2014-10-21 NOTE — Progress Notes (Signed)
Call to Dr. Garen Grams office, spoke with Alycia Rossetti, she will check on Pradaxa schedule.

## 2014-10-22 ENCOUNTER — Encounter (HOSPITAL_COMMUNITY): Payer: Self-pay

## 2014-10-22 LAB — HEMOGLOBIN A1C
HEMOGLOBIN A1C: 6.5 % — AB (ref 4.8–5.6)
Mean Plasma Glucose: 140 mg/dL

## 2014-10-22 NOTE — Progress Notes (Signed)
Anesthesia Chart Review:  Pt is 81 year old male scheduled for CABG, AVR, TEE on 10/29/2014 with Dr. Laneta Simmers.   PMH includes: CAD, chronic diastolic CHF, stroke, TIA, HTN, DM, atrial fibrillation, carotid artery stenosis. Former smoker. BMI 27.   Medications include: albuterol, ASA, pradaxa, diltiazem, glimepiride, imdur, lantus, lisinopril, metformin, metoprolol, pravastatin, ranolazine.   Preoperative labs reviewed.  PTT 130, PT 20.4. Creatinine 1.72 (prior results 0.94-1.3 over last 2 years). BUN 33. HgbA1c 6.5, glucose 150. Notified Ryan in Dr. Sharee Pimple office about coags and creatinine. She will discuss with Dr. Laneta Simmers.   Chest x-ray 10/21/2014 reviewed.  1. Cardiomegaly. No pulmonary venous congestion. 2. No acute pulmonary disease.  EKG 10/02/2014: atrial fibrillation with slow ventricular response. LAD. Incomplete RBBB. Septal infarct, age undetermined.   Carotid duplex US 10/21/2014:  - Findings consistent with 60 - 79 % stenosis involving the right internal carotid artery. - Findings consistent with 40 - 59 %, borderline >60%, stenosis involving the left internal carotid artery. - ICA/CCA ratio. right = 1.31. left = 2.47.  Echo 10/16/2014:  - Left ventricle: The cavity size was normal. Wall thickness was increased in a pattern of mild LVH. Indeterminant diastolic function (atrial fibrillation). The estimated ejection fraction was 45%. Diffuse hypokinesis. GLS -11.4%, abnormal strain. - Aortic valve: Trileaflet but with partial fusion of the noncoronary and left coronary cusps. Moderately calcified leaflets. Mild aortic stenosis by mean gradient, moderate-severeby calculated valve area. I suspect low gradient due to low flow, suspect aortic stenosis is moderate. It is moderate by planimetry. Mean gradient (S): 17 mm Hg. Valve area (VTI): 1.0cm^2. - Aorta: Mildly dilated aortic root. Aortic root dimension: 38 mm (ED). - Mitral valve: Moderately calcified annulus. There was trivial  regurgitation. - Left atrium: The atrium was severely dilated. - Right ventricle: The cavity size was normal. Systolic function was normal. - Right atrium: The atrium was moderately dilated. - Tricuspid valve: Peak RV-RA gradient (S): 43 mm Hg. - Pulmonary arteries: PA peak pressure: 46 mm Hg (S). - Inferior vena cava: The vessel was normal in size. The respirophasic diameter changes were in the normal range (= 50%), consistent with normal central venous pressure. -Impressions: The patient was in atrial fibrillation. Normal LV size with mildLV hypertrophy. EF 45% with diffuse hypokinesis. Biatrial enlargement. Normal RV size and systolic function. Moderate aortic stenosis.  Cardiac cath 07/29/2014:   SEVERE MULTIVESSEL CAD WITH DIFFUSE PROXIMAL LAD STENOSIS, SEVERE OM1 STENOSIS, AND TOTAL OCCLUSION OF THE RCA WITH LEFT-TO-RIGHT COLLATERALS  MILD SEGMENTAL LV SYSTOLIC DYSFUNCTION WITH NORMAL LVEDP  PFTs 08/25/2014: FEV1 2.12 L (70% predicted), total lung capacity 6.01 L (78% predicted), DLCO 13.99 (38% predicted)  Rica Mast, FNP-BC [facility]/Anesthesiology Phone: [phone] 10/22/2014 3:30 PM

## 2014-10-28 MED ORDER — NITROGLYCERIN IN D5W 200-5 MCG/ML-% IV SOLN
2.0000 ug/min | INTRAVENOUS | Status: AC
  Administered 2014-10-29: 5 ug/min via INTRAVENOUS
  Filled 2014-10-28: qty 250

## 2014-10-28 MED ORDER — CEFUROXIME SODIUM 750 MG IJ SOLR
750.0000 mg | INTRAMUSCULAR | Status: DC
  Filled 2014-10-28: qty 750

## 2014-10-28 MED ORDER — DOPAMINE-DEXTROSE 3.2-5 MG/ML-% IV SOLN
0.0000 ug/kg/min | INTRAVENOUS | Status: AC
  Administered 2014-10-29: 3 ug/kg/min via INTRAVENOUS
  Filled 2014-10-28: qty 250

## 2014-10-28 MED ORDER — PLASMA-LYTE 148 IV SOLN
INTRAVENOUS | Status: AC
  Administered 2014-10-29: 500 mL
  Filled 2014-10-28: qty 2.5

## 2014-10-28 MED ORDER — DEXTROSE 5 % IV SOLN
1.5000 g | INTRAVENOUS | Status: AC
  Administered 2014-10-29: 750 g via INTRAVENOUS
  Administered 2014-10-29: 1.5 g via INTRAVENOUS
  Filled 2014-10-28: qty 1.5

## 2014-10-28 MED ORDER — SODIUM CHLORIDE 0.9 % IV SOLN
INTRAVENOUS | Status: AC
  Administered 2014-10-29: 2.2 [IU]/h via INTRAVENOUS
  Filled 2014-10-28: qty 2.5

## 2014-10-28 MED ORDER — VANCOMYCIN HCL 10 G IV SOLR
1250.0000 mg | INTRAVENOUS | Status: AC
  Administered 2014-10-29: 1250 mg via INTRAVENOUS
  Filled 2014-10-28: qty 1250

## 2014-10-28 MED ORDER — MAGNESIUM SULFATE 50 % IJ SOLN
40.0000 meq | INTRAMUSCULAR | Status: DC
Start: 2014-10-29 — End: 2014-10-29
  Filled 2014-10-28: qty 10

## 2014-10-28 MED ORDER — EPINEPHRINE HCL 1 MG/ML IJ SOLN
0.0000 ug/min | INTRAVENOUS | Status: DC
  Filled 2014-10-28: qty 4

## 2014-10-28 MED ORDER — SODIUM CHLORIDE 0.9 % IV SOLN
INTRAVENOUS | Status: DC
  Filled 2014-10-28: qty 30

## 2014-10-28 MED ORDER — DEXMEDETOMIDINE HCL IN NACL 400 MCG/100ML IV SOLN
0.1000 ug/kg/h | INTRAVENOUS | Status: AC
  Administered 2014-10-29: .3 ug/kg/h via INTRAVENOUS
  Filled 2014-10-28: qty 100

## 2014-10-28 MED ORDER — DEXTROSE 5 % IV SOLN
30.0000 ug/min | INTRAVENOUS | Status: AC
  Administered 2014-10-29: 40 ug/min via INTRAVENOUS
  Filled 2014-10-28: qty 2

## 2014-10-28 MED ORDER — SODIUM CHLORIDE 0.9 % IV SOLN
INTRAVENOUS | Status: AC
  Administered 2014-10-29: 69.8 mL/h via INTRAVENOUS
  Filled 2014-10-28: qty 40

## 2014-10-28 MED ORDER — POTASSIUM CHLORIDE 2 MEQ/ML IV SOLN
80.0000 meq | INTRAVENOUS | Status: DC
  Filled 2014-10-28: qty 40

## 2014-10-29 ENCOUNTER — Encounter (HOSPITAL_COMMUNITY)
Admission: RE | Disposition: A | Discharge status: Skilled Nursing Facility | Payer: Medicare Other | Source: Ambulatory Visit | Attending: Surgery

## 2014-10-29 ENCOUNTER — Inpatient Hospital Stay (HOSPITAL_COMMUNITY): Payer: Medicare Other | Admitting: Anesthesiology

## 2014-10-29 ENCOUNTER — Inpatient Hospital Stay (HOSPITAL_COMMUNITY): Payer: Medicare Other

## 2014-10-29 ENCOUNTER — Inpatient Hospital Stay (HOSPITAL_COMMUNITY)
Admission: RE | Admit: 2014-10-29 | Discharge: 2014-11-10 | DRG: 219 | Disposition: A | Discharge status: Skilled Nursing Facility | Payer: Medicare Other | Source: Ambulatory Visit | Attending: Surgery | Admitting: Surgery

## 2014-10-29 ENCOUNTER — Encounter (HOSPITAL_COMMUNITY): Payer: Self-pay | Admitting: *Deleted

## 2014-10-29 ENCOUNTER — Inpatient Hospital Stay (HOSPITAL_COMMUNITY): Payer: Medicare Other | Admitting: Emergency Medicine

## 2014-10-29 ENCOUNTER — Encounter (HOSPITAL_COMMUNITY)
Admission: RE | Disposition: A | Discharge status: Skilled Nursing Facility | Payer: Self-pay | Source: Ambulatory Visit | Attending: Surgery

## 2014-10-29 DIAGNOSIS — Z79899 Other long term (current) drug therapy: Secondary | ICD-10-CM

## 2014-10-29 DIAGNOSIS — D696 Thrombocytopenia, unspecified: Secondary | ICD-10-CM | POA: Diagnosis not present

## 2014-10-29 DIAGNOSIS — R451 Restlessness and agitation: Secondary | ICD-10-CM | POA: Diagnosis not present

## 2014-10-29 DIAGNOSIS — Z7982 Long term (current) use of aspirin: Secondary | ICD-10-CM

## 2014-10-29 DIAGNOSIS — M545 Low back pain: Secondary | ICD-10-CM | POA: Diagnosis present

## 2014-10-29 DIAGNOSIS — M96831 Postprocedural hemorrhage and hematoma of a musculoskeletal structure following other procedure: Secondary | ICD-10-CM | POA: Diagnosis not present

## 2014-10-29 DIAGNOSIS — Z952 Presence of prosthetic heart valve: Secondary | ICD-10-CM

## 2014-10-29 DIAGNOSIS — Z87891 Personal history of nicotine dependence: Secondary | ICD-10-CM | POA: Diagnosis not present

## 2014-10-29 DIAGNOSIS — E780 Pure hypercholesterolemia: Secondary | ICD-10-CM | POA: Diagnosis not present

## 2014-10-29 DIAGNOSIS — Z981 Arthrodesis status: Secondary | ICD-10-CM | POA: Diagnosis not present

## 2014-10-29 DIAGNOSIS — I2510 Atherosclerotic heart disease of native coronary artery without angina pectoris: Secondary | ICD-10-CM | POA: Diagnosis present

## 2014-10-29 DIAGNOSIS — Z7902 Long term (current) use of antithrombotics/antiplatelets: Secondary | ICD-10-CM | POA: Diagnosis not present

## 2014-10-29 DIAGNOSIS — I350 Nonrheumatic aortic (valve) stenosis: Secondary | ICD-10-CM | POA: Diagnosis present

## 2014-10-29 DIAGNOSIS — I482 Chronic atrial fibrillation: Secondary | ICD-10-CM | POA: Diagnosis not present

## 2014-10-29 DIAGNOSIS — Z823 Family history of stroke: Secondary | ICD-10-CM | POA: Diagnosis not present

## 2014-10-29 DIAGNOSIS — Y838 Other surgical procedures as the cause of abnormal reaction of the patient, or of later complication, without mention of misadventure at the time of the procedure: Secondary | ICD-10-CM | POA: Diagnosis present

## 2014-10-29 DIAGNOSIS — Z96653 Presence of artificial knee joint, bilateral: Secondary | ICD-10-CM | POA: Diagnosis not present

## 2014-10-29 DIAGNOSIS — Z8673 Personal history of transient ischemic attack (TIA), and cerebral infarction without residual deficits: Secondary | ICD-10-CM | POA: Diagnosis not present

## 2014-10-29 DIAGNOSIS — D62 Acute posthemorrhagic anemia: Secondary | ICD-10-CM | POA: Diagnosis not present

## 2014-10-29 DIAGNOSIS — F05 Delirium due to known physiological condition: Secondary | ICD-10-CM | POA: Diagnosis not present

## 2014-10-29 DIAGNOSIS — F419 Anxiety disorder, unspecified: Secondary | ICD-10-CM | POA: Diagnosis not present

## 2014-10-29 DIAGNOSIS — I10 Essential (primary) hypertension: Secondary | ICD-10-CM | POA: Diagnosis present

## 2014-10-29 DIAGNOSIS — G8929 Other chronic pain: Secondary | ICD-10-CM | POA: Diagnosis not present

## 2014-10-29 DIAGNOSIS — R791 Abnormal coagulation profile: Secondary | ICD-10-CM | POA: Diagnosis present

## 2014-10-29 DIAGNOSIS — M1990 Unspecified osteoarthritis, unspecified site: Secondary | ICD-10-CM | POA: Diagnosis present

## 2014-10-29 DIAGNOSIS — E119 Type 2 diabetes mellitus without complications: Secondary | ICD-10-CM | POA: Diagnosis not present

## 2014-10-29 DIAGNOSIS — E876 Hypokalemia: Secondary | ICD-10-CM | POA: Diagnosis not present

## 2014-10-29 DIAGNOSIS — D72829 Elevated white blood cell count, unspecified: Secondary | ICD-10-CM | POA: Diagnosis not present

## 2014-10-29 DIAGNOSIS — I2582 Chronic total occlusion of coronary artery: Secondary | ICD-10-CM | POA: Diagnosis present

## 2014-10-29 DIAGNOSIS — N179 Acute kidney failure, unspecified: Secondary | ICD-10-CM | POA: Diagnosis not present

## 2014-10-29 DIAGNOSIS — Z794 Long term (current) use of insulin: Secondary | ICD-10-CM | POA: Diagnosis not present

## 2014-10-29 DIAGNOSIS — Z951 Presence of aortocoronary bypass graft: Secondary | ICD-10-CM

## 2014-10-29 DIAGNOSIS — Z9689 Presence of other specified functional implants: Secondary | ICD-10-CM

## 2014-10-29 DIAGNOSIS — J449 Chronic obstructive pulmonary disease, unspecified: Secondary | ICD-10-CM | POA: Diagnosis present

## 2014-10-29 DIAGNOSIS — I5033 Acute on chronic diastolic (congestive) heart failure: Secondary | ICD-10-CM | POA: Diagnosis present

## 2014-10-29 DIAGNOSIS — I9789 Other postprocedural complications and disorders of the circulatory system, not elsewhere classified: Secondary | ICD-10-CM | POA: Diagnosis not present

## 2014-10-29 DIAGNOSIS — Z96642 Presence of left artificial hip joint: Secondary | ICD-10-CM | POA: Diagnosis not present

## 2014-10-29 DIAGNOSIS — H9190 Unspecified hearing loss, unspecified ear: Secondary | ICD-10-CM | POA: Diagnosis present

## 2014-10-29 HISTORY — PX: CORONARY ARTERY BYPASS GRAFT: SHX141

## 2014-10-29 HISTORY — PX: AORTIC VALVE REPLACEMENT: SHX41

## 2014-10-29 HISTORY — PX: TEE WITHOUT CARDIOVERSION: SHX5443

## 2014-10-29 HISTORY — PX: EXPLORATION POST OPERATIVE OPEN HEART: SHX5061

## 2014-10-29 LAB — POCT I-STAT, CHEM 8
BUN: 23 mg/dL — ABNORMAL HIGH (ref 6–20)
BUN: 22 mg/dL — AB (ref 6–20)
BUN: 24 mg/dL — AB (ref 6–20)
BUN: 23 mg/dL — AB (ref 6–20)
BUN: 24 mg/dL — AB (ref 6–20)
BUN: 20 mg/dL (ref 6–20)
CALCIUM ION: 1.17 mmol/L (ref 1.13–1.30)
CHLORIDE: 103 mmol/L (ref 101–111)
CHLORIDE: 98 mmol/L — AB (ref 101–111)
CHLORIDE: 102 mmol/L (ref 101–111)
CREATININE: 1.1 mg/dL (ref 0.61–1.24)
CREATININE: 1.1 mg/dL (ref 0.61–1.24)
CREATININE: 1 mg/dL (ref 0.61–1.24)
CREATININE: 0.9 mg/dL (ref 0.61–1.24)
Calcium, Ion: 1.32 mmol/L — ABNORMAL HIGH (ref 1.13–1.30)
Calcium, Ion: 1.35 mmol/L — ABNORMAL HIGH (ref 1.13–1.30)
Calcium, Ion: 1.21 mmol/L (ref 1.13–1.30)
Calcium, Ion: 1.12 mmol/L — ABNORMAL LOW (ref 1.13–1.30)
Calcium, Ion: 1.17 mmol/L (ref 1.13–1.30)
Chloride: 104 mmol/L (ref 101–111)
Chloride: 100 mmol/L — ABNORMAL LOW (ref 101–111)
Chloride: 102 mmol/L (ref 101–111)
Creatinine, Ser: 1 mg/dL (ref 0.61–1.24)
Creatinine, Ser: 1.1 mg/dL (ref 0.61–1.24)
GLUCOSE: 171 mg/dL — AB (ref 65–99)
GLUCOSE: 174 mg/dL — AB (ref 65–99)
GLUCOSE: 183 mg/dL — AB (ref 65–99)
GLUCOSE: 132 mg/dL — AB (ref 65–99)
GLUCOSE: 128 mg/dL — AB (ref 65–99)
Glucose, Bld: 173 mg/dL — ABNORMAL HIGH (ref 65–99)
HCT: 33 % — ABNORMAL LOW (ref 39.0–52.0)
HCT: 26 % — ABNORMAL LOW (ref 39.0–52.0)
HCT: 20 % — ABNORMAL LOW (ref 39.0–52.0)
HEMATOCRIT: 32 % — AB (ref 39.0–52.0)
HEMATOCRIT: 26 % — AB (ref 39.0–52.0)
HEMATOCRIT: 21 % — AB (ref 39.0–52.0)
HEMOGLOBIN: 10.9 g/dL — AB (ref 13.0–17.0)
HEMOGLOBIN: 6.8 g/dL — AB (ref 13.0–17.0)
Hemoglobin: 11.2 g/dL — ABNORMAL LOW (ref 13.0–17.0)
Hemoglobin: 8.8 g/dL — ABNORMAL LOW (ref 13.0–17.0)
Hemoglobin: 8.8 g/dL — ABNORMAL LOW (ref 13.0–17.0)
Hemoglobin: 7.1 g/dL — ABNORMAL LOW (ref 13.0–17.0)
POTASSIUM: 4.4 mmol/L (ref 3.5–5.1)
POTASSIUM: 4.4 mmol/L (ref 3.5–5.1)
POTASSIUM: 4.6 mmol/L (ref 3.5–5.1)
POTASSIUM: 4.4 mmol/L (ref 3.5–5.1)
Potassium: 4.3 mmol/L (ref 3.5–5.1)
Potassium: 5.1 mmol/L (ref 3.5–5.1)
SODIUM: 135 mmol/L (ref 135–145)
SODIUM: 139 mmol/L (ref 135–145)
Sodium: 136 mmol/L (ref 135–145)
Sodium: 134 mmol/L — ABNORMAL LOW (ref 135–145)
Sodium: 136 mmol/L (ref 135–145)
Sodium: 136 mmol/L (ref 135–145)
TCO2: 23 mmol/L (ref 0–100)
TCO2: 23 mmol/L (ref 0–100)
TCO2: 25 mmol/L (ref 0–100)
TCO2: 22 mmol/L (ref 0–100)
TCO2: 22 mmol/L (ref 0–100)
TCO2: 23 mmol/L (ref 0–100)

## 2014-10-29 LAB — POCT I-STAT 3, ART BLOOD GAS (G3+)
ACID-BASE DEFICIT: 5 mmol/L — AB (ref 0.0–2.0)
ACID-BASE EXCESS: 1 mmol/L (ref 0.0–2.0)
Acid-base deficit: 2 mmol/L (ref 0.0–2.0)
BICARBONATE: 24.3 meq/L — AB (ref 20.0–24.0)
Bicarbonate: 21.9 mEq/L (ref 20.0–24.0)
Bicarbonate: 24.6 mEq/L — ABNORMAL HIGH (ref 20.0–24.0)
O2 SAT: 100 %
O2 SAT: 97 %
O2 SAT: 98 %
PCO2 ART: 33.1 mmHg — AB (ref 35.0–45.0)
PCO2 ART: 46.1 mmHg — AB (ref 35.0–45.0)
PH ART: 7.331 — AB (ref 7.350–7.450)
Patient temperature: 35.1
TCO2: 23 mmol/L (ref 0–100)
TCO2: 26 mmol/L (ref 0–100)
TCO2: 26 mmol/L (ref 0–100)
pCO2 arterial: 48.3 mmHg — ABNORMAL HIGH (ref 35.0–45.0)
pH, Arterial: 7.254 — ABNORMAL LOW (ref 7.350–7.450)
pH, Arterial: 7.475 — ABNORMAL HIGH (ref 7.350–7.450)
pO2, Arterial: 104 mmHg — ABNORMAL HIGH (ref 80.0–100.0)
pO2, Arterial: 251 mmHg — ABNORMAL HIGH (ref 80.0–100.0)
pO2, Arterial: 85 mmHg (ref 80.0–100.0)

## 2014-10-29 LAB — PREPARE RBC (CROSSMATCH)

## 2014-10-29 LAB — APTT
APTT: 52 s — AB (ref 24–37)
APTT: 46 s — AB (ref 24–37)
APTT: 39 s — AB (ref 24–37)
aPTT: 62 seconds — ABNORMAL HIGH (ref 24–37)

## 2014-10-29 LAB — CBC
HCT: 25.2 % — ABNORMAL LOW (ref 39.0–52.0)
HCT: 21.4 % — ABNORMAL LOW (ref 39.0–52.0)
HEMOGLOBIN: 7.4 g/dL — AB (ref 13.0–17.0)
Hemoglobin: 8.6 g/dL — ABNORMAL LOW (ref 13.0–17.0)
MCH: 29.8 pg (ref 26.0–34.0)
MCH: 29.7 pg (ref 26.0–34.0)
MCHC: 34.1 g/dL (ref 30.0–36.0)
MCHC: 34.6 g/dL (ref 30.0–36.0)
MCV: 87.2 fL (ref 78.0–100.0)
MCV: 85.9 fL (ref 78.0–100.0)
PLATELETS: 91 10*3/uL — AB (ref 150–400)
Platelets: 96 10*3/uL — ABNORMAL LOW (ref 150–400)
RBC: 2.89 MIL/uL — ABNORMAL LOW (ref 4.22–5.81)
RBC: 2.49 MIL/uL — AB (ref 4.22–5.81)
RDW: 14.6 % (ref 11.5–15.5)
RDW: 14.3 % (ref 11.5–15.5)
WBC: 16.5 10*3/uL — ABNORMAL HIGH (ref 4.0–10.5)
WBC: 9.4 10*3/uL (ref 4.0–10.5)

## 2014-10-29 LAB — POCT I-STAT GLUCOSE
Glucose, Bld: 185 mg/dL — ABNORMAL HIGH (ref 65–99)
Operator id: 3406

## 2014-10-29 LAB — POCT I-STAT 4, (NA,K, GLUC, HGB,HCT)
Glucose, Bld: 110 mg/dL — ABNORMAL HIGH (ref 65–99)
HCT: 25 % — ABNORMAL LOW (ref 39.0–52.0)
HEMOGLOBIN: 8.5 g/dL — AB (ref 13.0–17.0)
Potassium: 4.4 mmol/L (ref 3.5–5.1)
SODIUM: 137 mmol/L (ref 135–145)

## 2014-10-29 LAB — BASIC METABOLIC PANEL
ANION GAP: 7 (ref 5–15)
BUN: 24 mg/dL — ABNORMAL HIGH (ref 6–20)
CALCIUM: 9.9 mg/dL (ref 8.9–10.3)
CO2: 25 mmol/L (ref 22–32)
Chloride: 105 mmol/L (ref 101–111)
Creatinine, Ser: 1.55 mg/dL — ABNORMAL HIGH (ref 0.61–1.24)
GFR calc Af Amer: 45 mL/min — ABNORMAL LOW (ref >60)
GFR, EST NON AFRICAN AMERICAN: 39 mL/min — AB (ref >60)
Glucose, Bld: 156 mg/dL — ABNORMAL HIGH (ref 65–99)
POTASSIUM: 4.6 mmol/L (ref 3.5–5.1)
SODIUM: 137 mmol/L (ref 135–145)

## 2014-10-29 LAB — PROTIME-INR
INR: 1.11 (ref 0.00–1.49)
INR: 1.67 — AB (ref 0.00–1.49)
INR: 1.39 (ref 0.00–1.49)
INR: 1.32 (ref 0.00–1.49)
PROTHROMBIN TIME: 14.5 s (ref 11.6–15.2)
PROTHROMBIN TIME: 19.7 s — AB (ref 11.6–15.2)
PROTHROMBIN TIME: 16.5 s — AB (ref 11.6–15.2)
Prothrombin Time: 17.2 seconds — ABNORMAL HIGH (ref 11.6–15.2)

## 2014-10-29 LAB — DIC (DISSEMINATED INTRAVASCULAR COAGULATION)PANEL
D-Dimer, Quant: 1.08 ug/mL-FEU — ABNORMAL HIGH (ref 0.00–0.48)
Fibrinogen: 220 mg/dL (ref 204–475)
Platelets: 78 10*3/uL — ABNORMAL LOW (ref 150–400)
Prothrombin Time: 17.4 seconds — ABNORMAL HIGH (ref 11.6–15.2)
Smear Review: NONE SEEN

## 2014-10-29 LAB — MAGNESIUM: MAGNESIUM: 2.4 mg/dL (ref 1.7–2.4)

## 2014-10-29 LAB — PLATELET COUNT: Platelets: 106 10*3/uL — ABNORMAL LOW (ref 150–400)

## 2014-10-29 LAB — CREATININE, SERUM
Creatinine, Ser: 1.09 mg/dL (ref 0.61–1.24)
GFR, EST NON AFRICAN AMERICAN: 60 mL/min — AB (ref >60)

## 2014-10-29 LAB — GLUCOSE, CAPILLARY: Glucose-Capillary: 142 mg/dL — ABNORMAL HIGH (ref 65–99)

## 2014-10-29 LAB — DIC (DISSEMINATED INTRAVASCULAR COAGULATION) PANEL
APTT: 42 s — AB (ref 24–37)
INR: 1.42 (ref 0.00–1.49)

## 2014-10-29 LAB — FIBRINOGEN: FIBRINOGEN: 208 mg/dL (ref 204–475)

## 2014-10-29 LAB — HEMOGLOBIN AND HEMATOCRIT, BLOOD
HEMATOCRIT: 26 % — AB (ref 39.0–52.0)
HEMOGLOBIN: 9 g/dL — AB (ref 13.0–17.0)

## 2014-10-29 SURGERY — EXPLORATION POST OPERATIVE OPEN HEART
Anesthesia: General | Site: Chest

## 2014-10-29 SURGERY — CORONARY ARTERY BYPASS GRAFTING (CABG)
Anesthesia: General | Site: Chest

## 2014-10-29 MED ORDER — DEXMEDETOMIDINE HCL IN NACL 200 MCG/50ML IV SOLN
INTRAVENOUS | Status: DC | PRN
  Administered 2014-10-29: 0.4 ug/kg/h via INTRAVENOUS

## 2014-10-29 MED ORDER — ALBUMIN HUMAN 5 % IV SOLN
INTRAVENOUS | Status: DC | PRN
  Administered 2014-10-29 (×3): via INTRAVENOUS

## 2014-10-29 MED ORDER — ONDANSETRON HCL 4 MG/2ML IJ SOLN
4.0000 mg | Freq: Four times a day (QID) | INTRAMUSCULAR | Status: DC | PRN
  Administered 2014-10-31: 4 mg via INTRAVENOUS
  Filled 2014-10-29: qty 2

## 2014-10-29 MED ORDER — OXYCODONE HCL 5 MG PO TABS
5.0000 mg | ORAL_TABLET | ORAL | Status: DC | PRN
  Administered 2014-10-30 – 2014-10-31 (×4): 5 mg via ORAL
  Filled 2014-10-29 (×4): qty 1

## 2014-10-29 MED ORDER — PHENYLEPHRINE HCL 10 MG/ML IJ SOLN
INTRAMUSCULAR | Status: DC | PRN
  Administered 2014-10-29: 40 ug via INTRAVENOUS
  Administered 2014-10-29: 80 ug via INTRAVENOUS
  Administered 2014-10-29: 40 ug via INTRAVENOUS
  Administered 2014-10-29 (×2): 80 ug via INTRAVENOUS

## 2014-10-29 MED ORDER — MIDAZOLAM HCL 2 MG/2ML IJ SOLN
2.0000 mg | INTRAMUSCULAR | Status: DC | PRN
  Filled 2014-10-29: qty 2

## 2014-10-29 MED ORDER — POTASSIUM CHLORIDE 10 MEQ/50ML IV SOLN: 10.0000 meq | INTRAVENOUS | Status: AC

## 2014-10-29 MED ORDER — ROCURONIUM BROMIDE 50 MG/5ML IV SOLN
INTRAVENOUS | Status: AC
  Filled 2014-10-29: qty 1

## 2014-10-29 MED ORDER — FENTANYL CITRATE (PF) 250 MCG/5ML IJ SOLN
INTRAMUSCULAR | Status: AC
  Filled 2014-10-29: qty 5

## 2014-10-29 MED ORDER — LACTATED RINGERS IV SOLN
INTRAVENOUS | Status: DC | PRN
  Administered 2014-10-29 (×2): via INTRAVENOUS

## 2014-10-29 MED ORDER — ASPIRIN EC 325 MG PO TBEC
325.0000 mg | DELAYED_RELEASE_TABLET | Freq: Every day | ORAL | Status: DC
Start: 2014-10-30 — End: 2014-10-29

## 2014-10-29 MED ORDER — CETYLPYRIDINIUM CHLORIDE 0.05 % MT LIQD
7.0000 mL | Freq: Two times a day (BID) | OROMUCOSAL | Status: DC
  Administered 2014-10-29 – 2014-10-30 (×3): 7 mL via OROMUCOSAL

## 2014-10-29 MED ORDER — MORPHINE SULFATE (PF) 2 MG/ML IV SOLN
2.0000 mg | INTRAVENOUS | Status: DC | PRN
  Administered 2014-10-30 (×2): 4 mg via INTRAVENOUS
  Administered 2014-10-30: 2 mg via INTRAVENOUS
  Administered 2014-10-30: 4 mg via INTRAVENOUS
  Administered 2014-10-30 – 2014-11-01 (×2): 2 mg via INTRAVENOUS
  Administered 2014-11-02: 4 mg via INTRAVENOUS
  Filled 2014-10-29: qty 2
  Filled 2014-10-29 (×5): qty 1
  Filled 2014-10-29: qty 2
  Filled 2014-10-29 (×2): qty 1
  Filled 2014-10-29: qty 2

## 2014-10-29 MED ORDER — LACTATED RINGERS IV SOLN
INTRAVENOUS | Status: DC | PRN
  Administered 2014-10-29 (×2): via INTRAVENOUS

## 2014-10-29 MED ORDER — MIDAZOLAM HCL 2 MG/2ML IJ SOLN
INTRAMUSCULAR | Status: AC
  Filled 2014-10-29: qty 4

## 2014-10-29 MED ORDER — HEPARIN SODIUM (PORCINE) 1000 UNIT/ML IJ SOLN
INTRAMUSCULAR | Status: AC
  Filled 2014-10-29: qty 1

## 2014-10-29 MED ORDER — THROMBIN 20000 UNITS EX SOLR
CUTANEOUS | Status: AC
  Filled 2014-10-29: qty 20000

## 2014-10-29 MED ORDER — ROCURONIUM BROMIDE 50 MG/5ML IV SOLN
INTRAVENOUS | Status: AC
  Filled 2014-10-29: qty 2

## 2014-10-29 MED ORDER — MAGNESIUM SULFATE 4 GM/100ML IV SOLN
4.0000 g | Freq: Once | INTRAVENOUS | Status: AC
  Administered 2014-10-29: 4 g via INTRAVENOUS
  Filled 2014-10-29: qty 100

## 2014-10-29 MED ORDER — 0.9 % SODIUM CHLORIDE (POUR BTL) OPTIME
TOPICAL | Status: DC | PRN
  Administered 2014-10-29 (×2): 1000 mL

## 2014-10-29 MED ORDER — SODIUM CHLORIDE 0.9 % IV SOLN
Freq: Once | INTRAVENOUS | Status: AC
  Administered 2014-10-29: 19:00:00 via INTRAVENOUS

## 2014-10-29 MED ORDER — CALCIUM CHLORIDE 10 % IV SOLN
INTRAVENOUS | Status: DC | PRN
  Administered 2014-10-29: 200 mg via INTRAVENOUS
  Administered 2014-10-29 (×2): 300 mg via INTRAVENOUS
  Administered 2014-10-29: 200 mg via INTRAVENOUS
  Administered 2014-10-29: 300 mg via INTRAVENOUS

## 2014-10-29 MED ORDER — MIDAZOLAM HCL 5 MG/5ML IJ SOLN
INTRAMUSCULAR | Status: DC | PRN
  Administered 2014-10-29 (×2): 2 mg via INTRAVENOUS
  Administered 2014-10-29: 3 mg via INTRAVENOUS

## 2014-10-29 MED ORDER — SODIUM CHLORIDE 0.9 % IV SOLN
250.0000 [IU] | INTRAVENOUS | Status: DC | PRN
  Administered 2014-10-29: 1.8 [IU]/h via INTRAVENOUS

## 2014-10-29 MED ORDER — METOPROLOL TARTRATE 25 MG/10 ML ORAL SUSPENSION
12.5000 mg | Freq: Two times a day (BID) | ORAL | Status: DC
Start: 2014-10-29 — End: 2014-11-04
  Filled 2014-10-29 (×13): qty 5

## 2014-10-29 MED ORDER — NITROGLYCERIN 0.2 MG/ML ON CALL CATH LAB
INTRAVENOUS | Status: DC | PRN
  Administered 2014-10-29 (×4): 40 ug via INTRAVENOUS
  Administered 2014-10-29: 80 ug via INTRAVENOUS
  Administered 2014-10-29: 20 ug via INTRAVENOUS

## 2014-10-29 MED ORDER — ALBUMIN HUMAN 5 % IV SOLN
250.0000 mL | INTRAVENOUS | Status: AC | PRN
  Administered 2014-10-29 (×2): 250 mL via INTRAVENOUS

## 2014-10-29 MED ORDER — ACETAMINOPHEN 160 MG/5ML PO SOLN: 650.0000 mg | Freq: Once | ORAL | Status: AC

## 2014-10-29 MED ORDER — PROTAMINE SULFATE 10 MG/ML IV SOLN
INTRAVENOUS | Status: AC
  Filled 2014-10-29: qty 25

## 2014-10-29 MED ORDER — SUCCINYLCHOLINE CHLORIDE 20 MG/ML IJ SOLN
INTRAMUSCULAR | Status: AC
  Filled 2014-10-29: qty 1

## 2014-10-29 MED ORDER — MORPHINE SULFATE (PF) 2 MG/ML IV SOLN
1.0000 mg | INTRAVENOUS | Status: AC | PRN
  Administered 2014-10-29: 2 mg via INTRAVENOUS
  Filled 2014-10-29: qty 2

## 2014-10-29 MED ORDER — SODIUM CHLORIDE 0.45 % IV SOLN: INTRAVENOUS | Status: DC | PRN

## 2014-10-29 MED ORDER — LACTATED RINGERS IV SOLN: INTRAVENOUS | Status: DC

## 2014-10-29 MED ORDER — ACETAMINOPHEN 160 MG/5ML PO SOLN
1000.0000 mg | Freq: Four times a day (QID) | ORAL | Status: AC
  Administered 2014-10-30 (×2): 1000 mg

## 2014-10-29 MED ORDER — SODIUM CHLORIDE 0.9 % IJ SOLN: 3.0000 mL | INTRAMUSCULAR | Status: DC | PRN

## 2014-10-29 MED ORDER — LACTATED RINGERS IV SOLN
INTRAVENOUS | Status: DC | PRN
  Administered 2014-10-29 (×2): via INTRAVENOUS

## 2014-10-29 MED ORDER — HEMOSTATIC AGENTS (NO CHARGE) OPTIME
TOPICAL | Status: DC | PRN
  Administered 2014-10-29 (×4): 1 via TOPICAL

## 2014-10-29 MED ORDER — 0.9 % SODIUM CHLORIDE (POUR BTL) OPTIME
TOPICAL | Status: DC | PRN
  Administered 2014-10-29: 6000 mL

## 2014-10-29 MED ORDER — LACTATED RINGERS IV SOLN
INTRAVENOUS | Status: DC | PRN
  Administered 2014-10-29: 08:00:00 via INTRAVENOUS

## 2014-10-29 MED ORDER — FENTANYL CITRATE (PF) 100 MCG/2ML IJ SOLN
INTRAMUSCULAR | Status: DC | PRN
  Administered 2014-10-29: 100 ug via INTRAVENOUS
  Administered 2014-10-29: 50 ug via INTRAVENOUS
  Administered 2014-10-29: 100 ug via INTRAVENOUS

## 2014-10-29 MED ORDER — PROTAMINE SULFATE 10 MG/ML IV SOLN
INTRAVENOUS | Status: AC
  Filled 2014-10-29: qty 5

## 2014-10-29 MED ORDER — INSULIN REGULAR BOLUS VIA INFUSION
0.0000 [IU] | Freq: Three times a day (TID) | INTRAVENOUS | Status: DC
  Filled 2014-10-29: qty 10

## 2014-10-29 MED ORDER — VECURONIUM BROMIDE 10 MG IV SOLR
INTRAVENOUS | Status: DC | PRN
  Administered 2014-10-29: 10 mg via INTRAVENOUS

## 2014-10-29 MED ORDER — MIDAZOLAM HCL 10 MG/2ML IJ SOLN
INTRAMUSCULAR | Status: AC
  Filled 2014-10-29: qty 4

## 2014-10-29 MED ORDER — VANCOMYCIN HCL IN DEXTROSE 1-5 GM/200ML-% IV SOLN
1000.0000 mg | Freq: Once | INTRAVENOUS | Status: AC
  Administered 2014-10-30: 1000 mg via INTRAVENOUS
  Filled 2014-10-29: qty 200

## 2014-10-29 MED ORDER — PANTOPRAZOLE SODIUM 40 MG PO TBEC
40.0000 mg | DELAYED_RELEASE_TABLET | Freq: Every day | ORAL | Status: DC
  Administered 2014-10-31 – 2014-11-10 (×10): 40 mg via ORAL
  Filled 2014-10-29 (×11): qty 1

## 2014-10-29 MED ORDER — LIDOCAINE HCL (CARDIAC) 20 MG/ML IV SOLN
INTRAVENOUS | Status: AC
  Filled 2014-10-29: qty 5

## 2014-10-29 MED ORDER — DEXTROSE 5 % IV SOLN
0.0000 ug/min | INTRAVENOUS | Status: DC
  Administered 2014-10-29: 20 ug/min via INTRAVENOUS
  Filled 2014-10-29 (×2): qty 2

## 2014-10-29 MED ORDER — DEXTROSE 5 % IV SOLN
10.0000 mg | INTRAVENOUS | Status: DC | PRN
  Administered 2014-10-29: 50 ug/min via INTRAVENOUS

## 2014-10-29 MED ORDER — ACETAMINOPHEN 650 MG RE SUPP
650.0000 mg | Freq: Once | RECTAL | Status: AC
  Administered 2014-10-29: 650 mg via RECTAL

## 2014-10-29 MED ORDER — METOPROLOL TARTRATE 12.5 MG HALF TABLET
12.5000 mg | ORAL_TABLET | Freq: Two times a day (BID) | ORAL | Status: DC
  Administered 2014-10-30 – 2014-11-04 (×8): 12.5 mg via ORAL
  Filled 2014-10-29 (×15): qty 1

## 2014-10-29 MED ORDER — HEPARIN SODIUM (PORCINE) 1000 UNIT/ML IJ SOLN
INTRAMUSCULAR | Status: DC | PRN
  Administered 2014-10-29: 23000 [IU] via INTRAVENOUS

## 2014-10-29 MED ORDER — METOPROLOL TARTRATE 12.5 MG HALF TABLET: 12.5000 mg | ORAL_TABLET | Freq: Once | ORAL | Status: DC

## 2014-10-29 MED ORDER — CHLORHEXIDINE GLUCONATE 0.12 % MT SOLN
15.0000 mL | Freq: Two times a day (BID) | OROMUCOSAL | Status: DC
  Administered 2014-10-30 (×3): 15 mL via OROMUCOSAL
  Filled 2014-10-29 (×2): qty 15

## 2014-10-29 MED ORDER — SODIUM CHLORIDE 0.9 % IV SOLN: 10.0000 mL/h | Freq: Once | INTRAVENOUS | Status: AC

## 2014-10-29 MED ORDER — BISACODYL 10 MG RE SUPP
10.0000 mg | Freq: Every day | RECTAL | Status: DC
  Filled 2014-10-29: qty 1

## 2014-10-29 MED ORDER — METOPROLOL TARTRATE 1 MG/ML IV SOLN
2.5000 mg | INTRAVENOUS | Status: DC | PRN
  Administered 2014-10-30: 2.5 mg via INTRAVENOUS
  Administered 2014-10-31 – 2014-11-02 (×6): 5 mg via INTRAVENOUS
  Filled 2014-10-29 (×5): qty 5

## 2014-10-29 MED ORDER — DOPAMINE-DEXTROSE 3.2-5 MG/ML-% IV SOLN
3.0000 ug/kg/min | INTRAVENOUS | Status: DC
  Administered 2014-10-31: 3 ug/kg/min via INTRAVENOUS
  Filled 2014-10-29: qty 250

## 2014-10-29 MED ORDER — ASPIRIN 81 MG PO CHEW: 324.0000 mg | CHEWABLE_TABLET | Freq: Every day | ORAL | Status: DC

## 2014-10-29 MED ORDER — CHLORHEXIDINE GLUCONATE 4 % EX LIQD: 30.0000 mL | CUTANEOUS | Status: DC

## 2014-10-29 MED ORDER — SODIUM CHLORIDE 0.9 % IJ SOLN
3.0000 mL | Freq: Two times a day (BID) | INTRAMUSCULAR | Status: DC
  Administered 2014-10-30 – 2014-11-03 (×8): 3 mL via INTRAVENOUS

## 2014-10-29 MED ORDER — SODIUM CHLORIDE 0.9 % IV SOLN
INTRAVENOUS | Status: DC
  Administered 2014-10-31 – 2014-11-04 (×2): via INTRAVENOUS

## 2014-10-29 MED ORDER — PROPOFOL 10 MG/ML IV BOLUS
INTRAVENOUS | Status: DC | PRN
  Administered 2014-10-29: 20 mg via INTRAVENOUS
  Administered 2014-10-29: 30 mg via INTRAVENOUS

## 2014-10-29 MED ORDER — EPHEDRINE SULFATE 50 MG/ML IJ SOLN
INTRAMUSCULAR | Status: AC
  Filled 2014-10-29: qty 1

## 2014-10-29 MED ORDER — FENTANYL CITRATE (PF) 100 MCG/2ML IJ SOLN
INTRAMUSCULAR | Status: DC | PRN
  Administered 2014-10-29: 200 ug via INTRAVENOUS
  Administered 2014-10-29: 50 ug via INTRAVENOUS
  Administered 2014-10-29 (×3): 100 ug via INTRAVENOUS
  Administered 2014-10-29 (×3): 200 ug via INTRAVENOUS
  Administered 2014-10-29: 100 ug via INTRAVENOUS

## 2014-10-29 MED ORDER — SODIUM CHLORIDE 0.9 % IV SOLN
Freq: Once | INTRAVENOUS | Status: AC
  Administered 2014-10-29: 250 mL via INTRAVENOUS

## 2014-10-29 MED ORDER — DOPAMINE-DEXTROSE 3.2-5 MG/ML-% IV SOLN
INTRAVENOUS | Status: DC | PRN
  Administered 2014-10-29: 3 ug/kg/min via INTRAVENOUS

## 2014-10-29 MED ORDER — SODIUM CHLORIDE 0.9 % IV SOLN
Freq: Once | INTRAVENOUS | Status: AC
  Administered 2014-10-29: 21:00:00 via INTRAVENOUS

## 2014-10-29 MED ORDER — CALCIUM CHLORIDE 10 % IV SOLN
INTRAVENOUS | Status: AC
  Filled 2014-10-29: qty 10

## 2014-10-29 MED ORDER — PRAVASTATIN SODIUM 40 MG PO TABS
80.0000 mg | ORAL_TABLET | Freq: Every day | ORAL | Status: DC
  Administered 2014-10-30 – 2014-11-09 (×9): 80 mg via ORAL
  Filled 2014-10-29 (×3): qty 1
  Filled 2014-10-29: qty 2
  Filled 2014-10-29: qty 1
  Filled 2014-10-29: qty 2
  Filled 2014-10-29 (×5): qty 1
  Filled 2014-10-29 (×2): qty 2

## 2014-10-29 MED ORDER — TRAMADOL HCL 50 MG PO TABS: 50.0000 mg | ORAL_TABLET | ORAL | Status: DC | PRN

## 2014-10-29 MED ORDER — SODIUM CHLORIDE 0.9 % IV SOLN: 10.0000 mL/h | Freq: Once | INTRAVENOUS | Status: DC

## 2014-10-29 MED ORDER — THROMBIN 20000 UNITS EX SOLR
CUTANEOUS | Status: DC | PRN
Start: 2014-10-29 — End: 2014-10-29
  Administered 2014-10-29: 20000 [IU] via TOPICAL

## 2014-10-29 MED ORDER — SODIUM CHLORIDE 0.9 % IV SOLN
INTRAVENOUS | Status: DC
  Administered 2014-10-29: 1.9 [IU]/h via INTRAVENOUS
  Filled 2014-10-29 (×2): qty 2.5

## 2014-10-29 MED ORDER — MUPIROCIN 2 % EX OINT
1.0000 "application " | TOPICAL_OINTMENT | Freq: Two times a day (BID) | CUTANEOUS | Status: AC
  Administered 2014-10-30 – 2014-11-03 (×10): 1 via NASAL
  Filled 2014-10-29: qty 22

## 2014-10-29 MED ORDER — DOCUSATE SODIUM 100 MG PO CAPS
200.0000 mg | ORAL_CAPSULE | Freq: Every day | ORAL | Status: DC
  Administered 2014-10-31 – 2014-11-02 (×2): 200 mg via ORAL
  Filled 2014-10-29 (×3): qty 2

## 2014-10-29 MED ORDER — NITROGLYCERIN IN D5W 200-5 MCG/ML-% IV SOLN: 0.0000 ug/min | INTRAVENOUS | Status: DC

## 2014-10-29 MED ORDER — PROPOFOL 10 MG/ML IV BOLUS
INTRAVENOUS | Status: AC
  Filled 2014-10-29: qty 20

## 2014-10-29 MED ORDER — CHLORHEXIDINE GLUCONATE CLOTH 2 % EX PADS
6.0000 | MEDICATED_PAD | Freq: Every day | CUTANEOUS | Status: AC
Start: 2014-10-30 — End: 2014-11-03
  Administered 2014-10-30 – 2014-11-03 (×5): 6 via TOPICAL

## 2014-10-29 MED ORDER — LACTATED RINGERS IV SOLN
INTRAVENOUS | Status: DC
  Administered 2014-10-30: 20 mL/h via INTRAVENOUS

## 2014-10-29 MED ORDER — SODIUM CHLORIDE 0.9 % IJ SOLN
INTRAMUSCULAR | Status: AC
  Filled 2014-10-29: qty 10

## 2014-10-29 MED ORDER — BISACODYL 5 MG PO TBEC
10.0000 mg | DELAYED_RELEASE_TABLET | Freq: Every day | ORAL | Status: DC
  Administered 2014-10-31 – 2014-11-02 (×2): 10 mg via ORAL
  Filled 2014-10-29 (×3): qty 2

## 2014-10-29 MED ORDER — SODIUM CHLORIDE 0.9 % IV SOLN
Freq: Once | INTRAVENOUS | Status: AC
  Administered 2014-10-29: 17:00:00 via INTRAVENOUS

## 2014-10-29 MED ORDER — DEXMEDETOMIDINE HCL IN NACL 200 MCG/50ML IV SOLN
0.0000 ug/kg/h | INTRAVENOUS | Status: DC
  Administered 2014-10-29: 0.7 ug/kg/h via INTRAVENOUS
  Administered 2014-10-30: 0.6 ug/kg/h via INTRAVENOUS
  Administered 2014-11-02: 0.2 ug/kg/h via INTRAVENOUS
  Administered 2014-11-02: 0.1 ug/kg/h via INTRAVENOUS
  Filled 2014-10-29 (×5): qty 50

## 2014-10-29 MED ORDER — MIDAZOLAM HCL 5 MG/5ML IJ SOLN
INTRAMUSCULAR | Status: DC | PRN
  Administered 2014-10-29 (×2): 2 mg via INTRAVENOUS

## 2014-10-29 MED ORDER — FAMOTIDINE IN NACL 20-0.9 MG/50ML-% IV SOLN
20.0000 mg | Freq: Two times a day (BID) | INTRAVENOUS | Status: AC
  Administered 2014-10-29 – 2014-10-30 (×2): 20 mg via INTRAVENOUS
  Filled 2014-10-29: qty 50

## 2014-10-29 MED ORDER — ROCURONIUM BROMIDE 100 MG/10ML IV SOLN
INTRAVENOUS | Status: DC | PRN
  Administered 2014-10-29: 30 mg via INTRAVENOUS
  Administered 2014-10-29: 70 mg via INTRAVENOUS
  Administered 2014-10-29: 30 mg via INTRAVENOUS
  Administered 2014-10-29 (×2): 50 mg via INTRAVENOUS

## 2014-10-29 MED ORDER — SODIUM CHLORIDE 0.9 % IV SOLN
250.0000 mL | INTRAVENOUS | Status: DC
  Administered 2014-10-30: 250 mL via INTRAVENOUS

## 2014-10-29 MED ORDER — DEXTROSE 5 % IV SOLN
1.5000 g | Freq: Two times a day (BID) | INTRAVENOUS | Status: AC
  Administered 2014-10-29 – 2014-10-31 (×4): 1.5 g via INTRAVENOUS
  Filled 2014-10-29 (×4): qty 1.5

## 2014-10-29 MED ORDER — PROTAMINE SULFATE 10 MG/ML IV SOLN
INTRAVENOUS | Status: DC | PRN
  Administered 2014-10-29: 10 mg via INTRAVENOUS
  Administered 2014-10-29: 210 mg via INTRAVENOUS

## 2014-10-29 MED ORDER — SODIUM BICARBONATE 8.4 % IV SOLN
50.0000 meq | Freq: Once | INTRAVENOUS | Status: AC
  Administered 2014-10-29: 50 meq via INTRAVENOUS

## 2014-10-29 MED ORDER — ACETAMINOPHEN 500 MG PO TABS
1000.0000 mg | ORAL_TABLET | Freq: Four times a day (QID) | ORAL | Status: AC
  Administered 2014-10-30 – 2014-11-03 (×9): 1000 mg via ORAL
  Filled 2014-10-29 (×19): qty 2

## 2014-10-29 MED ORDER — PROTAMINE SULFATE 10 MG/ML IV SOLN
25.0000 mg | Freq: Once | INTRAVENOUS | Status: AC
  Administered 2014-10-29: 25 mg via INTRAVENOUS

## 2014-10-29 MED FILL — Potassium Chloride Inj 2 mEq/ML: INTRAVENOUS | Qty: 40 | Status: AC

## 2014-10-29 MED FILL — Heparin Sodium (Porcine) Inj 1000 Unit/ML: INTRAMUSCULAR | Qty: 30 | Status: AC

## 2014-10-29 MED FILL — Heparin Sodium (Porcine) Inj 1000 Unit/ML: INTRAMUSCULAR | Qty: 10 | Status: AC

## 2014-10-29 MED FILL — Sodium Bicarbonate IV Soln 8.4%: INTRAVENOUS | Qty: 50 | Status: AC

## 2014-10-29 MED FILL — Electrolyte-R (PH 7.4) Solution: INTRAVENOUS | Qty: 3000 | Status: AC

## 2014-10-29 MED FILL — Lidocaine HCl IV Inj 20 MG/ML: INTRAVENOUS | Qty: 5 | Status: AC

## 2014-10-29 MED FILL — Magnesium Sulfate Inj 50%: INTRAMUSCULAR | Qty: 10 | Status: AC

## 2014-10-29 MED FILL — Sodium Chloride IV Soln 0.9%: INTRAVENOUS | Qty: 2000 | Status: AC

## 2014-10-29 MED FILL — Mannitol IV Soln 20%: INTRAVENOUS | Qty: 500 | Status: AC

## 2014-10-29 SURGICAL SUPPLY — 114 items
ADAPTER CARDIO PERF ANTE/RETRO (ADAPTER) ×4 IMPLANT
BAG DECANTER FOR FLEXI CONT (MISCELLANEOUS) ×4 IMPLANT
BANDAGE ELASTIC 4 VELCRO ST LF (GAUZE/BANDAGES/DRESSINGS) ×4 IMPLANT
BANDAGE ELASTIC 6 VELCRO ST LF (GAUZE/BANDAGES/DRESSINGS) ×4 IMPLANT
BASKET HEART  (ORDER IN 25'S) (MISCELLANEOUS) ×1
BASKET HEART (ORDER IN 25'S) (MISCELLANEOUS) ×1
BASKET HEART (ORDER IN 25S) (MISCELLANEOUS) ×2 IMPLANT
BLADE STERNUM SYSTEM 6 (BLADE) ×4 IMPLANT
BLADE SURG 11 STRL SS (BLADE) ×4 IMPLANT
BLADE SURG 15 STRL LF DISP TIS (BLADE) ×2 IMPLANT
BLADE SURG 15 STRL SS (BLADE) ×2
BNDG GAUZE ELAST 4 BULKY (GAUZE/BANDAGES/DRESSINGS) ×4 IMPLANT
CANISTER SUCTION 2500CC (MISCELLANEOUS) ×4 IMPLANT
CANNULA GUNDRY RCSP 15FR (MISCELLANEOUS) ×4 IMPLANT
CATH HEART VENT LEFT (CATHETERS) ×2 IMPLANT
CATH ROBINSON RED A/P 18FR (CATHETERS) ×12 IMPLANT
CATH THORACIC 28FR (CATHETERS) ×4 IMPLANT
CATH THORACIC 36FR (CATHETERS) ×4 IMPLANT
CATH THORACIC 36FR RT ANG (CATHETERS) ×4 IMPLANT
CLIP TI MEDIUM 24 (CLIP) ×12 IMPLANT
CLIP TI WIDE RED SMALL 24 (CLIP) ×16 IMPLANT
CONT SPEC STER OR (MISCELLANEOUS) ×4 IMPLANT
COVER SURGICAL LIGHT HANDLE (MISCELLANEOUS) ×4 IMPLANT
CRADLE DONUT ADULT HEAD (MISCELLANEOUS) ×4 IMPLANT
DERMABOND ADVANCED (GAUZE/BANDAGES/DRESSINGS) ×2
DERMABOND ADVANCED .7 DNX12 (GAUZE/BANDAGES/DRESSINGS) ×2 IMPLANT
DRAPE CARDIOVASCULAR INCISE (DRAPES) ×2
DRAPE SLUSH/WARMER DISC (DRAPES) ×4 IMPLANT
DRAPE SRG 135X102X78XABS (DRAPES) ×2 IMPLANT
DRSG COVADERM 4X14 (GAUZE/BANDAGES/DRESSINGS) ×4 IMPLANT
ELECT BLADE 4.0 EZ CLEAN MEGAD (MISCELLANEOUS) ×8
ELECT CAUTERY BLADE 6.4 (BLADE) ×4 IMPLANT
ELECT REM PT RETURN 9FT ADLT (ELECTROSURGICAL) ×8
ELECTRODE BLDE 4.0 EZ CLN MEGD (MISCELLANEOUS) ×4 IMPLANT
ELECTRODE REM PT RTRN 9FT ADLT (ELECTROSURGICAL) ×4 IMPLANT
GAUZE SPONGE 4X4 12PLY STRL (GAUZE/BANDAGES/DRESSINGS) ×8 IMPLANT
GLOVE BIO SURGEON STRL SZ 6 (GLOVE) IMPLANT
GLOVE BIO SURGEON STRL SZ 6.5 (GLOVE) ×9 IMPLANT
GLOVE BIO SURGEON STRL SZ7 (GLOVE) ×16 IMPLANT
GLOVE BIO SURGEON STRL SZ7.5 (GLOVE) IMPLANT
GLOVE BIO SURGEONS STRL SZ 6.5 (GLOVE) ×3
GLOVE BIOGEL PI IND STRL 6 (GLOVE) ×8 IMPLANT
GLOVE BIOGEL PI IND STRL 6.5 (GLOVE) ×4 IMPLANT
GLOVE BIOGEL PI IND STRL 7.0 (GLOVE) ×8 IMPLANT
GLOVE BIOGEL PI INDICATOR 6 (GLOVE) ×8
GLOVE BIOGEL PI INDICATOR 6.5 (GLOVE) ×4
GLOVE BIOGEL PI INDICATOR 7.0 (GLOVE) ×8
GLOVE EUDERMIC 7 POWDERFREE (GLOVE) ×8 IMPLANT
GLOVE ORTHO TXT STRL SZ7.5 (GLOVE) IMPLANT
GOWN STRL REUS W/ TWL LRG LVL3 (GOWN DISPOSABLE) ×16 IMPLANT
GOWN STRL REUS W/ TWL XL LVL3 (GOWN DISPOSABLE) ×2 IMPLANT
GOWN STRL REUS W/TWL LRG LVL3 (GOWN DISPOSABLE) ×16
GOWN STRL REUS W/TWL XL LVL3 (GOWN DISPOSABLE) ×2
HEART VENT LT CURVED (MISCELLANEOUS) ×4 IMPLANT
HEMOSTAT POWDER SURGIFOAM 1G (HEMOSTASIS) ×12 IMPLANT
HEMOSTAT SURGICEL 2X14 (HEMOSTASIS) ×4 IMPLANT
INSERT FOGARTY 61MM (MISCELLANEOUS) ×4 IMPLANT
KIT BASIN OR (CUSTOM PROCEDURE TRAY) ×4 IMPLANT
KIT CATH CPB BARTLE (MISCELLANEOUS) ×4 IMPLANT
KIT CATH SUCT 8FR (CATHETERS) ×4 IMPLANT
KIT ROOM TURNOVER OR (KITS) ×4 IMPLANT
KIT SUCTION CATH 14FR (SUCTIONS) ×4 IMPLANT
KIT VASOVIEW W/TROCAR VH 2000 (KITS) ×4 IMPLANT
LINE VENT (MISCELLANEOUS) ×4 IMPLANT
NS IRRIG 1000ML POUR BTL (IV SOLUTION) ×24 IMPLANT
PACK OPEN HEART (CUSTOM PROCEDURE TRAY) ×4 IMPLANT
PAD ARMBOARD 7.5X6 YLW CONV (MISCELLANEOUS) ×8 IMPLANT
PAD ELECT DEFIB RADIOL ZOLL (MISCELLANEOUS) ×4 IMPLANT
PENCIL BUTTON BLDE SNGL 10FT (ELECTRODE) ×4 IMPLANT
PENCIL BUTTON HOLSTER BLD 10FT (ELECTRODE) ×4 IMPLANT
PUNCH AORTIC ROTATE 4.5MM 8IN (MISCELLANEOUS) ×4 IMPLANT
SET CARDIOPLEGIA MPS 5001102 (MISCELLANEOUS) ×4 IMPLANT
SPONGE INTESTINAL PEANUT (DISPOSABLE) ×4 IMPLANT
SPONGE LAP 18X18 X RAY DECT (DISPOSABLE) ×28 IMPLANT
SPONGE LAP 4X18 X RAY DECT (DISPOSABLE) ×8 IMPLANT
SUT BONE WAX W31G (SUTURE) ×4 IMPLANT
SUT ETHIBON 2 0 V 52N 30 (SUTURE) ×8 IMPLANT
SUT ETHIBOND 2 0 SH (SUTURE) ×10
SUT ETHIBOND 2 0 SH 36X2 (SUTURE) ×10 IMPLANT
SUT MNCRL AB 4-0 PS2 18 (SUTURE) ×8 IMPLANT
SUT PROLENE 3 0 SH1 36 (SUTURE) ×8 IMPLANT
SUT PROLENE 4 0 RB 1 (SUTURE) ×6
SUT PROLENE 4-0 RB1 .5 CRCL 36 (SUTURE) ×6 IMPLANT
SUT PROLENE 5 0 C 1 36 (SUTURE) ×8 IMPLANT
SUT PROLENE 6 0 C 1 30 (SUTURE) ×12 IMPLANT
SUT PROLENE 7 0 BV1 MDA (SUTURE) ×8 IMPLANT
SUT PROLENE 8 0 BV175 6 (SUTURE) ×8 IMPLANT
SUT SILK  1 MH (SUTURE) ×6
SUT SILK 1 MH (SUTURE) ×6 IMPLANT
SUT SILK 1 TIES 10X30 (SUTURE) ×4 IMPLANT
SUT SILK 2 0 SH CR/8 (SUTURE) ×8 IMPLANT
SUT SILK 3 0 SH CR/8 (SUTURE) ×4 IMPLANT
SUT SILK 4 0 TIE 10X30 (SUTURE) ×8 IMPLANT
SUT STEEL SZ 6 DBL 3X14 BALL (SUTURE) ×12 IMPLANT
SUT TEM PAC WIRE 2 0 SH (SUTURE) ×16 IMPLANT
SUT VIC AB 1 CTX 36 (SUTURE) ×10
SUT VIC AB 1 CTX36XBRD ANBCTR (SUTURE) ×10 IMPLANT
SUT VIC AB 2-0 CT1 27 (SUTURE) ×2
SUT VIC AB 2-0 CT1 TAPERPNT 27 (SUTURE) ×2 IMPLANT
SUT VIC AB 2-0 CTX 27 (SUTURE) ×12 IMPLANT
SUT VIC AB 3-0 X1 27 (SUTURE) ×8 IMPLANT
SYSTEM SAHARA CHEST DRAIN ATS (WOUND CARE) ×4 IMPLANT
TAPE CLOTH SURG 4X10 WHT LF (GAUZE/BANDAGES/DRESSINGS) ×4 IMPLANT
TAPE CLOTH SURG 6X10 WHT LF (GAUZE/BANDAGES/DRESSINGS) ×4 IMPLANT
TAPE PAPER 2X10 WHT MICROPORE (GAUZE/BANDAGES/DRESSINGS) ×4 IMPLANT
TOWEL OR 17X24 6PK STRL BLUE (TOWEL DISPOSABLE) ×8 IMPLANT
TOWEL OR 17X26 10 PK STRL BLUE (TOWEL DISPOSABLE) ×8 IMPLANT
TRAY FOLEY IC TEMP SENS 16FR (CATHETERS) ×4 IMPLANT
TUBING INSUFFLATION (TUBING) ×4 IMPLANT
UNDERPAD 30X30 INCONTINENT (UNDERPADS AND DIAPERS) ×4 IMPLANT
VALVE MAGNA EASE AORTIC 25MM (Prosthesis & Implant Heart) ×4 IMPLANT
VENT LEFT HEART 12002 (CATHETERS) ×4
WATER STERILE IRR 1000ML POUR (IV SOLUTION) ×8 IMPLANT
YANKAUER SUCT BULB TIP NO VENT (SUCTIONS) ×8 IMPLANT

## 2014-10-29 SURGICAL SUPPLY — 97 items
BAG DECANTER FOR FLEXI CONT (MISCELLANEOUS) IMPLANT
BANDAGE ELASTIC 4 VELCRO ST LF (GAUZE/BANDAGES/DRESSINGS) IMPLANT
BANDAGE ELASTIC 6 VELCRO ST LF (GAUZE/BANDAGES/DRESSINGS) IMPLANT
BASKET HEART  (ORDER IN 25'S) (MISCELLANEOUS)
BASKET HEART (ORDER IN 25'S) (MISCELLANEOUS)
BASKET HEART (ORDER IN 25S) (MISCELLANEOUS) IMPLANT
BLADE STERNUM SYSTEM 6 (BLADE) IMPLANT
BNDG GAUZE ELAST 4 BULKY (GAUZE/BANDAGES/DRESSINGS) IMPLANT
CANISTER SUCTION 2500CC (MISCELLANEOUS) ×3 IMPLANT
CATH ROBINSON RED A/P 18FR (CATHETERS) IMPLANT
CATH THORACIC 28FR (CATHETERS) IMPLANT
CATH THORACIC 36FR (CATHETERS) IMPLANT
CATH THORACIC 36FR RT ANG (CATHETERS) ×3 IMPLANT
CLIP TI MEDIUM 24 (CLIP) IMPLANT
CLIP TI WIDE RED SMALL 24 (CLIP) IMPLANT
COVER SURGICAL LIGHT HANDLE (MISCELLANEOUS) ×3 IMPLANT
CRADLE DONUT ADULT HEAD (MISCELLANEOUS) ×3 IMPLANT
DRAPE CARDIOVASCULAR INCISE (DRAPES) ×2
DRAPE SLUSH/WARMER DISC (DRAPES) ×3 IMPLANT
DRAPE SRG 135X102X78XABS (DRAPES) ×1 IMPLANT
DRSG COVADERM 4X14 (GAUZE/BANDAGES/DRESSINGS) ×3 IMPLANT
ELECT CAUTERY BLADE 6.4 (BLADE) ×3 IMPLANT
ELECT REM PT RETURN 9FT ADLT (ELECTROSURGICAL) ×6
ELECTRODE REM PT RTRN 9FT ADLT (ELECTROSURGICAL) ×2 IMPLANT
GAUZE SPONGE 4X4 12PLY STRL (GAUZE/BANDAGES/DRESSINGS) ×6 IMPLANT
GLOVE BIO SURGEON STRL SZ 6 (GLOVE) ×6 IMPLANT
GLOVE BIO SURGEON STRL SZ 6.5 (GLOVE) ×6 IMPLANT
GLOVE BIO SURGEON STRL SZ7 (GLOVE) IMPLANT
GLOVE BIO SURGEON STRL SZ7.5 (GLOVE) IMPLANT
GLOVE BIO SURGEONS STRL SZ 6.5 (GLOVE) ×3
GLOVE BIOGEL PI IND STRL 6 (GLOVE) IMPLANT
GLOVE BIOGEL PI IND STRL 6.5 (GLOVE) ×2 IMPLANT
GLOVE BIOGEL PI IND STRL 7.0 (GLOVE) IMPLANT
GLOVE BIOGEL PI INDICATOR 6 (GLOVE)
GLOVE BIOGEL PI INDICATOR 6.5 (GLOVE) ×4
GLOVE BIOGEL PI INDICATOR 7.0 (GLOVE)
GLOVE EUDERMIC 7 POWDERFREE (GLOVE) ×6 IMPLANT
GLOVE ORTHO TXT STRL SZ7.5 (GLOVE) IMPLANT
GOWN STRL REUS W/ TWL LRG LVL3 (GOWN DISPOSABLE) ×3 IMPLANT
GOWN STRL REUS W/ TWL XL LVL3 (GOWN DISPOSABLE) IMPLANT
GOWN STRL REUS W/TWL LRG LVL3 (GOWN DISPOSABLE) ×6
GOWN STRL REUS W/TWL XL LVL3 (GOWN DISPOSABLE)
HEMOSTAT POWDER SURGIFOAM 1G (HEMOSTASIS) ×3 IMPLANT
HEMOSTAT SURGICEL 2X14 (HEMOSTASIS) ×3 IMPLANT
INSERT FOGARTY 61MM (MISCELLANEOUS) IMPLANT
INSERT FOGARTY XLG (MISCELLANEOUS) IMPLANT
KIT BASIN OR (CUSTOM PROCEDURE TRAY) ×3 IMPLANT
KIT CATH CPB BARTLE (MISCELLANEOUS) ×3 IMPLANT
KIT ROOM TURNOVER OR (KITS) ×3 IMPLANT
KIT SUCTION CATH 14FR (SUCTIONS) ×3 IMPLANT
KIT VASOVIEW W/TROCAR VH 2000 (KITS) IMPLANT
NS IRRIG 1000ML POUR BTL (IV SOLUTION) ×15 IMPLANT
PACK OPEN HEART (CUSTOM PROCEDURE TRAY) ×3 IMPLANT
PAD ARMBOARD 7.5X6 YLW CONV (MISCELLANEOUS) ×6 IMPLANT
PAD ELECT DEFIB RADIOL ZOLL (MISCELLANEOUS) ×3 IMPLANT
PENCIL BUTTON HOLSTER BLD 10FT (ELECTRODE) ×3 IMPLANT
PUNCH AORTIC ROTATE 4.0MM (MISCELLANEOUS) IMPLANT
PUNCH AORTIC ROTATE 4.5MM 8IN (MISCELLANEOUS) IMPLANT
PUNCH AORTIC ROTATE 5MM 8IN (MISCELLANEOUS) IMPLANT
SPONGE GAUZE 4X4 12PLY STER LF (GAUZE/BANDAGES/DRESSINGS) ×3 IMPLANT
SPONGE INTESTINAL PEANUT (DISPOSABLE) IMPLANT
SPONGE LAP 18X18 X RAY DECT (DISPOSABLE) IMPLANT
SPONGE LAP 4X18 X RAY DECT (DISPOSABLE) ×3 IMPLANT
SUT BONE WAX W31G (SUTURE) ×3 IMPLANT
SUT MNCRL AB 4-0 PS2 18 (SUTURE) IMPLANT
SUT PROLENE 3 0 SH DA (SUTURE) IMPLANT
SUT PROLENE 3 0 SH1 36 (SUTURE) ×3 IMPLANT
SUT PROLENE 4 0 RB 1 (SUTURE) ×2
SUT PROLENE 4 0 SH DA (SUTURE) IMPLANT
SUT PROLENE 4-0 RB1 .5 CRCL 36 (SUTURE) ×1 IMPLANT
SUT PROLENE 5 0 C 1 36 (SUTURE) ×3 IMPLANT
SUT PROLENE 6 0 C 1 30 (SUTURE) ×3 IMPLANT
SUT PROLENE 7 0 BV 1 (SUTURE) IMPLANT
SUT PROLENE 7 0 BV1 MDA (SUTURE) ×3 IMPLANT
SUT PROLENE 8 0 BV175 6 (SUTURE) IMPLANT
SUT SILK  1 MH (SUTURE)
SUT SILK 1 MH (SUTURE) IMPLANT
SUT STEEL STERNAL CCS#1 18IN (SUTURE) IMPLANT
SUT STEEL SZ 6 DBL 3X14 BALL (SUTURE) ×9 IMPLANT
SUT VIC AB 1 CTX 36 (SUTURE) ×4
SUT VIC AB 1 CTX36XBRD ANBCTR (SUTURE) ×2 IMPLANT
SUT VIC AB 2-0 CT1 27 (SUTURE) ×4
SUT VIC AB 2-0 CT1 TAPERPNT 27 (SUTURE) ×2 IMPLANT
SUT VIC AB 2-0 CTX 27 (SUTURE) ×6 IMPLANT
SUT VIC AB 3-0 SH 27 (SUTURE)
SUT VIC AB 3-0 SH 27X BRD (SUTURE) IMPLANT
SUT VIC AB 3-0 X1 27 (SUTURE) ×6 IMPLANT
SUT VICRYL 4-0 PS2 18IN ABS (SUTURE) IMPLANT
SUTURE E-PAK OPEN HEART (SUTURE) ×3 IMPLANT
SYSTEM SAHARA CHEST DRAIN ATS (WOUND CARE) ×3 IMPLANT
TAPE CLOTH SURG 4X10 WHT LF (GAUZE/BANDAGES/DRESSINGS) ×3 IMPLANT
TOWEL OR 17X24 6PK STRL BLUE (TOWEL DISPOSABLE) ×3 IMPLANT
TOWEL OR 17X26 10 PK STRL BLUE (TOWEL DISPOSABLE) ×3 IMPLANT
TRAY FOLEY IC TEMP SENS 16FR (CATHETERS) IMPLANT
TUBING INSUFFLATION (TUBING) IMPLANT
UNDERPAD 30X30 INCONTINENT (UNDERPADS AND DIAPERS) ×3 IMPLANT
WATER STERILE IRR 1000ML POUR (IV SOLUTION) ×6 IMPLANT

## 2014-10-29 NOTE — Progress Notes (Signed)
Dr Dorris Fetch at bedside assessing pt and chest tube output, new orders received. Will continue to monitor pt.  Carlos Levering

## 2014-10-29 NOTE — OR Nursing (Signed)
RN contacted 2S Tyler Aas), providing patient update.

## 2014-10-29 NOTE — Progress Notes (Signed)
RT increased PEEP to 10 per MD verbal order.

## 2014-10-29 NOTE — Progress Notes (Signed)
Patient ID: Frank Marks, male   DOB: February 20, 1926, 81 y.o.   MRN: [id number]  CT Surgery  As noted by Dr. Dorris Fetch the patient has had significant bleeding off and on despite correction of coagulopathy. He remains hemodynamically stable but I think the best option is to return to the OR for exploration. I discussed with his son [name].

## 2014-10-29 NOTE — Progress Notes (Signed)
   10/29/14 1833  Vitals  Temp 97.5 F (36.4 C)  Pulse Rate 86  ECG Heart Rate 86  Resp 16  Oxygen Therapy  SpO2 100 %  Transfuse RBC  Blood Type of Blood Product O  Rh Factor of Blood Product Positive  Blood Admin Supplies Y set with filter  Art Line  Arterial Line BP 120/69 mmHg  CTD 150 in 25 minutes with clots. Peep increased to 8 and Dr. Dorris Fetch notified. Order received to also give 2 units FFP.

## 2014-10-29 NOTE — Anesthesia Preprocedure Evaluation (Signed)
Anesthesia Evaluation  Patient identified by MRN, date of birth, ID band Patient awake    Reviewed: Allergy & Precautions, NPO status , Patient's Chart, lab work & pertinent test results  History of Anesthesia Complications Negative for: history of anesthetic complications  Airway Mallampati: II  TM Distance: >3 FB Neck ROM: Full    Dental no notable dental hx.    Pulmonary shortness of breath, COPD, former smoker,       rales    Cardiovascular hypertension, + angina + CAD, + Peripheral Vascular Disease and +CHF  + dysrhythmias Atrial Fibrillation + Valvular Problems/Murmurs AS  Rhythm:Irregular     Neuro/Psych PSYCHIATRIC DISORDERS Anxiety TIACVA    GI/Hepatic negative GI ROS, Neg liver ROS,   Endo/Other  diabetes  Renal/GU Renal InsufficiencyRenal disease     Musculoskeletal  (+) Arthritis ,   Abdominal   Peds  Hematology  (+) anemia ,   Anesthesia Other Findings   Reproductive/Obstetrics                             Anesthesia Physical Anesthesia Plan  ASA: IV  Anesthesia Plan: General   Post-op Pain Management:    Induction: Intravenous  Airway Management Planned: Oral ETT  Additional Equipment: Arterial line, TEE, CVP, PA Cath and Ultrasound Guidance Line Placement  Intra-op Plan:   Post-operative Plan: Post-operative intubation/ventilation  Informed Consent: I have reviewed the patients History and Physical, chart, labs and discussed the procedure including the risks, benefits and alternatives for the proposed anesthesia with the patient or authorized representative who has indicated his/her understanding and acceptance.   Dental advisory given  Plan Discussed with: CRNA and Surgeon  Anesthesia Plan Comments:         Anesthesia Quick Evaluation

## 2014-10-29 NOTE — OR Nursing (Signed)
RN contacted 2S, confirming 2S03 for transfer following surgery.

## 2014-10-29 NOTE — Op Note (Signed)
CARDIOVASCULAR SURGERY OPERATIVE NOTE  10/29/2014  Surgeon:  [name], MD  First Assistant: Doree Fudge, PA-C   Preoperative Diagnosis:  Severe multi-vessel coronary artery disease   Postoperative Diagnosis:  Same   Procedure:  1. Median Sternotomy 2. Extracorporeal circulation 3.   Coronary artery bypass grafting x 4   Left internal mammary graft to the LAD  SVG to OM1  Sequential SVG to PDA (RCA) and OM3 (LCx)  4.   Endoscopic vein harvest from the right leg 5.   Aortic valve replacement using a 25 mm Magna-Ease pericardial valve.   Anesthesia:  General Endotracheal   Clinical History/Surgical Indication:  The patient is an 81 year old gentleman with a history of hypertension, hypercholesterolemia, acute on chronic diastolic heart failure, chronic atrial fibrillation, and chronic diastolic heart failure who presented in June 2016 with progressive shortness of breath, exertional fatigue, and substernal chest discomfort. He underwent cardiac catheterization demonstrating severe diffuse proximal and mid LAD stenosis, total occlusion of the right coronary artery, and severe stenosis of the obtuse marginal branch of the left circumflex. He had mild LV dysfunction with an EF of 45-50%. His echo in 03/2014 showed mild aortic stenosis with a mean gradient of 17 mm Hg with mild MR. A decision was made to continue medical treatment. He has continued to be very limited in his activity due to shortness of breath, chest pain and weakness. He has been taking NTG every day recently and sometimes 3 per day. He was seen by Dr. Kendrick Fries and had PFT's which showed mild obstruction and mild restriction with a severe diffusion defect of 38% of predicted. He felt that his shortness of breath was out of proportion to his pulmonary function.  A repeat echo on 10/16/2014 shows moderate to severe AS  with a mean gradient of only 17 mm Hg but a calculated valve area of only 1.0 cm2 and a dimensionless index of .29. His valve is grossly abnormal with fusion of the noncoronary and left coronary leaflets and his EF is reduced to 45% with diffuse hypokinesis suggesting low flow severe AS.  He has severe multivessel coronary artery disease with mild LV dysfunction and moderate to severe aortic stenosis with progressive shortness of breath, fatigue and chest pain that he is treating with multiple NTG per day. I suspect that he may have low gradient, low EF severe AS based on his echo. He is 59 but still very functional until the past 6 months when he has been extremely limited by these symptoms. He is at increased risk for CABG and AVR but I think it is still the best treatment for him. I discussed the operative procedure with the patient and family including alternatives, benefits and risks; including but not limited to bleeding, blood transfusion, infection, stroke, myocardial infarction, graft failure, heart block requiring a permanent pacemaker, organ dysfunction, and death. Frank Marks understands and agrees to proceed.     Preparation:  The patient was seen in the preoperative holding area and the correct patient, correct operation were confirmed with the patient after reviewing the medical record and catheterization. The consent was signed by me. Preoperative antibiotics were given. A pulmonary arterial line and radial arterial line were placed by the anesthesia team. The patient was taken back to the operating room and positioned supine on the operating room table. After being placed under general endotracheal anesthesia by the anesthesia team a foley catheter was placed. The neck, chest, abdomen, and both legs were prepped  with betadine soap and solution and draped in the usual sterile manner. A surgical time-out was taken and the correct patient and operative procedure were confirmed with the  nursing and anesthesia staff.  TEE: performed by Dr. Corky Sox  This shows a trileaflet aortic valve with moderate calcification of the leaflets, partial fusion of the left and noncoronary leaflets with poor mobility of these leaflets. Mean gradient 15-17. Aortic stenosis looked moderate in appearance although mild by mean gradient. LVEF 45% with global hypokinesis. Trivial MR.   Cardiopulmonary Bypass:  A median sternotomy was performed. The pericardium was opened in the midline. Right ventricular function appeared normal. The ascending aorta was of normal size and had no palpable plaque. There were no contraindications to aortic cannulation or cross-clamping. The patient was fully systemically heparinized and the ACT was maintained > 400 sec. The proximal aortic arch was cannulated with a 20 F aortic cannula for arterial inflow. Venous cannulation was performed via the right atrial appendage using a two-staged venous cannula. An antegrade cardioplegia/vent cannula was inserted into the mid-ascending aorta. A left ventricular vent was placed via the right superior pulmonary vein. A retrograde cardioplegia cannula was placed via the right atrium into the coronary sinus.  Aortic occlusion was performed with a single cross-clamp. Systemic cooling to 32 degrees Centigrade and topical cooling of the heart with iced saline were used. Hyperkalemic antegrade and retrograde cold blood cardioplegia was used to induce diastolic arrest and was then given at about 20 minute intervals throughout the period of arrest to maintain myocardial temperature at or below 10 degrees centigrade. A temperature probe was inserted into the interventricular septum and an insulating pad was placed in the pericardium.   Left internal mammary harvest:  The left side of the sternum was retracted using the Rultract retractor. The left internal mammary artery was harvested as a pedicle graft. All side branches were clipped. It was a  medium-sized vessel of good quality with excellent blood flow. It was ligated distally and divided. It was sprayed with topical papaverine solution to prevent vasospasm.   Endoscopic vein harvest:  The right greater saphenous vein was harvested endoscopically through a 2 cm incision medial to the right knee. It was harvested from the upper thigh to below the knee. It was a large-sized vein of good quality. The side branches were all ligated with 4-0 silk ties.    Coronary arteries:  The coronary arteries were examined.   LAD:  Large vessel with mild distal disease  LCX:  OM1 is a large vessel with a calcified plaque distally just prior to the trifurcation into 3 sub-branches. The OM3 is a large vessel with no distal disease.  RCA:  Diffusely diseased. The PDA was small but graftable with mild distal disease.   Grafts:  1. LIMA to the LAD: 2.0 mm. It was sewn end to side using 8-0 prolene continuous suture. 2. SVG to OM1:  1.75 mm. It was sewn end to side using 7-0 prolene continuous suture. 3. Sequential SVG to PDA:  1.6 mm. It was sewn sequential side to side using 7-0 prolene continuous suture. 4. Sequential SVG to OM3:  1.75 mm. It was sewn sequential end to side using 7-0 prolene continuous suture.  The proximal vein graft anastomoses were performed to the mid-ascending aorta using continuous 6-0 prolene suture. Graft markers were placed around the proximal anastomoses.   Aortic Valve Replacement:   A transverse aortotomy was performed 1 cm above the take-off of the right  coronary artery. The native valve was tricuspid with moderately calcified leaflets and mild annular calcification. The ostia of the coronary arteries were in normal position and were not obstructed. The native valve leaflets were excised and the annulus was decalcified with rongeurs. Care was taken to remove all particulate debris. The left ventricle was directly inspected for debris and then irrigated with ice  saline solution. The annulus was sized and a size 25 mm [employer] Ease pericardial valve was chosen. The model number was [id number] and the serial number was [id number]. While the valve was being prepared 2-0 Ethibond pledgeted horizontal mattress sutures were placed around the annulus with the pledgets in a sub-annular position. The sutures were placed through the sewing ring and the valve lowered into place. The sutures were tied sequentially. The valve seated nicely and the coronary ostia were not obstructed. The prosthetic valve leaflets moved normally and there was no sub-valvular obstruction. The aortotomy was closed using 4-0 Prolene suture in 2 layers with felt strips to reinforce the closure.  Completion:  The patient was rewarmed to 37 degrees Centigrade. The clamp was removed from the LIMA pedicle and there was rapid warming of the septum and return of ventricular fibrillation. The crossclamp was removed with a time of 145 minutes. There was spontaneous return of sinus rhythm. The distal and proximal anastomoses were checked for hemostasis. The position of the grafts was satisfactory. Two temporary epicardial pacing wires were placed on the right atrium and two on the right ventricle. The patient was weaned from CPB without difficulty on dopamine 3 mcg. CPB time was 176 minutes. Cardiac output was 5 LPM. TEE showed a normally function aortic valve prosthesis with no paravalvular leak. LV function appeared improved. Heparin was fully reversed with protamine and the aortic and venous cannulas removed. Hemostasis was achieved. Mediastinal and left pleural drainage tubes were placed. The sternum was closed with double #6 stainless steel wires. The fascia was closed with continuous # 1 vicryl suture. The subcutaneous tissue was closed with 2-0 vicryl continuous suture. The skin was closed with 3-0 vicryl subcuticular suture. All sponge, needle, and instrument counts were reported correct at the end of the  case. Dry sterile dressings were placed over the incisions and around the chest tubes which were connected to pleurevac suction. The patient was then transported to the surgical intensive care unit in critical but stable condition.

## 2014-10-29 NOTE — Anesthesia Preprocedure Evaluation (Addendum)
Anesthesia Evaluation  Patient identified by MRN, date of birth, ID band Patient unresponsive    Reviewed: Patient's Chart, lab work & pertinent test resultsPreop documentation limited or incomplete due to emergent nature of procedure.  Airway Mallampati: Intubated       Dental   Pulmonary COPD, former smoker,    breath sounds clear to auscultation       Cardiovascular hypertension, + angina + CAD, + CABG, + Peripheral Vascular Disease and +CHF  + dysrhythmias Atrial Fibrillation + Valvular Problems/Murmurs AS  Rhythm:Regular Rate:Normal  paced   Neuro/Psych Anxiety TIACVA    GI/Hepatic negative GI ROS, Neg liver ROS,   Endo/Other  diabetes  Renal/GU negative Renal ROS  negative genitourinary   Musculoskeletal  (+) Arthritis , Osteoarthritis,    Abdominal   Peds  Hematology negative hematology ROS (+)   Anesthesia Other Findings   Reproductive/Obstetrics negative OB ROS                            Anesthesia Physical Anesthesia Plan  ASA: IV and emergent  Anesthesia Plan: General   Post-op Pain Management:    Induction: Inhalational  Airway Management Planned: Oral ETT  Additional Equipment: Arterial line, PA Cath and CVP  Intra-op Plan:   Post-operative Plan: Post-operative intubation/ventilation  Informed Consent: I have reviewed the patients History and Physical, chart, labs and discussed the procedure including the risks, benefits and alternatives for the proposed anesthesia with the patient or authorized representative who has indicated his/her understanding and acceptance.   Only emergency history available and History available from chart only  Plan Discussed with: CRNA  Anesthesia Plan Comments:         Anesthesia Quick Evaluation

## 2014-10-29 NOTE — Transfer of Care (Signed)
Immediate Anesthesia Transfer of Care Note  Patient: Frank Marks  Procedure(s) Performed: Procedure(s): CORONARY ARTERY BYPASS GRAFTING (CABG) (N/A) AORTIC VALVE REPLACEMENT (AVR) (N/A) TRANSESOPHAGEAL ECHOCARDIOGRAM (TEE) (N/A)  Patient Location: SICU  Anesthesia Type:General  Level of Consciousness: Patient remains intubated per anesthesia plan  Airway & Oxygen Therapy: Patient remains intubated per anesthesia plan and Patient placed on Ventilator (see vital sign flow sheet for setting)  Post-op Assessment: Report given to RN and Post -op Vital signs reviewed and stable  Post vital signs: Reviewed and stable  Last Vitals:  Filed Vitals:   10/29/14 0559  BP: 156/99  Pulse: 42  Temp: 36.4 C  Resp: 18    Complications: No apparent anesthesia complications

## 2014-10-29 NOTE — Brief Op Note (Signed)
10/29/2014  2:10 PM  PATIENT:  Frank Marks  81 y.o. male  PRE-OPERATIVE DIAGNOSIS:  CAD AS  POST-OPERATIVE DIAGNOSIS:  CAD AS  PROCEDURE: TRANSESOPHAGEAL ECHOCARDIOGRAM (TEE), MEDIAN STERNOTOMY for CORONARY ARTERY BYPASS GRAFTING (CABG) x 4 (LIMA to LAD, SVG to OM1, SVG SEQUUENTIALLY to OM4 and PDA) with EVH from RIGHT GREATER SAPHENOUS VEIN, and AORTIC VALVE REPLACEMENT (AVR) (using a Pericardial tissue valve, size 25)   SURGEON:  Surgeon(s) and Role:    * [name], MD - Primary  PHYSICIAN ASSISTANT: Doree Fudge PA-C  ANESTHESIA:   general  EBL:  Total I/O In: 2450 [I.V.:1500; Blood:450; IV Piggyback:500] Out: 2745 [Urine:1100; Blood:1645]  DRAINS: Chest tubes placed in the mediastinal and pleural spaces   SPECIMEN:  Source of Specimen:  Native aortic valve leaflets  DISPOSITION OF SPECIMEN:  PATHOLOGY  COUNTS CORRECT:  YES  DICTATION: .Dragon Dictation  PLAN OF CARE: Admit to inpatient   PATIENT DISPOSITION:  ICU - intubated and hemodynamically stable.   Delay start of Pharmacological VTE agent (>24hrs) due to surgical blood loss or risk of bleeding: yes  BASELINE WEIGHT: 94 kg  Aortic Valve Etiology   Aortic Insufficiency:  Trivial/Trace  Aortic Valve Disease:  Yes.  Aortic Stenosis:  Yes. Smallest Aortic Valve Area: ?cm2; Highest Mean Gradient: ?mmHg.  Etiology (Choose at least one and up to  5 etiologies):  Degenerative - Calcified  Aortic Valve  Procedure Performed:  Replacement: Yes.  Bioprosthetic Valve. Implant Model Number:[id number], Size:25, Unique Device Identifier:[id number]  Repair/Reconstruction: No.   Aortic Annular Enlargement: No.

## 2014-10-29 NOTE — Anesthesia Procedure Notes (Addendum)
Procedure Name: Intubation Date/Time: 10/29/2014 8:05 AM Performed by: Coralee Rud Pre-anesthesia Checklist: Patient identified, Emergency Drugs available, Suction available, Patient being monitored and Timeout performed Patient Re-evaluated:Patient Re-evaluated prior to inductionOxygen Delivery Method: Circle system utilized Preoxygenation: Pre-oxygenation with 100% oxygen Intubation Type: IV induction Ventilation: Mask ventilation without difficulty Laryngoscope Size: Miller and 3 Grade View: Grade I Tube type: Oral Tube size: 8.0 mm Number of attempts: 1 Airway Equipment and Method: Stylet Placement Confirmation: ETT inserted through vocal cords under direct vision,  positive ETCO2 and breath sounds checked- equal and bilateral Secured at: 23 cm Tube secured with: Tape Dental Injury: Teeth and Oropharynx as per pre-operative assessment  Comments: Lyndee Leo, SRNA      Right IJ vein for introducer placement.

## 2014-10-29 NOTE — Brief Op Note (Signed)
10/29/2014  11:12 PM  PATIENT:  Frank Marks  81 y.o. male  PRE-OPERATIVE DIAGNOSIS:  bleeding   POST-OPERATIVE DIAGNOSIS:  Bleeding, post op CABG  PROCEDURE:  Procedure(s): EXPLORATION POST OPERATIVE OPEN HEART (N/A) for bleeding  SURGEON:  Surgeon(s) and Role:    * [name], MD - Primary  PHYSICIAN ASSISTANT: none  ASSISTANTS: Frutoso Schatz, RNFA   ANESTHESIA:   general  EBL:  Total I/O In: 2651.9 [I.V.:1168.9; Blood:1483] Out: 1320 [Urine:500; Blood:300; Chest Tube:520]  BLOOD ADMINISTERED:none  DRAINS: same chest tubes in place   LOCAL MEDICATIONS USED:  NONE  SPECIMEN:  No Specimen  DISPOSITION OF SPECIMEN:  N/A  COUNTS:  YES  TOURNIQUET:  * No tourniquets in log *  DICTATION: .Note written in EPIC  PLAN OF CARE: Admit to inpatient   PATIENT DISPOSITION:  ICU - intubated and hemodynamically stable.   Delay start of Pharmacological VTE agent (>24hrs) due to surgical blood loss or risk of bleeding: yes

## 2014-10-29 NOTE — Progress Notes (Signed)
Day of Surgery Procedure(s) (LRB): CORONARY ARTERY BYPASS GRAFTING (CABG) (N/A) AORTIC VALVE REPLACEMENT (AVR) (N/A) TRANSESOPHAGEAL ECHOCARDIOGRAM (TEE) (N/A) Subjective: Intubated,sedated  Objective: Vital signs in last 24 hours: Temp:  [95 F (35 C)-97.9 F (36.6 C)] 97.7 F (36.5 C) (09/15 2020) Pulse Rate:  [42-86] 86 (09/15 2020) Cardiac Rhythm:  [-] A-V Sequential paced (09/15 2000) Resp:  [0-24] 0 (09/15 2020) BP: (67-156)/(34-99) 104/54 mmHg (09/15 2000) SpO2:  [97 %-100 %] 100 % (09/15 2020) Arterial Line BP: (75-142)/(50-75) 120/66 mmHg (09/15 2020) FiO2 (%):  [50 %] 50 % (09/15 2000) Weight:  [207 lb (93.895 kg)] 207 lb (93.895 kg) (09/15 0559)  Hemodynamic parameters for last 24 hours: PAP: (28-43)/(17-29) 42/25 mmHg CO:  [3.2 L/min-4.3 L/min] 3.9 L/min CI:  [1.5 L/min/m2-2 L/min/m2] 1.8 L/min/m2  Intake/Output from previous day:   Intake/Output this shift: Total I/O In: 753.7 [I.V.:83.7; Blood:670] Out: 575 [Urine:275; Chest Tube:300]  General appearance: intubated Neurologic: sedated Heart: regular rate and rhythm Lungs: clear to auscultation bilaterally  Lab Results:  Recent Labs  10/29/14 1451 10/29/14 1930 10/29/14 1944  WBC 16.5* 9.4  --   HGB 8.6* 7.4* 6.8*  HCT 25.2* 21.4* 20.0*  PLT 96* 91*  --    BMET:  Recent Labs  10/29/14 0623  10/29/14 1328 10/29/14 1441 10/29/14 1930 10/29/14 1944  NA 137  < > 136 137  --  139  K 4.6  < > 4.6 4.4  --  4.4  CL 105  < > 100*  --   --  102  CO2 25  --   --   --   --   --   GLUCOSE 156*  < > 132* 110*  --  128*  BUN 24*  < > 24*  --   --  20  CREATININE 1.55*  < > 1.00  --  1.09 0.90  CALCIUM 9.9  --   --   --   --   --   < > = values in this interval not displayed.  PT/INR:  Recent Labs  10/29/14 1930  LABPROT 17.2*  INR 1.39   ABG    Component Value Date/Time   PHART 7.254* 10/29/2014 1441   HCO3 21.9 10/29/2014 1441   TCO2 23 10/29/2014 1944   ACIDBASEDEF 5.0* 10/29/2014 1441    O2SAT 97.0 10/29/2014 1441   CBG (last 3)   Recent Labs  10/29/14 0614  GLUCAP 142*    Assessment/Plan: S/P Procedure(s) (LRB): CORONARY ARTERY BYPASS GRAFTING (CABG) (N/A) AORTIC VALVE REPLACEMENT (AVR) (N/A) TRANSESOPHAGEAL ECHOCARDIOGRAM (TEE) (N/A) -  Mr. Streater has had bleeding since return from OR. Initially ~ 150 ml / hour. Treated with platelets.  Then between 6 and 6:30 he had 250 ml out. PRBC and FFP ordered and given. He only had 40 ml the next 30 minutes, but repeated the pattern between 7 and 8 PM. Over the past 30 minutes he has put out an additional 120 ml.  His Hgb is 7.4, PLT 91K, PT= 17.2 and PTT = 46  Will give 25 mg of protamine, an additional bag of PLT, and 2 more units of FFP. Will also give additional PRBC. At this point re-ex[ploration for bleeding is probably the best option.  Dr. Laneta Simmers informed. He will come in to assess with a plan for re-exploration   LOS: 0 days    Loreli Slot 10/29/2014

## 2014-10-29 NOTE — Brief Op Note (Signed)
10/29/2014  2:20 PM  PATIENT:  Frank Marks  81 y.o. male  PRE-OPERATIVE DIAGNOSIS:  CAD AS  POST-OPERATIVE DIAGNOSIS:  CAD AS  PROCEDURE:  Procedure(s): CORONARY ARTERY BYPASS GRAFTING (CABG) (N/A) x 4  LIMA to LAD SVG to OM1 Sequential SVG to PDA (RCA) and OM3 (LCx).  AORTIC VALVE REPLACEMENT (AVR) (N/A) TRANSESOPHAGEAL ECHOCARDIOGRAM (TEE) (N/A)  SURGEON:  Surgeon(s) and Role:    * [name], MD - Primary  PHYSICIAN ASSISTANT: Doree Fudge, PA-C  ASSISTANTS: Caroline More RNFA   ANESTHESIA:   general  EBL:  Total I/O In: 2700 [I.V.:1500; Blood:450; IV Piggyback:750] Out: 2745 [Urine:1100; Blood:1645]  BLOOD ADMINISTERED:none  DRAINS: 2 chest tubes in mediastinum and 1 in left pleural space   LOCAL MEDICATIONS USED:  NONE  SPECIMEN:  Aortic valve  DISPOSITION OF SPECIMEN:  PATHOLOGY  COUNTS:  YES  TOURNIQUET:  * No tourniquets in log *  DICTATION: .Note written in EPIC  PLAN OF CARE: Admit to inpatient   PATIENT DISPOSITION:  ICU - intubated and hemodynamically stable.   Delay start of Pharmacological VTE agent (>24hrs) due to surgical blood loss or risk of bleeding: yes

## 2014-10-29 NOTE — Interval H&P Note (Signed)
History and Physical Interval Note:  10/29/2014 6:00 AM  Frank Marks  has presented today for surgery, with the diagnosis of CAD AS  The various methods of treatment have been discussed with the patient and family. After consideration of risks, benefits and other options for treatment, the patient has consented to  Procedure(s): CORONARY ARTERY BYPASS GRAFTING (CABG) (N/A) AORTIC VALVE REPLACEMENT (AVR) (N/A) TRANSESOPHAGEAL ECHOCARDIOGRAM (TEE) (N/A) as a surgical intervention .  The patient's history has been reviewed, patient examined, no change in status, stable for surgery.  I have reviewed the patient's chart and labs.  Questions were answered to the patient's satisfaction.     [name]

## 2014-10-29 NOTE — Progress Notes (Signed)
Utilization review completed. Prisha Hiley, RN, BSN. 

## 2014-10-29 NOTE — Progress Notes (Signed)
  Echocardiogram Echocardiogram Transesophageal has been performed.  Janalyn Harder 10/29/2014, 8:25 AM

## 2014-10-29 NOTE — Transfer of Care (Signed)
Immediate Anesthesia Transfer of Care Note  Patient: Frank Marks  Procedure(s) Performed: Procedure(s): EXPLORATION POST OPERATIVE OPEN HEART (N/A)  Patient Location: SICU  Anesthesia Type:General  Level of Consciousness: sedated and Patient remains intubated per anesthesia plan  Airway & Oxygen Therapy: Patient remains intubated per anesthesia plan and Patient placed on Ventilator (see vital sign flow sheet for setting)  Post-op Assessment: Report given to RN  Post vital signs: Reviewed and stable  Last Vitals:  Filed Vitals:   10/29/14 2120  BP:   Pulse: 86  Temp: 36.6 C  Resp: 16    Complications: No apparent anesthesia complications

## 2014-10-29 NOTE — H&P (Signed)
[address]       [place] [address]             [phone]      Cardiothoracic Surgery History and Physical   PCP is Ailene Ravel, MD Referring Provider is [name], MD  Chief Complaint  Patient presents with  . Coronary Artery Disease      . Shortness of Breath  . Fatigue    HPI:  The patient is an 81 year old gentleman with a history of hypertension, hypercholesterolemia, acute on chronic diastolic heart failure, chronic atrial fibrillation, and chronic diastolic heart failure who presented in June 2016 with progressive shortness of breath, exertional fatigue, and substernal chest discomfort. He underwent cardiac catheterization demonstrating severe diffuse proximal and mid LAD stenosis, total occlusion of the right coronary artery, and severe stenosis of the obtuse marginal branch of the left circumflex. He had mild LV dysfunction with an EF of 45-50%. His echo in 03/2014 showed mild aortic stenosis with a mean gradient of 17 mm Hg with mild MR. A decision was made to continue medical treatment. He has continued to be very limited in his activity due to shortness of breath, chest pain and weakness. He has been taking NTG every day recently and sometimes 3 per day. He was seen by Dr. Kendrick Fries and had PFT's which showed mild obstruction and mild restriction with a severe diffusion defect of 38% of predicted. He felt that his shortness of breath was out of proportion to his pulmonary function.  A repeat echo on 10/16/2014 shows moderate to severe AS with a mean gradient of only 17 mm Hg but a calculated valve area of only 1.0 cm2 and a dimensionless index of .29. His valve is grossly abnormal with fusion of the noncoronary and left coronary leaflets and his EF is reduced to 45% with diffuse hypokinesis suggesting low flow severe AS.  Past Medical History  Diagnosis Date  . Hypertension   . Hypercholesterolemia   . Stroke   .  Chronic back pain   . Hx of cardiovascular stress test     Lex MV 3/14: Not gated, apical thinning, no ischemia.   Marland Kitchen Hx of echocardiogram     Echo 04/24/12: mild LVH, EF 50-55%, mild AS (mean 9), MAC, mild MR, mod-severe BAE, mod TR, PASP 39  . Acute on chronic diastolic CHF (congestive heart failure), NYHA class 3 07/29/2014  . TIA (transient ischemic attack) 06/2007    "had 2-3 little ones around that time"  . Carotid artery stenosis   . Chronic atrial fibrillation   . Type II diabetes mellitus   . Osteoarthritis   . Arthritis     "back" (07/29/2014)  . Chronic lower back pain   . Exertional shortness of breath     chronic/notes 07/29/2014  . Pneumonia ~ 1951; 02/2014    Past Surgical History  Procedure Laterality Date  . Lumbar laminectomy/decompression microdiscectomy  1990's X 1; 2000's X1  . Knee arthroscopy Left   . Back surgery    . Tonsillectomy    . Appendectomy    . Replacement total knee Bilateral ~ 2001-2002  . Total hip arthroplasty Left ~ 2003  . Joint replacement    . Cataract extraction w/ intraocular lens implant, bilateral Bilateral   . Cardiac catheterization  07/29/2014  . Cardiac catheterization N/A 07/29/2014    Procedure: Left Heart Cath and Coronary Angiography; Surgeon: [name], MD; Location: [facility] INVASIVE CV  LAB; Service: Cardiovascular; Laterality: N/A;    Family History  Problem Relation Age of Onset  . Other      no family hx of coronary artery disease  . Stroke Father   . Healthy Mother   . Arthritis Mother   . Alzheimer's disease Mother     Social History Social History  Substance Use Topics  . Smoking status: Former Smoker -- 1.00 packs/day for 5 years    Types: Cigarettes    Quit date: 08/20/1984  . Smokeless tobacco: Former Neurosurgeon    Types: Chew     Comment: "stopped  smoking cigarettes in the 1970's; quit chewing ~ 2000"  . Alcohol Use: No    Current Outpatient Prescriptions  Medication Sig Dispense Refill  . acetaminophen (TYLENOL) 325 MG tablet Take 2 tablets (650 mg total) by mouth every 4 (four) hours as needed for headache or mild pain.    . Albuterol Sulfate (PROAIR RESPICLICK) 108 (90 BASE) MCG/ACT AEPB Inhale 1 puff into the lungs every 6 (six) hours as needed (shortness of breath). 1 each 0  . aspirin EC 81 MG tablet Take 1 tablet (81 mg total) by mouth daily. 30 tablet 12  . BD PEN NEEDLE NANO U/F 32G X 4 MM MISC USE WITH INSULIN ADMINISTRATION DAILY  5  . CIPRO HC otic suspension Place 3 drops in ear(s) 2 (two) times daily. In right ear  0  . Cyanocobalamin (VITAMIN B 12 PO) Take 2,500 mg by mouth daily.     . dabigatran (PRADAXA) 150 MG CAPS capsule TAKE ONE CAPSULE BY MOUTH EVERY 12 HOURS 60 capsule 6  . diltiazem (CARDIZEM CD) 240 MG 24 hr capsule Take 1 capsule (240 mg total) by mouth daily. 30 capsule 6  . feeding supplement, ENSURE ENLIVE, (ENSURE ENLIVE) LIQD Take 237 mLs by mouth 2 (two) times daily between meals. 237 mL 12  . glimepiride (AMARYL) 4 MG tablet Take 4 mg by mouth 2 (two) times daily.    . isosorbide mononitrate (IMDUR) 60 MG 24 hr tablet Take 1 tablet (60 mg total) by mouth daily. 31 tablet 11  . LANTUS SOLOSTAR 100 UNIT/ML Solostar Pen USE 20 UNITS AT BEDTIME  3  . lisinopril (PRINIVIL,ZESTRIL) 20 MG tablet Take 20 mg by mouth daily.    . metFORMIN (GLUCOPHAGE-XR) 500 MG 24 hr tablet Take 2 tablets (1,000 mg total) by mouth 2 (two) times daily.    . metoprolol tartrate (LOPRESSOR) 25 MG tablet Take 1 tablet (25 mg total) by mouth 2 (two) times daily. 60 tablet 11  . nitroGLYCERIN (NITROSTAT) 0.4 MG SL tablet Place 1 tablet (0.4 mg total) under the tongue every 5 (five) minutes as needed for chest pain. 100 tablet 12  . pravastatin  (PRAVACHOL) 80 MG tablet Take 80 mg by mouth daily.    . ranolazine (RANEXA) 500 MG 12 hr tablet Take 1 tablet (500 mg total) by mouth 2 (two) times daily. 60 tablet 6  . TRUETRACK TEST test strip 1 each by Other route 2 (two) times daily.   4   No current facility-administered medications for this visit.    No Known Allergies  Review of Systems  Constitutional: Positive for activity change and fatigue.  HENT: Positive for hearing loss.   Partial upper denture. Last saw his dentist 2-3 years ago.  Eyes: Negative.  Respiratory: Positive for shortness of breath.  Cardiovascular: Positive for chest pain. Negative for leg swelling.   Chest pain with minimal exertion and  at rest  Gastrointestinal: Negative.  Endocrine: Negative.  Genitourinary: Negative.  Musculoskeletal: Negative.  Skin: Negative.  Allergic/Immunologic: Negative.  Neurological: Negative.  Hematological: Negative.  Psychiatric/Behavioral: Negative.    BP 146/81 mmHg  Pulse 68  Resp 16  Ht 6' (1.829 m)  Wt 207 lb (93.895 kg)  BMI 28.07 kg/m2  SpO2 97% Physical Exam  Constitutional: He is oriented to person, place, and time.  Elderly white male in no distress.  HENT:  Head: Normocephalic and atraumatic.  Mouth/Throat: Oropharynx is clear and moist.  Eyes: EOM are normal. Pupils are equal, round, and reactive to light.  Neck: Normal range of motion. Neck supple. No JVD present. No tracheal deviation present. No thyromegaly present.  Cardiovascular: Normal rate and regular rhythm.  Murmur heard. 2/6 systolic murmur along RSB  Pulmonary/Chest: Effort normal and breath sounds normal. No respiratory distress. He has no wheezes. He has no rales.  Abdominal: Soft. Bowel sounds are normal. He exhibits no distension and no mass. There is no tenderness.  Musculoskeletal: Normal range of motion. He exhibits no edema or tenderness.  Lymphadenopathy:   He has no cervical  adenopathy.  Neurological: He is alert and oriented to person, place, and time. He has normal strength. No cranial nerve deficit or sensory deficit.  Skin: Skin is warm and dry.  Psychiatric: He has a normal mood and affect.     Diagnostic Tests:    [facility]            [address]            [place], [place] [address]              [phone]  ------------------------------------------------------------------- Echocardiography  (Report amended )  Patient:  Radek, Carnero MR #:    [id number] Study Date: 04/08/2014 Gender:   M Age:    76 Height:   182.9 cm Weight:   94.8 kg BSA:    2.21 m^2 Pt. Status: Room:  SONOGRAPHER Randa Evens, Will ORDERING   [name] REFERRING  [name] ATTENDING  Thurmon Fair, MD PERFORMING  [facility] Outpatient  cc:  ------------------------------------------------------------------- LV EF: 45% -  50%  ------------------------------------------------------------------- History:  PMH: Acquired from the patient and from the patient&'s chart. Dyspnea. Atrial fibrillation. Aortic stenosis. Transient ischemic attack. Risk factors: Hypertension. Diabetes mellitus. Dyslipidemia.  ------------------------------------------------------------------- Study Conclusions  - Left ventricle: Wall thickness was increased in a pattern of moderate LVH. Systolic function was mildly reduced. The estimated ejection fraction was in the range of 45% to 50%. Wall motion was normal; there were no regional wall motion abnormalities. - Aortic valve: There was mild stenosis. - Mitral valve: Calcified annulus. Mildly thickened leaflets . There was mild regurgitation. - Left atrium: The atrium was mildly dilated. - Right atrium: The atrium was mildly dilated. - Pulmonary arteries: Systolic pressure was moderately increased. PA peak pressure: 50 mm Hg  (S).  Echocardiography. M-mode, complete 2D, spectral Doppler, and color Doppler. Birthdate: Patient birthdate: 08-15-1926. Age: Patient is 81 yr old. Sex: Gender: male.  BMI: 28.3 kg/m^2. Blood pressure:   122/86 Patient status: Outpatient. Study date: Study date: 04/08/2014. Study time: 11:16 AM. Location: [facility]  -------------------------------------------------------------------  ------------------------------------------------------------------- Left ventricle:  Wall thickness was increased in a pattern of moderate LVH.  Systolic function was mildly reduced. The estimated ejection fraction was in the range of 45% to 50%. Wall motion was normal; there were no regional wall motion abnormalities. The study was not technically  sufficient to allow evaluation of LV diastolic dysfunction due to atrial fibrillation. Indeterminate mean left atrial pressure.  ------------------------------------------------------------------- Aortic valve:  Trileaflet; mildly thickened, mildly calcified leaflets. Doppler:  There was mild stenosis.  There was no significant regurgitation.  VTI ratio of LVOT to aortic valve: 0.26. Valve area (VTI): 1.07 cm^2. Indexed valve area (VTI): 0.48 cm^2/m^2. Mean velocity ratio of LVOT to aortic valve: 0.28. Valve area (Vmean): 1.16 cm^2. Indexed valve area (Vmean): 0.52 cm^2/m^2.  Mean gradient (S): 17 mm Hg.  ------------------------------------------------------------------- Aorta: Ascending aorta: The ascending aorta was mildly dilated.  ------------------------------------------------------------------- Mitral valve:  Calcified annulus. Mildly thickened leaflets . Leaflet separation was normal. Doppler: Transvalvular velocity was within the normal range. There was no evidence for stenosis. There was mild regurgitation.  Peak gradient (D): 3 mm  Hg.  ------------------------------------------------------------------- Left atrium: The atrium was mildly dilated.  ------------------------------------------------------------------- Right ventricle: The cavity size was normal. Wall thickness was normal. Systolic function was normal.  ------------------------------------------------------------------- Pulmonic valve:  The valve appears to be grossly normal. Doppler: There was no significant regurgitation.  ------------------------------------------------------------------- Pulmonary artery:  Systolic pressure was moderately increased.  ------------------------------------------------------------------- Right atrium: The atrium was mildly dilated.  ------------------------------------------------------------------- Systemic veins: Inferior vena cava: The vessel was dilated. The respirophasic diameter changes were blunted (< 50%), consistent with elevated central venous pressure.  ------------------------------------------------------------------- Measurements  Left ventricle              Value     Reference LV ID, ED, PLAX chordal      (H)   54.3 mm    43 - 52 LV ID, ES, PLAX chordal      (H)   40.2 mm    23 - 38 LV fx shortening, PLAX chordal  (L)   26  %    >=29 LV PW thickness, ED            14.4 mm    --------- IVS/LV PW ratio, ED            1.01      <=1.3 Stroke volume, 2D             60  ml    --------- Stroke volume/bsa, 2D           27  ml/m^2  --------- LV e&', lateral              7.43 cm/s   --------- LV E/e&', lateral             11.69     ---------  Ventricular septum            Value     Reference IVS thickness, ED             14.5 mm    ---------  LVOT                   Value      Reference LVOT ID, S                23  mm    --------- LVOT area                 4.15 cm^2   --------- LVOT ID                  23  mm    --------- LVOT peak velocity, S           77.8 cm/s   --------- LVOT mean velocity, S  53.2 cm/s   --------- LVOT VTI, S                14.5 cm    --------- Stroke volume (SV), LVOT DP        60.2 ml    --------- Stroke index (SV/bsa), LVOT DP      27.2 ml/m^2  ---------  Aortic valve               Value     Reference Aortic valve mean velocity, S       191  cm/s   --------- Aortic valve VTI, S            56.2 cm    --------- Aortic mean gradient, S          17  mm Hg  --------- VTI ratio, LVOT/AV            0.26      --------- Aortic valve area, VTI          1.07 cm^2   --------- Aortic valve area/bsa, VTI        0.48 cm^2/m^2 --------- Velocity ratio, mean, LVOT/AV       0.28      --------- Aortic valve area, mean velocity     1.16 cm^2   --------- Aortic valve area/bsa, mean        0.52 cm^2/m^2 --------- velocity  Aorta                   Value     Reference Aortic root ID, ED            40  mm    --------- Ascending aorta ID, A-P, S        36  mm    ---------  Left atrium                Value     Reference LA ID, A-P, ES              44  mm    --------- LA ID/bsa, A-P              1.99 cm/m^2  <=2.2 LA volume, S               92  ml    --------- LA volume/bsa, S             41.6 ml/m^2  --------- LA volume, ES, 1-p A4C          78  ml    --------- LA volume/bsa, ES, 1-p A4C        35.3 ml/m^2   --------- LA volume, ES, 1-p A2C          111  ml    --------- LA volume/bsa, ES, 1-p A2C        50.2 ml/m^2  ---------  Mitral valve               Value     Reference Mitral E-wave peak velocity        86.87 cm/s   --------- Mitral deceleration time         215  ms    150 - 230 Mitral peak gradient, D          3   mm Hg  ---------  Pulmonary arteries            Value     Reference PA pressure, S, DP        (  H)   50  mm Hg  <=30  Tricuspid valve              Value     Reference Tricuspid regurg peak velocity      324  cm/s   --------- Tricuspid peak RV-RA gradient       42  mm Hg  ---------  Systemic veins              Value     Reference Estimated CVP               8   mm Hg  ---------  Right ventricle              Value     Reference RV pressure, S, DP        (H)   50  mm Hg  <=30  Legend: (L) and (H) mark values outside specified reference range.  ------------------------------------------------------------------- Lenard Simmer, MD 2016-02-24T15:31:52  Cardiac Cath:   Conclusion    1. Mid RCA lesion, 95% stenosed. 2. Prox RCA lesion, 60% stenosed.  FINAL CONCLUSIONS:  SEVERE MULTIVESSEL CAD WITH DIFFUSE PROXIMAL LAD STENOSIS, SEVERE OM1 STENOSIS, AND TOTAL OCCLUSION OF THE RCA WITH LEFT-TO-RIGHT COLLATERALS  MILD SEGMENTAL LV SYSTOLIC DYSFUNCTION WITH NORMAL LVEDP  RECOMMENDATIONS:  DIFFICULT SITUATION IN THIS ELDERLY GENTLEMAN WITH PROGRESSIVE DECLINE IN FUNCTIONAL CAPACITY, PRIMARILY RELATED TO WORSENING SHORTNESS OF BREATH. IN MY OPINION HIS CORONARY DISEASE WOULD CLEARLY BE BEST TREATED WITH CABG (MULTIVESSEL DIFFUSE DISEASE, DIABETES, LV SYSTOLIC DYSFUNCTION). HOWEVER, I AM CONCERNED ABOUT HIS RISK OF CABG AT AGE  27. PCI +/- MEDICAL THERAPY IS ANOTHER OPTION BUT BECAUSE OF RCA TOTAL OCCLUSION, DIFFUSE LAD STENOSIS EXTENDING BACK TO THE OSTIUM, NEED FOR CHRONIC ANTICOAGULATION, THIS APPROACH ALSO HAS SIGNIFICANT LIMITATIONS.   WILL ADMIT, CYCLE ENZYMES AS THE PATIENT HAD RESTING CHEST PAIN TODAY, AND REVIEW FILMS/CLINICAL SCENARIO WITH COLLEAGUES  DISCUSSED AT LENGTH WITH PATIENT AND FAMILY     Coronary Findings    Dominance: Co-dominant   Left Anterior Descending   . Prox LAD lesion, 80% stenosed. calcified diffuse .   Marland Kitchen Mid LAD lesion, 80% stenosed. calcified diffuse .     Left Circumflex   . Dist Cx lesion, 40% stenosed. diffuse .   Marland Kitchen First Obtuse Marginal Branch   . 1st Mrg lesion, 90% stenosed. calcified .   Marland Kitchen Third Biomedical engineer   . 3rd Mrg lesion, 70% stenosed. Ostial lesion     Right Coronary Artery  Dist RCA filled by collaterals from Dist Cx.   . Prox RCA lesion, 60% stenosed. calcified diffuse .   Marland Kitchen Mid RCA lesion, 95% stenosed.   . Dist RCA lesion, 100% stenosed.      Wall Motion                 Left Heart    Left Ventricle There is mild left ventricular systolic dysfunction. The left ventricular ejection fraction is 45-50% by visual estimate. There are wall motion abnormalities in the left ventricle. There are segmental wall motion abnormalities in the left ventricle.    Coronary Diagrams    Diagnostic Diagram            Implants    Name ID Temporary Type Supply   No information to display    Order-Level Documents:    There are no order-level documents.    Encounter-Level Documents - 07/23/14:      Scan on 07/31/2014 12:29 PM by  Provider Default, MDScan on 07/31/2014 12:29 PM by Provider Default, MD     Scan on 07/31/2014 12:06 PM by Provider Default, MDScan on 07/31/2014 12:06 PM by Provider Default, MD     Scan on 07/30/2014 9:30 AM by  Provider Default, MDScan on 07/30/2014 9:30 AM by Provider Default, MD     Scan on 07/29/2014 3:22 PM by Provider Default, MDScan on 07/29/2014 3:22 PM by Provider Default, MD     Electronic signature on 07/29/2014 10:49 AM    Signed    Electronically signed by [name], MD on 07/29/14 at 1618 EDT                 **         *[facility]*            [address]            [place], [place] [address]              [phone]  ------------------------------------------------------------------- Transthoracic Echocardiography  Patient:  Aleczander, Fandino MR #:    [id number] Study Date: 10/16/2014 Gender:   M Age:    87 Height:   185.4 cm Weight:   93.9 kg BSA:    2.21 m^2 Pt. Status: Room:  ATTENDING  Evelene Croon, MD ORDERING   Evelene Croon, MD REFERRING  Evelene Croon, MD SONOGRAPHER [facility] PERFORMING  [facility] Outpatient  cc:  ------------------------------------------------------------------- LV EF: 45%  ------------------------------------------------------------------- Indications:   Aortic stenosis 424.1.  ------------------------------------------------------------------- History:  PMH: PHTN. Former Smoker. Atrial fibrillation. Coronary artery disease. Aortic valve disease. Stroke. Chronic obstructive pulmonary disease. Risk factors: Hypertension. Diabetes mellitus.  ------------------------------------------------------------------- Study Conclusions  - Left ventricle: The cavity size was normal. Wall thickness was increased in a pattern of mild LVH. Indeterminant diastolic function (atrial fibrillation). The estimated ejection fraction was 45%. Diffuse hypokinesis. GLS -11.4%, abnormal strain. - Aortic valve: Trileaflet but with partial fusion of the noncoronary and left coronary cusps.  Moderately calcified leaflets. Mild aortic stenosis by mean gradient, moderate-severe by calculated valve area. I suspect low gradient due to low flow, suspect aortic stenosis is moderate. It is moderate by planimetry. Mean gradient (S): 17 mm Hg. Valve area (VTI): 1.0 cm^2. - Aorta: Mildly dilated aortic root. Aortic root dimension: 38 mm (ED). - Mitral valve: Moderately calcified annulus. There was trivial regurgitation. - Left atrium: The atrium was severely dilated. - Right ventricle: The cavity size was normal. Systolic function was normal. - Right atrium: The atrium was moderately dilated. - Tricuspid valve: Peak RV-RA gradient (S): 43 mm Hg. - Pulmonary arteries: PA peak pressure: 46 mm Hg (S). - Inferior vena cava: The vessel was normal in size. The respirophasic diameter changes were in the normal range (= 50%), consistent with normal central venous pressure.  Impressions:  - The patient was in atrial fibrillation. Normal LV size with mild LV hypertrophy. EF 45% with diffuse hypokinesis. Biatrial enlargement. Normal RV size and systolic function. Moderate aortic stenosis.  Transthoracic echocardiography. M-mode, complete 2D, spectral Doppler, and color Doppler. Birthdate: Patient birthdate: 1926-07-21. Age: Patient is 81 yr old. Sex: Gender: male. BMI: 27.3 kg/m^2. Patient status: Inpatient. Study date: Study date: 10/16/2014. Study time: 01:04 PM. Location: Echo laboratory.  -------------------------------------------------------------------  ------------------------------------------------------------------- Left ventricle: The cavity size was normal. Wall thickness was increased in a pattern of mild LVH. Indeterminant diastolic function (atrial fibrillation). The estimated ejection fraction was 45%. Diffuse hypokinesis. GLS -11.4%, abnormal strain.  ------------------------------------------------------------------- Aortic  valve: Trileaflet but with partial fusion of the noncoronary and left coronary cusps. Moderately calcified leaflets. Doppler: There was no regurgitation. Mild aortic stenosis by mean gradient, moderate-severe by calculated valve area. I suspect low gradient due to low flow, suspect aortic stenosis is moderate. It is moderate by planimetry.  VTI ratio of LVOT to aortic valve: 0.3. Valve area (VTI): 1.0 cm^2. Indexed valve area (VTI): 0.42 cm^2/m^2. Peak velocity ratio of LVOT to aortic valve: 0.29. Indexed valve area (Vmax): 0.41 cm^2/m^2. Mean velocity ratio of LVOT to aortic valve: 0.28. Indexed valve area (Vmean): 0.4 cm^2/m^2.  Mean gradient (S): 17 mm Hg. Peak gradient (S): 28 mm Hg.  ------------------------------------------------------------------- Aorta: Mildly dilated aortic root.  ------------------------------------------------------------------- Mitral valve:  Moderately calcified annulus. Doppler:  There was no evidence for stenosis.  There was trivial regurgitation. Peak gradient (D): 4 mm Hg.  ------------------------------------------------------------------- Left atrium: The atrium was severely dilated.  ------------------------------------------------------------------- Right ventricle: The cavity size was normal. Systolic function was normal.  ------------------------------------------------------------------- Pulmonic valve:  Structurally normal valve.  Cusp separation was normal. Doppler: Transvalvular velocity was within the normal range. There was no regurgitation.  ------------------------------------------------------------------- Tricuspid valve:  Doppler: There was mild regurgitation.  ------------------------------------------------------------------- Right atrium: The atrium was moderately dilated.  ------------------------------------------------------------------- Pericardium: There was no pericardial  effusion.  ------------------------------------------------------------------- Systemic veins: Inferior vena cava: The vessel was normal in size. The respirophasic diameter changes were in the normal range (= 50%), consistent with normal central venous pressure.  ------------------------------------------------------------------- Post procedure conclusions Ascending Aorta:  - Mildly dilated aortic root.  ------------------------------------------------------------------- Measurements  Left ventricle              Value     Reference LV ID, ED, PLAX chordal          49.9 mm    43 - 52 LV ID, ES, PLAX chordal          31.3 mm    23 - 38 LV fx shortening, PLAX chordal      37  %    >=29 LV PW thickness, ED            14.5 mm    --------- IVS/LV PW ratio, ED            0.85      <=1.3 Stroke volume, 2D             54  ml    --------- Stroke volume/bsa, 2D           24  ml/m^2  --------- LV ejection fraction, 1-p A4C       34  %    --------- LV end-diastolic volume, 2-p       116  ml    --------- LV end-systolic volume, 2-p        69  ml    --------- LV ejection fraction, 2-p         40  %    --------- Stroke volume, 2-p            47  ml    --------- LV end-diastolic volume/bsa, 2-p     52  ml/m^2  --------- LV end-systolic volume/bsa, 2-p      31  ml/m^2  --------- Stroke volume/bsa, 2-p          21.1 ml/m^2  --------- LV e&', lateral              8.41 cm/s   --------- LV E/e&', lateral  11.65     --------- Longitudinal strain, TDI         12  %    ---------  Ventricular septum            Value     Reference IVS thickness, ED             12.3 mm     ---------  LVOT                   Value     Reference LVOT ID, S                20  mm    --------- LVOT area                 3.14 cm^2   --------- LVOT peak velocity, S           76  cm/s   --------- LVOT mean velocity, S           53.5 cm/s   --------- LVOT VTI, S                17.3 cm    ---------  Aortic valve               Value     Reference Aortic valve peak velocity, S       265  cm/s   --------- Aortic valve mean velocity, S       192  cm/s   --------- Aortic valve VTI, S            58.4 cm    --------- Aortic mean gradient, S          17  mm Hg  --------- Aortic peak gradient, S          28  mm Hg  --------- VTI ratio, LVOT/AV            0.3      --------- Aortic valve area, VTI          0.93 cm^2   --------- Aortic valve area/bsa, VTI        0.42 cm^2/m^2 --------- Velocity ratio, peak, LVOT/AV       0.29      --------- Aortic valve area/bsa, peak        0.41 cm^2/m^2 --------- velocity Velocity ratio, mean, LVOT/AV       0.28      --------- Aortic valve area/bsa, mean        0.4  cm^2/m^2 --------- velocity  Aorta                   Value     Reference Aortic root ID, ED            38  mm    ---------  Left atrium                Value     Reference LA ID, A-P, ES              44  mm    --------- LA ID/bsa, A-P              1.99 cm/m^2  <=2.2 LA volume, S               116  ml    --------- LA volume/bsa, S             52.4 ml/m^2  --------- LA volume, ES, 1-p  A4C          95.5 ml    --------- LA volume/bsa, ES, 1-p A4C         43.2 ml/m^2  --------- LA volume, ES, 1-p A2C          139  ml    --------- LA volume/bsa, ES, 1-p A2C        62.8 ml/m^2  ---------  Mitral valve               Value     Reference Mitral E-wave peak velocity        98  cm/s   --------- Mitral A-wave peak velocity        34.9 cm/s   --------- Mitral deceleration time      (H)  313  ms    150 - 230 Mitral peak gradient, D          4   mm Hg  --------- Mitral E/A ratio, peak          2.8      ---------  Pulmonary arteries            Value     Reference PA pressure, S, DP         (H)  46  mm Hg  <=30  Tricuspid valve              Value     Reference Tricuspid regurg peak velocity      327  cm/s   --------- Tricuspid peak RV-RA gradient       43  mm Hg  ---------  Right ventricle              Value     Reference TAPSE                   14.6 mm    ---------  Legend: (L) and (H) mark values outside specified reference range.  ------------------------------------------------------------------- Prepared and Electronically Authenticated by  Marca Ancona, M.D. 2016-09-02T16:59:22            Impression:   He has severe multivessel coronary artery disease with mild LV dysfunction and moderate to severe aortic stenosis with progressive shortness of breath, fatigue and chest pain that he is treating with multiple NTG per day. I suspect that he may have low gradient, low EF severe AS based on his echo.  He is 89 but still very functional until the past 6 months when he has been extremely limited by these symptoms. He is at increased risk for CABG and AVR but I think it is still the best treatment for him. I discussed the operative procedure with the patient and family including alternatives, benefits and  risks; including but not limited to bleeding, blood transfusion, infection, stroke, myocardial infarction, graft failure, heart block requiring a permanent pacemaker, organ dysfunction, and death.  [name] understands and agrees to proceed.     Plan:   CABG and AVR    [name], MD [facility] [phone]

## 2014-10-29 NOTE — Progress Notes (Signed)
Patients CT output 150 in 25 minutes, Dr. Dorris Fetch paged, orders to give 1 RBC, 2 FFPs, 8 of peep on vent.  Hermina Barters, RN

## 2014-10-30 ENCOUNTER — Inpatient Hospital Stay (HOSPITAL_COMMUNITY): Payer: Medicare Other

## 2014-10-30 ENCOUNTER — Encounter (HOSPITAL_COMMUNITY): Payer: Self-pay | Admitting: Surgery

## 2014-10-30 LAB — BASIC METABOLIC PANEL
ANION GAP: 6 (ref 5–15)
ANION GAP: 7 (ref 5–15)
BUN: 16 mg/dL (ref 6–20)
BUN: 17 mg/dL (ref 6–20)
CALCIUM: 8.4 mg/dL — AB (ref 8.9–10.3)
CHLORIDE: 107 mmol/L (ref 101–111)
CHLORIDE: 105 mmol/L (ref 101–111)
CO2: 24 mmol/L (ref 22–32)
CO2: 25 mmol/L (ref 22–32)
Calcium: 8.4 mg/dL — ABNORMAL LOW (ref 8.9–10.3)
Creatinine, Ser: 1.03 mg/dL (ref 0.61–1.24)
Creatinine, Ser: 1.04 mg/dL (ref 0.61–1.24)
GFR calc Af Amer: >60 mL/min (ref >60)
GFR calc non Af Amer: >60 mL/min (ref >60)
Glucose, Bld: 109 mg/dL — ABNORMAL HIGH (ref 65–99)
Glucose, Bld: 126 mg/dL — ABNORMAL HIGH (ref 65–99)
POTASSIUM: 4.3 mmol/L (ref 3.5–5.1)
Potassium: 4.3 mmol/L (ref 3.5–5.1)
SODIUM: 138 mmol/L (ref 135–145)
SODIUM: 136 mmol/L (ref 135–145)

## 2014-10-30 LAB — GLUCOSE, CAPILLARY
GLUCOSE-CAPILLARY: 110 mg/dL — AB (ref 65–99)
GLUCOSE-CAPILLARY: 114 mg/dL — AB (ref 65–99)
GLUCOSE-CAPILLARY: 120 mg/dL — AB (ref 65–99)
GLUCOSE-CAPILLARY: 126 mg/dL — AB (ref 65–99)
GLUCOSE-CAPILLARY: 132 mg/dL — AB (ref 65–99)
GLUCOSE-CAPILLARY: 133 mg/dL — AB (ref 65–99)
GLUCOSE-CAPILLARY: 134 mg/dL — AB (ref 65–99)
GLUCOSE-CAPILLARY: 135 mg/dL — AB (ref 65–99)
GLUCOSE-CAPILLARY: 137 mg/dL — AB (ref 65–99)
GLUCOSE-CAPILLARY: 138 mg/dL — AB (ref 65–99)
GLUCOSE-CAPILLARY: 139 mg/dL — AB (ref 65–99)
GLUCOSE-CAPILLARY: 97 mg/dL (ref 65–99)
GLUCOSE-CAPILLARY: 97 mg/dL (ref 65–99)
Glucose-Capillary: 93 mg/dL (ref 65–99)
Glucose-Capillary: 123 mg/dL — ABNORMAL HIGH (ref 65–99)
Glucose-Capillary: 127 mg/dL — ABNORMAL HIGH (ref 65–99)
Glucose-Capillary: 121 mg/dL — ABNORMAL HIGH (ref 65–99)
Glucose-Capillary: 123 mg/dL — ABNORMAL HIGH (ref 65–99)
Glucose-Capillary: 125 mg/dL — ABNORMAL HIGH (ref 65–99)
Glucose-Capillary: 153 mg/dL — ABNORMAL HIGH (ref 65–99)
Glucose-Capillary: 151 mg/dL — ABNORMAL HIGH (ref 65–99)
Glucose-Capillary: 143 mg/dL — ABNORMAL HIGH (ref 65–99)

## 2014-10-30 LAB — CBC
HCT: 22.9 % — ABNORMAL LOW (ref 39.0–52.0)
HEMATOCRIT: 25 % — AB (ref 39.0–52.0)
HEMATOCRIT: 25.8 % — AB (ref 39.0–52.0)
HEMOGLOBIN: 9 g/dL — AB (ref 13.0–17.0)
HEMOGLOBIN: 8.2 g/dL — AB (ref 13.0–17.0)
Hemoglobin: 9.1 g/dL — ABNORMAL LOW (ref 13.0–17.0)
MCH: 30.2 pg (ref 26.0–34.0)
MCH: 29.4 pg (ref 26.0–34.0)
MCH: 30.5 pg (ref 26.0–34.0)
MCHC: 35.3 g/dL (ref 30.0–36.0)
MCHC: 35.8 g/dL (ref 30.0–36.0)
MCHC: 36 g/dL (ref 30.0–36.0)
MCV: 83.5 fL (ref 78.0–100.0)
MCV: 83.9 fL (ref 78.0–100.0)
MCV: 85.1 fL (ref 78.0–100.0)
PLATELETS: 103 10*3/uL — AB (ref 150–400)
PLATELETS: 107 10*3/uL — AB (ref 150–400)
Platelets: 96 10*3/uL — ABNORMAL LOW (ref 150–400)
RBC: 2.69 MIL/uL — AB (ref 4.22–5.81)
RBC: 2.98 MIL/uL — ABNORMAL LOW (ref 4.22–5.81)
RBC: 3.09 MIL/uL — AB (ref 4.22–5.81)
RDW: 14 % (ref 11.5–15.5)
RDW: 14.7 % (ref 11.5–15.5)
RDW: 15.3 % (ref 11.5–15.5)
WBC: 13.7 10*3/uL — AB (ref 4.0–10.5)
WBC: 9.1 10*3/uL (ref 4.0–10.5)
WBC: 9.4 10*3/uL (ref 4.0–10.5)

## 2014-10-30 LAB — POCT I-STAT 7, (LYTES, BLD GAS, ICA,H+H)
Acid-Base Excess: 2 mmol/L (ref 0.0–2.0)
Bicarbonate: 27.1 mEq/L — ABNORMAL HIGH (ref 20.0–24.0)
Calcium, Ion: 1.21 mmol/L (ref 1.13–1.30)
HEMATOCRIT: 21 % — AB (ref 39.0–52.0)
HEMOGLOBIN: 7.1 g/dL — AB (ref 13.0–17.0)
O2 SAT: 100 %
PCO2 ART: 46.5 mmHg — AB (ref 35.0–45.0)
PO2 ART: 333 mmHg — AB (ref 80.0–100.0)
POTASSIUM: 4.2 mmol/L (ref 3.5–5.1)
Sodium: 139 mmol/L (ref 135–145)
TCO2: 29 mmol/L (ref 0–100)
pH, Arterial: 7.374 (ref 7.350–7.450)

## 2014-10-30 LAB — PREPARE PLATELET PHERESIS
UNIT DIVISION: 0
UNIT DIVISION: 0

## 2014-10-30 LAB — CREATININE, SERUM
Creatinine, Ser: 1.39 mg/dL — ABNORMAL HIGH (ref 0.61–1.24)
GFR, EST AFRICAN AMERICAN: 52 mL/min — AB (ref >60)
GFR, EST NON AFRICAN AMERICAN: 45 mL/min — AB (ref >60)

## 2014-10-30 LAB — MAGNESIUM
MAGNESIUM: 1.8 mg/dL (ref 1.7–2.4)
MAGNESIUM: 1.8 mg/dL (ref 1.7–2.4)

## 2014-10-30 LAB — PREPARE FRESH FROZEN PLASMA
UNIT DIVISION: 0
UNIT DIVISION: 0
UNIT DIVISION: 0
Unit division: 0

## 2014-10-30 LAB — POCT I-STAT, CHEM 8
BUN: 20 mg/dL (ref 6–20)
CALCIUM ION: 1.21 mmol/L (ref 1.13–1.30)
CHLORIDE: 104 mmol/L (ref 101–111)
CREATININE: 1.3 mg/dL — AB (ref 0.61–1.24)
GLUCOSE: 121 mg/dL — AB (ref 65–99)
HCT: 24 % — ABNORMAL LOW (ref 39.0–52.0)
Hemoglobin: 8.2 g/dL — ABNORMAL LOW (ref 13.0–17.0)
Potassium: 4.4 mmol/L (ref 3.5–5.1)
Sodium: 138 mmol/L (ref 135–145)
TCO2: 20 mmol/L (ref 0–100)

## 2014-10-30 LAB — POCT I-STAT 3, ART BLOOD GAS (G3+)
Acid-base deficit: 1 mmol/L (ref 0.0–2.0)
Acid-base deficit: 1 mmol/L (ref 0.0–2.0)
BICARBONATE: 23.1 meq/L (ref 20.0–24.0)
Bicarbonate: 23.5 mEq/L (ref 20.0–24.0)
O2 SAT: 97 %
O2 Saturation: 97 %
PCO2 ART: 35.1 mmHg (ref 35.0–45.0)
PCO2 ART: 35 mmHg (ref 35.0–45.0)
PH ART: 7.427 (ref 7.350–7.450)
PO2 ART: 91 mmHg (ref 80.0–100.0)
Patient temperature: 37
TCO2: 25 mmol/L (ref 0–100)
TCO2: 24 mmol/L (ref 0–100)
pH, Arterial: 7.434 (ref 7.350–7.450)
pO2, Arterial: 90 mmHg (ref 80.0–100.0)

## 2014-10-30 LAB — POCT I-STAT GLUCOSE
GLUCOSE: 121 mg/dL — AB (ref 65–99)
OPERATOR ID: 3293

## 2014-10-30 LAB — PREPARE RBC (CROSSMATCH)

## 2014-10-30 MED ORDER — INSULIN ASPART 100 UNIT/ML ~~LOC~~ SOLN
0.0000 [IU] | SUBCUTANEOUS | Status: DC
  Administered 2014-10-31 – 2014-11-03 (×10): 2 [IU] via SUBCUTANEOUS
  Administered 2014-11-03 – 2014-11-04 (×2): 4 [IU] via SUBCUTANEOUS

## 2014-10-30 MED ORDER — INSULIN DETEMIR 100 UNIT/ML ~~LOC~~ SOLN
15.0000 [IU] | Freq: Every day | SUBCUTANEOUS | Status: DC
Start: 2014-10-31 — End: 2014-11-06
  Administered 2014-10-31 – 2014-11-05 (×6): 15 [IU] via SUBCUTANEOUS
  Filled 2014-10-30 (×7): qty 0.15

## 2014-10-30 MED ORDER — INSULIN DETEMIR 100 UNIT/ML ~~LOC~~ SOLN
15.0000 [IU] | Freq: Once | SUBCUTANEOUS | Status: AC
  Administered 2014-10-30: 15 [IU] via SUBCUTANEOUS
  Filled 2014-10-30: qty 0.15

## 2014-10-30 MED ORDER — SODIUM CHLORIDE 0.9 % IV SOLN
Freq: Once | INTRAVENOUS | Status: AC
  Administered 2014-10-30: 01:00:00 via INTRAVENOUS

## 2014-10-30 MED FILL — Sodium Chloride IV Soln 0.9%: INTRAVENOUS | Qty: 3000 | Status: AC

## 2014-10-30 MED FILL — Heparin Sodium (Porcine) Inj 1000 Unit/ML: INTRAMUSCULAR | Qty: 30 | Status: AC

## 2014-10-30 NOTE — Progress Notes (Signed)
Patient ID: Frank Marks, male   DOB: 05-06-1926, 81 y.o.   MRN: [id number] EVENING ROUNDS NOTE :     [address]       [place] [address]             [phone]                 1 Day Post-Op Procedure(s) (LRB): EXPLORATION POST OPERATIVE OPEN HEART (N/A)  Total Length of Stay:  LOS: 1 day  BP 109/67 mmHg  Pulse 102  Temp(Src) 98.8 F (37.1 C) (Core (Comment))  Resp 14  Wt 224 lb 3.3 oz (101.7 kg)  SpO2 100%  .Intake/Output      09/15 0701 - 09/16 0700 09/16 0701 - 09/17 0700   I.V. (mL/kg) 3587.4 (35.3) 826.4 (8.1)   Blood 3541    NG/GT 30 30   IV Piggyback 1750 50   Total Intake(mL/kg) 8908.4 (87.6) 906.4 (8.9)   Urine (mL/kg/hr) 3025 (1.2) 375 (0.3)   Emesis/NG output 100 (0)    Blood 1945 (0.8)    Chest Tube 1670 (0.7) 250 (0.2)   Total Output 6740 625   Net +2168.4 +281.4          . sodium chloride 20 mL/hr at 10/30/14 1600  . sodium chloride 250 mL (10/30/14 0505)  . sodium chloride 20 mL/hr at 10/30/14 1600  . dexmedetomidine Stopped (10/30/14 0215)  . DOPamine Stopped (10/30/14 1600)  . lactated ringers 20 mL/hr at 10/30/14 1600  . lactated ringers    . nitroGLYCERIN Stopped (10/30/14 1300)  . phenylephrine (NEO-SYNEPHRINE) Adult infusion Stopped (10/30/14 1600)     Lab Results  Component Value Date   WBC 13.7* 10/30/2014   HGB 9.1* 10/30/2014   HCT 25.8* 10/30/2014   PLT 103* 10/30/2014   GLUCOSE 121* 10/30/2014   CHOL * 06/16/2007    248        ATP III CLASSIFICATION:  <200     mg/dL   Desirable  474-259  mg/dL   Borderline High  >=563    mg/dL   High   TRIG 875* 64/33/2951   HDL 30* 06/16/2007   LDLCALC * 06/16/2007    152        Total Cholesterol/HDL:CHD Risk Coronary Heart Disease Risk Table                     Men   Women  1/2 Average Risk   3.4   3.3   ALT 14* 10/21/2014   AST 20 10/21/2014   NA 138 10/30/2014   K 4.4 10/30/2014   CL 104 10/30/2014   CREATININE 1.39* 10/30/2014   BUN 20 10/30/2014   CO2  25 10/30/2014   INR 1.32 10/29/2014   HGBA1C 6.5* 10/21/2014   Now extubated Stable Not bleeding  Delight Ovens MD  Beeper [phone] Office [phone] 10/30/2014 6:52 PM

## 2014-10-30 NOTE — Progress Notes (Signed)
UOP low at 15cc/hr for 3 hours, Dr Tyrone Sage paged, orders received to re-start Dopamine gtt at 21mcg/kg/min

## 2014-10-30 NOTE — Progress Notes (Signed)
Subjective:  Intubated but opens eyes and follows some commands. Twitching of arms may be related to anesthetic agents still present.   Objective: Vital signs in last 24 hours: Temp:  [95 F (35 C)-99.1 F (37.3 C)] 98.6 F (37 C) (09/16 0800) Pulse Rate:  [74-236] 236 (09/16 0800) Cardiac Rhythm:  [-] A-V Sequential paced (09/16 0800) Resp:  [0-24] 22 (09/16 0800) BP: (67-128)/(34-70) 121/67 mmHg (09/16 0800) SpO2:  [94 %-100 %] 100 % (09/16 0800) Arterial Line BP: (75-187)/(40-75) 129/66 mmHg (09/16 0800) FiO2 (%):  [50 %] 50 % (09/16 0800) Weight:  [101.7 kg (224 lb 3.3 oz)] 101.7 kg (224 lb 3.3 oz) (09/16 0434)  Hemodynamic parameters for last 24 hours: PAP: (23-43)/(5-29) 39/23 mmHg CO:  [3.2 L/min-4.8 L/min] 4 L/min CI:  [1.5 L/min/m2-2.2 L/min/m2] 1.8 L/min/m2  Intake/Output from previous day: 09/15 0701 - 09/16 0700 In: 8908.4 [I.V.:3587.4; Blood:3541; NG/GT:30; IV Piggyback:1750] Out: 6740 [Urine:3025; Emesis/NG output:100; Blood:1945; Chest Tube:1670] Intake/Output this shift: Total I/O In: 120.6 [I.V.:90.6; NG/GT:30] Out: 105 [Urine:75; Chest Tube:30]  General appearance: slowed mentation Neurologic: intact and still acts narcotized Heart: regular rate and rhythm, S1, S2 normal, no murmur, click, rub or gallop Lungs: clear to auscultation bilaterally Abdomen: soft, non-tender; bowel sounds normal; no masses,  no organomegaly Extremities: edema moderate Wound: dressings dry  Lab Results:  Recent Labs  10/29/14 2345 10/30/14 0505  WBC 9.1 9.4  HGB 8.2* 9.0*  HCT 22.9* 25.0*  PLT 107* 96*   BMET:  Recent Labs  10/29/14 2345 10/30/14 0505  NA 136 138  K 4.3 4.3  CL 105 107  CO2 24 25  GLUCOSE 109* 126*  BUN 17 16  CREATININE 1.04 1.03  CALCIUM 8.4* 8.4*    PT/INR:  Recent Labs  10/29/14 2215  LABPROT 16.5*  INR 1.32   ABG    Component Value Date/Time   PHART 7.475* 10/29/2014 2346   HCO3 24.6* 10/29/2014 2346   TCO2 26 10/29/2014  2346   ACIDBASEDEF 5.0* 10/29/2014 1441   O2SAT 98.0 10/29/2014 2346   CBG (last 3)   Recent Labs  10/29/14 1857 10/29/14 2059 10/30/14 0012  GLUCAP 127* 121* 97   CXR: bibasilar atelectasis.  ECG: sinus, no acute changes  Assessment/Plan:  He is hemodynamically stable. Wean to extubate as he wakes up more.  Chronic atrial fib on Pradaxa. Now in sinus rhythm postop. Will need to use coumadin instead of Pradaxa with a valve in place.   Chronic kidney disease: creat is stable so far. His creatinine was normal preop but was up to 1.7 on 9/7 possibly due to dehydration. Continue low dose dopamine and follow.   Volume excess. Diurese once stable off the vent.  DM: start Levemir and SSI.      LOS: 1 day    [name] 10/30/2014

## 2014-10-30 NOTE — Progress Notes (Signed)
RT decreased PEEP back to 5 per MD order.

## 2014-10-30 NOTE — Procedures (Signed)
Extubation Procedure Note  Patient Details:   Name: Frank Marks DOB: 1926-02-28 MRN: [id number]   Airway Documentation:     Evaluation  O2 sats: stable throughout Complications: No apparent complications Patient did tolerate procedure well. Bilateral Breath Sounds: Rhonchi Suctioning: Oral, Airway Yes   Patient extubated to 2L nasal cannula per rapid wean protocol.  Positive cuff leak noted.  No evidence of stridor.  Patient able to speak post extubation.  Sats currently 97%.  Vitals are stable.  Incentive spirometry performed, with RN assistance.  No apparent complications.   Durwin Glaze 10/30/2014, 10:31 AM

## 2014-10-30 NOTE — Progress Notes (Signed)
RR decreased to 12 per MD order 

## 2014-10-30 NOTE — Anesthesia Postprocedure Evaluation (Signed)
  Anesthesia Post-op Note  Patient: Frank Marks  Procedure(s) Performed: Procedure(s): EXPLORATION POST OPERATIVE OPEN HEART (N/A)  Patient Location: ICU  Anesthesia Type:General  Level of Consciousness: Patient remains intubated per anesthesia plan  Airway and Oxygen Therapy: Patient remains intubated per anesthesia plan  Post-op Pain: Unable to assess due to sedation  Post-op Assessment: Post-op Vital signs reviewed and Patient's Cardiovascular Status Stable              Post-op Vital Signs: Reviewed and stable  Last Vitals:  Filed Vitals:   10/30/14 0615  BP:   Pulse:   Temp: 37.2 C  Resp: 14    Complications: No apparent anesthesia complications, continue mechanical ventilation

## 2014-10-30 NOTE — Progress Notes (Addendum)
Dr Laneta Simmers made aware of low CI of 1.6, PADs 21-25, all other vitals stable. Updated on CT output.  No new orders at this time.

## 2014-10-30 NOTE — Anesthesia Postprocedure Evaluation (Signed)
  Anesthesia Post-op Note  Patient: Frank Marks  Procedure(s) Performed: Procedure(s): CORONARY ARTERY BYPASS GRAFTING (CABG) (N/A) AORTIC VALVE REPLACEMENT (AVR) (N/A) TRANSESOPHAGEAL ECHOCARDIOGRAM (TEE) (N/A)  Patient Location: ICU  Anesthesia Type:General  Level of Consciousness: sedated  Airway and Oxygen Therapy: Patient remains intubated per anesthesia plan  Post-op Pain: none  Post-op Assessment: Post-op Vital signs reviewed, Patient's Cardiovascular Status Stable, Respiratory Function Stable, Patent Airway and Pain level controlled              Post-op Vital Signs: Reviewed and stable  Last Vitals:  Filed Vitals:   10/30/14 1330  BP:   Pulse: 96  Temp: 37.1 C  Resp: 0    Complications: No apparent anesthesia complications

## 2014-10-30 NOTE — Op Note (Signed)
10/30/2014 [name] [id number]  Surgeon: [name], MD   First Assistant: Frutoso Schatz, RNFA  Preoperative Diagnosis: mediastinal bleeding s/p CABG and AVR  Postoperative Diagnosis: Same   Procedure:  1.  Exploration of mediastinum and ligation of sternal bleeding site  Anesthesia: General Endotracheal   Clinical History/Surgical Indication:   The patient is an 81 year old gentleman who underwent AVR and CABG earlier in the day. Hemostasis appeared adequate postop but he had been on Pradaxa and was oozy. He was given platelets for thrombocytopenia. He started having increased chest tube output a few hours postop that persisted despite correction of his coagulopathy. I felt that it would be best to return to the OR for exploration. I discussed this with his son [name] by telephone and he agreed.   Preparation:  The patient was taken directly to the operating room. Preoperative antibiotics were given. AThe patient was positioned supine on the operating room table. After being placed under general endotracheal anesthesia by the anesthesia team the neck, chest, abdomen were prepped with betadine soap and solution and draped in the usual sterile manner. The chest tubes were left in place. A surgical time-out was taken and the correct patient and operative procedure were confirmed with the nursing and anesthesia staff.   Median Sternotomy:   The previous median sternotomy was opened. Upon separating the sternum there was a moderate amount of fresh blood and clots. There was obvious bleeding from the right posterior edge of the sternum. I tried to cauterize this but it would not stop and therefore a figure of eight suture was placed around the sternum to ligate this bleeding. The aortotomy, cannulation sites and graft anastomoses were hemostatic. The mediastinum was irrigated with saline. There was complete hemostasis.   Completion:   Mediastinal drainage tubes were declotted and  returned to their normal position. The sternum was closed with double #6 stainless steel wires. The fascia was closed with continuous # 1 vicryl suture. The subcutaneous tissue was closed with 2-0 vicryl continuous suture. The skin was closed with 3-0 vicryl subcuticular suture. All sponge, needle, and instrument counts were reported correct at the end of the case. Dry sterile dressings were placed over the incisions and around the chest tubes which were connected to pleurevac suction. The patient was then transported to the surgical intensive care unit in satisfactory and stable condition.

## 2014-10-31 ENCOUNTER — Inpatient Hospital Stay (HOSPITAL_COMMUNITY): Payer: Medicare Other

## 2014-10-31 LAB — CBC
HEMATOCRIT: 25.9 % — AB (ref 39.0–52.0)
Hemoglobin: 8.9 g/dL — ABNORMAL LOW (ref 13.0–17.0)
MCH: 29.4 pg (ref 26.0–34.0)
MCHC: 34.4 g/dL (ref 30.0–36.0)
MCV: 85.5 fL (ref 78.0–100.0)
Platelets: 102 10*3/uL — ABNORMAL LOW (ref 150–400)
RBC: 3.03 MIL/uL — AB (ref 4.22–5.81)
RDW: 15.6 % — ABNORMAL HIGH (ref 11.5–15.5)
WBC: 11.3 10*3/uL — AB (ref 4.0–10.5)

## 2014-10-31 LAB — GLUCOSE, CAPILLARY
GLUCOSE-CAPILLARY: 113 mg/dL — AB (ref 65–99)
GLUCOSE-CAPILLARY: 148 mg/dL — AB (ref 65–99)
GLUCOSE-CAPILLARY: 136 mg/dL — AB (ref 65–99)
GLUCOSE-CAPILLARY: 136 mg/dL — AB (ref 65–99)
Glucose-Capillary: 115 mg/dL — ABNORMAL HIGH (ref 65–99)
Glucose-Capillary: 143 mg/dL — ABNORMAL HIGH (ref 65–99)
Glucose-Capillary: 123 mg/dL — ABNORMAL HIGH (ref 65–99)

## 2014-10-31 LAB — BASIC METABOLIC PANEL
ANION GAP: 7 (ref 5–15)
BUN: 24 mg/dL — ABNORMAL HIGH (ref 6–20)
CHLORIDE: 103 mmol/L (ref 101–111)
CO2: 25 mmol/L (ref 22–32)
Calcium: 8.4 mg/dL — ABNORMAL LOW (ref 8.9–10.3)
Creatinine, Ser: 1.53 mg/dL — ABNORMAL HIGH (ref 0.61–1.24)
GFR, EST AFRICAN AMERICAN: 46 mL/min — AB (ref >60)
GFR, EST NON AFRICAN AMERICAN: 40 mL/min — AB (ref >60)
Glucose, Bld: 195 mg/dL — ABNORMAL HIGH (ref 65–99)
POTASSIUM: 4.7 mmol/L (ref 3.5–5.1)
SODIUM: 135 mmol/L (ref 135–145)

## 2014-10-31 MED ORDER — AMIODARONE HCL 200 MG PO TABS: 200.0000 mg | ORAL_TABLET | Freq: Every day | ORAL | Status: DC

## 2014-10-31 MED ORDER — AMIODARONE HCL IN DEXTROSE 360-4.14 MG/200ML-% IV SOLN
30.0000 mg/h | INTRAVENOUS | Status: DC
Start: 2014-10-31 — End: 2014-11-02
  Administered 2014-10-31 – 2014-11-02 (×3): 30 mg/h via INTRAVENOUS
  Filled 2014-10-31 (×7): qty 200

## 2014-10-31 MED ORDER — TRAMADOL HCL 50 MG PO TABS: 50.0000 mg | ORAL_TABLET | ORAL | Status: DC | PRN

## 2014-10-31 MED ORDER — AMIODARONE LOAD VIA INFUSION
150.0000 mg | Freq: Once | INTRAVENOUS | Status: AC
  Administered 2014-10-31: 150 mg via INTRAVENOUS
  Filled 2014-10-31: qty 83.34

## 2014-10-31 MED ORDER — AMIODARONE HCL IN DEXTROSE 360-4.14 MG/200ML-% IV SOLN
60.0000 mg/h | INTRAVENOUS | Status: AC
  Administered 2014-10-31 (×2): 60 mg/h via INTRAVENOUS
  Filled 2014-10-31 (×2): qty 200

## 2014-10-31 MED ORDER — OXYCODONE HCL 5 MG PO TABS: 5.0000 mg | ORAL_TABLET | ORAL | Status: DC | PRN

## 2014-10-31 MED ORDER — AMIODARONE HCL 200 MG PO TABS
200.0000 mg | ORAL_TABLET | Freq: Two times a day (BID) | ORAL | Status: DC
  Filled 2014-10-31 (×3): qty 1

## 2014-10-31 NOTE — Progress Notes (Addendum)
Pt confused, agitated, and paranoid. Both sons at bedside. Pt refusing all medications, to wear SpO2 monitor, tempeture checks, CBG checks/insulin, and to be turned/repositioned.   Nurse monitoring patient closely attempting non-pharmacological calming interventions and de escalation of agitation and paranoia per Dr Tyrone Sage.

## 2014-10-31 NOTE — Progress Notes (Signed)
Patient ID: Frank Marks, male   DOB: 06-15-1926, 81 y.o.   MRN: [id number] [facility] DAILY ICU PROGRESS NOTE                   [address]            [place] [address]          [phone]   2 Days Post-Op Procedure(s) (LRB): EXPLORATION POST OPERATIVE OPEN HEART (N/A)  Total Length of Stay:  LOS: 2 days   Subjective: Hard of hearing esp right ear. Alert complaining of heartburn  Objective: Vital signs in last 24 hours: Temp:  [98 F (36.7 C)-98.8 F (37.1 C)] 98 F (36.7 C) (09/17 0749) Pulse Rate:  [44-129] 129 (09/17 0900) Cardiac Rhythm:  [-] Normal sinus rhythm (09/17 0900) Resp:  [0-25] 17 (09/17 0900) BP: (79-159)/(19-92) 125/58 mmHg (09/17 0900) SpO2:  [91 %-100 %] 100 % (09/17 0900) Arterial Line BP: (88-249)/(34-242) 111/47 mmHg (09/16 1615) Weight:  [221 lb 1.9 oz (100.3 kg)] 221 lb 1.9 oz (100.3 kg) (09/17 0611)  Filed Weights   10/30/14 0434 10/31/14 0500 10/31/14 0611  Weight: 224 lb 3.3 oz (101.7 kg) 221 lb 1.9 oz (100.3 kg) 221 lb 1.9 oz (100.3 kg)    Weight change: -3 lb 1.4 oz (-1.4 kg)   Hemodynamic parameters for last 24 hours: PAP: (25-39)/(11-20) 29/14 mmHg CO:  [4.8 L/min] 4.8 L/min CI:  [2.2 L/min/m2] 2.2 L/min/m2  Intake/Output from previous day: 09/16 0701 - 09/17 0700 In: 1258.8 [I.V.:1128.8; NG/GT:30; IV Piggyback:100] Out: 1330 [Urine:680; Chest Tube:650]  Intake/Output this shift: Total I/O In: 50.6 [I.V.:50.6] Out: -   Current Meds: Scheduled Meds: . sodium chloride  10 mL/hr Intravenous Once  . acetaminophen  1,000 mg Oral 4 times per day   Or  . acetaminophen (TYLENOL) oral liquid 160 mg/5 mL  1,000 mg Per Tube 4 times per day  . antiseptic oral rinse  7 mL Mouth Rinse q12n4p  . bisacodyl  10 mg Oral Daily   Or  . bisacodyl  10 mg Rectal Daily  . chlorhexidine  15 mL Mouth Rinse BID  . Chlorhexidine Gluconate Cloth  6 each Topical Q0600  . docusate sodium  200 mg Oral Daily  . insulin aspart  0-24  Units Subcutaneous 6 times per day  . insulin detemir  15 Units Subcutaneous Daily  . metoprolol tartrate  12.5 mg Oral BID   Or  . metoprolol tartrate  12.5 mg Per Tube BID  . mupirocin ointment  1 application Nasal BID  . pantoprazole  40 mg Oral Daily  . pravastatin  80 mg Oral QHS  . sodium chloride  3 mL Intravenous Q12H   Continuous Infusions: . sodium chloride 20 mL/hr at 10/30/14 1600  . sodium chloride 250 mL (10/30/14 0505)  . sodium chloride 20 mL/hr at 10/31/14 0700  . dexmedetomidine Stopped (10/30/14 0215)  . DOPamine 3 mcg/kg/min (10/31/14 0900)  . lactated ringers 20 mL/hr at 10/30/14 2000  . lactated ringers    . nitroGLYCERIN Stopped (10/30/14 1300)  . phenylephrine (NEO-SYNEPHRINE) Adult infusion Stopped (10/30/14 1600)   PRN Meds:.sodium chloride, metoprolol, midazolam, morphine injection, ondansetron (ZOFRAN) IV, oxyCODONE, sodium chloride, traMADol  General appearance: alert and cooperative Neurologic: intact Heart: irregularly irregular rhythm Lungs: diminished breath sounds bibasilar Abdomen: soft, non-tender; bowel sounds normal; no masses,  no organomegaly Extremities: extremities normal, atraumatic, no cyanosis or edema and Homans sign is negative, no sign of DVT Wound:  sternum stable  Lab Results: CBC: Recent Labs  10/30/14 1630 10/31/14 0420  WBC 13.7* 11.3*  HGB 9.1* 8.9*  HCT 25.8* 25.9*  PLT 103* 102*   BMET:  Recent Labs  10/30/14 0505 10/30/14 1626 10/30/14 1630 10/31/14 0420  NA 138 138  --  135  K 4.3 4.4  --  4.7  CL 107 104  --  103  CO2 25  --   --  25  GLUCOSE 126* 121*  --  195*  BUN 16 20  --  24*  CREATININE 1.03 1.30* 1.39* 1.53*  CALCIUM 8.4*  --   --  8.4*    PT/INR:  Recent Labs  10/29/14 2215  LABPROT 16.5*  INR 1.32   Radiology: Dg Chest Port 1 View  10/31/2014   CLINICAL DATA:  Postop CABG.  EXAM: PORTABLE CHEST - 1 VIEW  COMPARISON:  10/30/2014 and 10/29/2014.  FINDINGS: 0614 hours. Interval  extubation with removal of the nasogastric tube and right IJ Swan-Ganz catheter. Right IJ sheath remains in place. There is a mediastinal drain and inferior left chest tube. Bibasilar atelectasis and probable small pleural effusions persist. There is no pneumothorax. The heart size and mediastinal contours are stable status post median sternotomy, CABG and valve replacement.  IMPRESSION: Stable bibasilar atelectasis and probable pleural effusions following extubation. No pneumothorax.   Electronically Signed   By: Carey Bullocks M.D.   On: 10/31/2014 09:10   Acute Kidney Injury (any one)  Increase in SCr by > 0.3 within 48 hours  Increase SCr to > 1.5 times baseline  Urine volume < 0.5 ml/kg/h for 6 hrs  Stage:  Risk:   1.5x increase in creatinine or GFR decrease by 25% or UOP <0.14ml/kgperhr for 6 hrs  Injury:  2x increase in creatinine or GFR decrease by 50% or UOP < 0.41ml/kgperhr for 12 hr  Failure:3X increase in creatinine or GFR decrease by 75% or UOP < 0.96ml/kgperhr for 12 hr or                anuria 12 hrs  Loss: complete loss of kidney  function for more then 4 weeks  End-stage renal disease:Complete loss of kidney function for more then 3 months  Lab Results  Component Value Date   CREATININE 1.53* 10/31/2014   CREATININE: 1.53 mg/dL ABNORMAL (16/10/96 0454) Estimated creatinine clearance - 44 mL/min   Assessment/Plan: S/P Procedure(s) (LRB): EXPLORATION POST OPERATIVE OPEN HEART (N/A) Mobilize Diuresis Diabetes control Now rapid afib, start Cordarone Acute kidney injury "risk" stage- on dopamine 400 ml from chest tubes past 12 hours, leave until later today before removal    Delight Ovens 10/31/2014 10:04 AM

## 2014-10-31 NOTE — Progress Notes (Signed)
Patient ID: Frank Marks, male   DOB: 08/10/1926, 81 y.o.   MRN: [id number] EVENING ROUNDS NOTE :     [address]       [place], [address]             [phone]                 2 Days Post-Op Procedure(s) (LRB): EXPLORATION POST OPERATIVE OPEN HEART (N/A)  Total Length of Stay:  LOS: 2 days  BP 162/96 mmHg  Pulse 67  Temp(Src) 97.2 F (36.2 C) (Oral)  Resp 11  Ht  (1.854 m)  Wt 221 lb 1.9 oz (100.3 kg)  BMI 29.18 kg/m2  SpO2 96%  .Intake/Output      09/17 0701 - 09/18 0700   P.O. 200   I.V. (mL/kg) 513.8 (5.1)   NG/GT    IV Piggyback    Total Intake(mL/kg) 713.8 (7.1)   Urine (mL/kg/hr) 370 (0.3)   Chest Tube 600 (0.5)   Total Output 970   Net -256.2         . sodium chloride 20 mL/hr at 10/30/14 1600  . sodium chloride 250 mL (10/30/14 0505)  . sodium chloride 20 mL/hr at 10/31/14 0700  . amiodarone 30 mg/hr (10/31/14 1615)  . dexmedetomidine Stopped (10/30/14 0215)  . DOPamine 3 mcg/kg/min (10/31/14 1800)  . lactated ringers 20 mL/hr at 10/30/14 2000  . lactated ringers    . nitroGLYCERIN Stopped (10/30/14 1300)  . phenylephrine (NEO-SYNEPHRINE) Adult infusion Stopped (10/30/14 1600)     Lab Results  Component Value Date   WBC 11.3* 10/31/2014   HGB 8.9* 10/31/2014   HCT 25.9* 10/31/2014   PLT 102* 10/31/2014   GLUCOSE 195* 10/31/2014   CHOL * 06/16/2007    248        ATP III CLASSIFICATION:  <200     mg/dL   Desirable  595-638  mg/dL   Borderline High  >=756    mg/dL   High   TRIG 433* 29/51/8841   HDL 30* 06/16/2007   LDLCALC * 06/16/2007    152        Total Cholesterol/HDL:CHD Risk Coronary Heart Disease Risk Table                     Men   Women  1/2 Average Risk   3.4   3.3   ALT 14* 10/21/2014   AST 20 10/21/2014   NA 135 10/31/2014   K 4.7 10/31/2014   CL 103 10/31/2014   CREATININE 1.53* 10/31/2014   BUN 24* 10/31/2014   CO2 25 10/31/2014   INR 1.32 10/29/2014   HGBA1C 6.5* 10/21/2014   Still in a  fib on Cordarone drip Increasing confused  Will hold oxyir  and decrease dose of Adline Mango MD  Beeper [phone] Office [phone] 10/31/2014 7:02 PM

## 2014-11-01 ENCOUNTER — Inpatient Hospital Stay (HOSPITAL_COMMUNITY): Payer: Medicare Other

## 2014-11-01 LAB — BASIC METABOLIC PANEL
Anion gap: 9 (ref 5–15)
BUN: 25 mg/dL — ABNORMAL HIGH (ref 6–20)
CO2: 24 mmol/L (ref 22–32)
Calcium: 8.8 mg/dL — ABNORMAL LOW (ref 8.9–10.3)
Chloride: 103 mmol/L (ref 101–111)
Creatinine, Ser: 1.42 mg/dL — ABNORMAL HIGH (ref 0.61–1.24)
GFR calc Af Amer: 50 mL/min — ABNORMAL LOW (ref >60)
GFR calc non Af Amer: 43 mL/min — ABNORMAL LOW (ref >60)
Glucose, Bld: 144 mg/dL — ABNORMAL HIGH (ref 65–99)
Potassium: 4.1 mmol/L (ref 3.5–5.1)
Sodium: 136 mmol/L (ref 135–145)

## 2014-11-01 LAB — CBC
HCT: 26.8 % — ABNORMAL LOW (ref 39.0–52.0)
Hemoglobin: 9.4 g/dL — ABNORMAL LOW (ref 13.0–17.0)
MCH: 30.2 pg (ref 26.0–34.0)
MCHC: 35.1 g/dL (ref 30.0–36.0)
MCV: 86.2 fL (ref 78.0–100.0)
Platelets: 105 10*3/uL — ABNORMAL LOW (ref 150–400)
RBC: 3.11 MIL/uL — ABNORMAL LOW (ref 4.22–5.81)
RDW: 15.1 % (ref 11.5–15.5)
WBC: 12.4 10*3/uL — ABNORMAL HIGH (ref 4.0–10.5)

## 2014-11-01 LAB — GLUCOSE, CAPILLARY
GLUCOSE-CAPILLARY: 102 mg/dL — AB (ref 65–99)
GLUCOSE-CAPILLARY: 104 mg/dL — AB (ref 65–99)
GLUCOSE-CAPILLARY: 128 mg/dL — AB (ref 65–99)
Glucose-Capillary: 132 mg/dL — ABNORMAL HIGH (ref 65–99)

## 2014-11-01 LAB — HEPATIC FUNCTION PANEL
ALT: 37 U/L (ref 17–63)
AST: 35 U/L (ref 15–41)
Albumin: 2.8 g/dL — ABNORMAL LOW (ref 3.5–5.0)
Alkaline Phosphatase: 57 U/L (ref 38–126)
Bilirubin, Direct: 0.1 mg/dL (ref 0.1–0.5)
Indirect Bilirubin: 0.4 mg/dL (ref 0.3–0.9)
Total Bilirubin: 0.5 mg/dL (ref 0.3–1.2)
Total Protein: 5.6 g/dL — ABNORMAL LOW (ref 6.5–8.1)

## 2014-11-01 LAB — TSH: TSH: 1.84 u[IU]/mL (ref 0.350–4.500)

## 2014-11-01 LAB — MAGNESIUM: Magnesium: 1.7 mg/dL (ref 1.7–2.4)

## 2014-11-01 MED ORDER — HALOPERIDOL LACTATE 5 MG/ML IJ SOLN
2.0000 mg | INTRAMUSCULAR | Status: DC | PRN
  Administered 2014-11-02 (×2): 2 mg via INTRAVENOUS
  Filled 2014-11-01 (×2): qty 1

## 2014-11-01 NOTE — Progress Notes (Addendum)
Pt unable to be calmed with non-pharmacologic interventions (quiet room, lights down, reassurance, repositioning, re-orientation, arrohmatherapy) pt still remains combative, agitated, and paranoid. Pt currently in bilateral soft wrist restraints due to atempting to pull out chest tube, foley, central line, and climb out of bed. Dr Tyrone Sage paged for new orders. RN closely monitoring pt.

## 2014-11-01 NOTE — Progress Notes (Signed)
Patient ID: Frank Marks, male   DOB: [birth year], 81 y.o.   MRN: [id number] TCTS DAILY ICU PROGRESS NOTE                   [address]            [place] [address]          [phone]   3 Days Post-Op Procedure(s) (LRB): EXPLORATION POST OPERATIVE OPEN HEART (N/A)  Total Length of Stay:  LOS: 3 days   Subjective: Follows commands moves all extremities but very confused  Objective: Vital signs in last 24 hours: Temp:  [97.2 F (36.2 C)-98 F (36.7 C)] 97.4 F (36.3 C) (09/18 0814) Pulse Rate:  [27-129] 104 (09/18 0300) Cardiac Rhythm:  [-] Atrial fibrillation (09/18 0700) Resp:  [0-27] 21 (09/18 0700) BP: (93-164)/(53-116) 164/96 mmHg (09/18 0700) SpO2:  [84 %-100 %] 96 % (09/18 0300) Weight:  [219 lb 12.8 oz (99.7 kg)] 219 lb 12.8 oz (99.7 kg) (09/18 0500)  Filed Weights   10/31/14 0500 10/31/14 0611 11/01/14 0500  Weight: 221 lb 1.9 oz (100.3 kg) 221 lb 1.9 oz (100.3 kg) 219 lb 12.8 oz (99.7 kg)    Weight change: -1 lb 5.2 oz (-0.6 kg)   Hemodynamic parameters for last 24 hours:    Intake/Output from previous day: 09/17 0701 - 09/18 0700 In: 1159.8 [P.O.:200; I.V.:959.8] Out: 1860 [Urine:1035; Chest Tube:825]  Intake/Output this shift:    Current Meds: Scheduled Meds: . sodium chloride  10 mL/hr Intravenous Once  . acetaminophen  1,000 mg Oral 4 times per day   Or  . acetaminophen (TYLENOL) oral liquid 160 mg/5 mL  1,000 mg Per Tube 4 times per day  . amiodarone  200 mg Oral Q12H   Followed by  . [START ON 11/08/2014] amiodarone  200 mg Oral Daily  . bisacodyl  10 mg Oral Daily   Or  . bisacodyl  10 mg Rectal Daily  . Chlorhexidine Gluconate Cloth  6 each Topical Q0600  . docusate sodium  200 mg Oral Daily  . insulin aspart  0-24 Units Subcutaneous 6 times per day  . insulin detemir  15 Units Subcutaneous Daily  . metoprolol tartrate  12.5 mg Oral BID   Or  . metoprolol tartrate  12.5 mg Per Tube BID  . mupirocin ointment  1  application Nasal BID  . pantoprazole  40 mg Oral Daily  . pravastatin  80 mg Oral QHS  . sodium chloride  3 mL Intravenous Q12H   Continuous Infusions: . sodium chloride 20 mL/hr at 10/30/14 1600  . sodium chloride 250 mL (10/30/14 0505)  . sodium chloride 10 mL/hr at 11/01/14 0700  . amiodarone 30 mg/hr (11/01/14 0700)  . dexmedetomidine Stopped (10/30/14 0215)  . DOPamine 3 mcg/kg/min (11/01/14 0700)  . lactated ringers 20 mL/hr at 10/30/14 2000  . lactated ringers    . nitroGLYCERIN Stopped (10/30/14 1300)  . phenylephrine (NEO-SYNEPHRINE) Adult infusion Stopped (10/30/14 1600)   PRN Meds:.sodium chloride, metoprolol, midazolam, morphine injection, ondansetron (ZOFRAN) IV, oxyCODONE, sodium chloride, traMADol  General appearance: alert, cooperative, no distress and slowed mentation Neurologic: confused Heart: irregularly irregular rhythm Lungs: diminished breath sounds bibasilar Abdomen: soft, non-tender; bowel sounds normal; no masses,  no organomegaly Extremities: extremities normal, atraumatic, no cyanosis or edema and Homans sign is negative, no sign of DVT Wound: sternum stable  Lab Results: CBC: Recent Labs  10/31/14 0420 11/01/14 0345  WBC 11.3* 12.4*  HGB  8.9* 9.4*  HCT 25.9* 26.8*  PLT 102* 105*   BMET:  Recent Labs  10/31/14 0420 11/01/14 0345  NA 135 136  K 4.7 4.1  CL 103 103  CO2 25 24  GLUCOSE 195* 144*  BUN 24* 25*  CREATININE 1.53* 1.42*  CALCIUM 8.4* 8.8*    Chronic Kidney Disease   Stage I     GFR >90  Stage II    GFR 60-89  Stage IIIA GFR 45-59  Stage IIIB GFR 30-44  Stage IV   GFR 15-29  Stage V    GFR  <15  Lab Results  Component Value Date   CREATININE 1.42* 11/01/2014   Estimated Creatinine Clearance: 47.2 mL/min (by C-G formula based on Cr of 1.42).   PT/INR:  Recent Labs  10/29/14 2215  LABPROT 16.5*  INR 1.32   Radiology: No results found.   Assessment/Plan: S/P Procedure(s) (LRB): EXPLORATION POST OPERATIVE  OPEN HEART (N/A) Mobilize Diuresis Diabetes control Continue foley due to strict I&O, patient in ICU and urinary output monitoring Renal function stable 1.42, decrease gfr   Still afib, but with bleeding immediate post op will not start full anticoagulation at this point   Delight Ovens 11/01/2014 8:23 AM

## 2014-11-01 NOTE — Progress Notes (Signed)
Patient restless and combative prior to chest tube removal also patient attempting to get out of bed, per conversation with Dr. Tyrone Sage place bilateral soft wrist restraints. Will assess need for restraints throughout shift.  Corliss Skains RN

## 2014-11-01 NOTE — Progress Notes (Signed)
Patient ID: Frank Marks, male   DOB: 1927/01/15, 80 y.o.   MRN: [id number] EVENING ROUNDS NOTE :     [address]       [place] [address]             [phone]                 3 Days Post-Op Procedure(s) (LRB): EXPLORATION POST OPERATIVE OPEN HEART (N/A)  Total Length of Stay:  LOS: 3 days  BP 138/96 mmHg  Pulse 124  Temp(Src) 97.3 F (36.3 C) (Oral)  Resp 20  Ht  (1.854 m)  Wt 219 lb 12.8 oz (99.7 kg)  BMI 29.01 kg/m2  SpO2 97%  .Intake/Output      09/17 0701 - 09/18 0700 09/18 0701 - 09/19 0700   P.O. 200    I.V. (mL/kg) 959.8 (9.6) 320 (3.2)   NG/GT     IV Piggyback     Total Intake(mL/kg) 1159.8 (11.6) 320 (3.2)   Urine (mL/kg/hr) 1385 (0.6) 880 (0.8)   Chest Tube 915 (0.4)    Total Output 2300 880   Net -1140.2 -560          . sodium chloride 20 mL/hr at 10/30/14 1600  . sodium chloride 250 mL (10/30/14 0505)  . sodium chloride 10 mL/hr at 11/01/14 0700  . amiodarone 30 mg/hr (11/01/14 1549)  . dexmedetomidine Stopped (10/30/14 0215)  . DOPamine 3 mcg/kg/min (11/01/14 0700)  . lactated ringers 20 mL/hr at 10/30/14 2000  . lactated ringers    . nitroGLYCERIN Stopped (10/30/14 1300)  . phenylephrine (NEO-SYNEPHRINE) Adult infusion Stopped (10/30/14 1600)     Lab Results  Component Value Date   WBC 12.4* 11/01/2014   HGB 9.4* 11/01/2014   HCT 26.8* 11/01/2014   PLT 105* 11/01/2014   GLUCOSE 144* 11/01/2014   CHOL * 06/16/2007    248        ATP III CLASSIFICATION:  <200     mg/dL   Desirable  884-166  mg/dL   Borderline High  >=063    mg/dL   High   TRIG 016* 02/21/3233   HDL 30* 06/16/2007   LDLCALC * 06/16/2007    152        Total Cholesterol/HDL:CHD Risk Coronary Heart Disease Risk Table                     Men   Women  1/2 Average Risk   3.4   3.3   ALT 37 11/01/2014   AST 35 11/01/2014   NA 136 11/01/2014   K 4.1 11/01/2014   CL 103 11/01/2014   CREATININE 1.42* 11/01/2014   BUN 25* 11/01/2014   CO2 24  11/01/2014   TSH 1.840 11/01/2014   INR 1.32 10/29/2014   HGBA1C 6.5* 10/21/2014   Remains confused   Delight Ovens MD  Beeper [phone] Office [phone] 11/01/2014 5:47 PM

## 2014-11-02 ENCOUNTER — Inpatient Hospital Stay (HOSPITAL_COMMUNITY): Payer: Medicare Other

## 2014-11-02 LAB — TYPE AND SCREEN
ABO/RH(D): O POS
Antibody Screen: NEGATIVE
UNIT DIVISION: 0
UNIT DIVISION: 0
UNIT DIVISION: 0
UNIT DIVISION: 0
UNIT DIVISION: 0
Unit division: 0
Unit division: 0
Unit division: 0
Unit division: 0
Unit division: 0
Unit division: 0

## 2014-11-02 LAB — CBC
HCT: 26.9 % — ABNORMAL LOW (ref 39.0–52.0)
Hemoglobin: 9.2 g/dL — ABNORMAL LOW (ref 13.0–17.0)
MCH: 29.2 pg (ref 26.0–34.0)
MCHC: 34.2 g/dL (ref 30.0–36.0)
MCV: 85.4 fL (ref 78.0–100.0)
Platelets: 126 10*3/uL — ABNORMAL LOW (ref 150–400)
RBC: 3.15 MIL/uL — ABNORMAL LOW (ref 4.22–5.81)
RDW: 14.5 % (ref 11.5–15.5)
WBC: 11.1 10*3/uL — ABNORMAL HIGH (ref 4.0–10.5)

## 2014-11-02 LAB — BASIC METABOLIC PANEL
Anion gap: 12 (ref 5–15)
BUN: 30 mg/dL — ABNORMAL HIGH (ref 6–20)
CO2: 22 mmol/L (ref 22–32)
Calcium: 8.5 mg/dL — ABNORMAL LOW (ref 8.9–10.3)
Chloride: 98 mmol/L — ABNORMAL LOW (ref 101–111)
Creatinine, Ser: 1.49 mg/dL — ABNORMAL HIGH (ref 0.61–1.24)
GFR calc Af Amer: 48 mL/min — ABNORMAL LOW (ref >60)
GFR calc non Af Amer: 41 mL/min — ABNORMAL LOW (ref >60)
Glucose, Bld: 167 mg/dL — ABNORMAL HIGH (ref 65–99)
Potassium: 3.7 mmol/L (ref 3.5–5.1)
Sodium: 132 mmol/L — ABNORMAL LOW (ref 135–145)

## 2014-11-02 LAB — GLUCOSE, CAPILLARY
GLUCOSE-CAPILLARY: 160 mg/dL — AB (ref 65–99)
GLUCOSE-CAPILLARY: 97 mg/dL (ref 65–99)
GLUCOSE-CAPILLARY: 81 mg/dL (ref 65–99)
Glucose-Capillary: 116 mg/dL — ABNORMAL HIGH (ref 65–99)
Glucose-Capillary: 124 mg/dL — ABNORMAL HIGH (ref 65–99)
Glucose-Capillary: 97 mg/dL (ref 65–99)

## 2014-11-02 LAB — HEPARIN LEVEL (UNFRACTIONATED): HEPARIN UNFRACTIONATED: 0.15 [IU]/mL — AB (ref 0.30–0.70)

## 2014-11-02 MED ORDER — SODIUM CHLORIDE 0.9 % IJ SOLN
10.0000 mL | Freq: Two times a day (BID) | INTRAMUSCULAR | Status: DC
  Administered 2014-11-02 (×2): 10 mL

## 2014-11-02 MED ORDER — HEPARIN (PORCINE) IN NACL 100-0.45 UNIT/ML-% IJ SOLN
1300.0000 [IU]/h | INTRAMUSCULAR | Status: DC
  Administered 2014-11-02: 1000 [IU]/h via INTRAVENOUS
  Administered 2014-11-04: 1300 [IU]/h via INTRAVENOUS
  Filled 2014-11-02 (×4): qty 250

## 2014-11-02 MED ORDER — SODIUM CHLORIDE 0.9 % IJ SOLN: 10.0000 mL | INTRAMUSCULAR | Status: DC | PRN

## 2014-11-02 NOTE — Progress Notes (Signed)
ANTICOAGULATION CONSULT NOTE - Follow Up Consult  Pharmacy Consult for heparin  Indication: atrial fibrillation  No Known Allergies  Patient Measurements: Height:  (185.4 cm) Weight: 218 lb 0.6 oz (98.9 kg) IBW/kg (Calculated) : 79.9   Vital Signs: Temp: 98.1 F (36.7 C) (09/19 1940) Temp Source: Axillary (09/19 1940) BP: 97/51 mmHg (09/19 1900) Pulse Rate: 58 (09/19 1900)  Labs:  Recent Labs  10/31/14 0420 11/01/14 0345 11/02/14 0417 11/02/14 1830  HGB 8.9* 9.4* 9.2*  --   HCT 25.9* 26.8* 26.9*  --   PLT 102* 105* 126*  --   HEPARINUNFRC  --   --   --  0.15*  CREATININE 1.53* 1.42* 1.49*  --     Estimated Creatinine Clearance: 44.9 mL/min (by C-G formula based on Cr of 1.49).   Medications:  Scheduled:  . sodium chloride  10 mL/hr Intravenous Once  . acetaminophen  1,000 mg Oral 4 times per day   Or  . acetaminophen (TYLENOL) oral liquid 160 mg/5 mL  1,000 mg Per Tube 4 times per day  . bisacodyl  10 mg Oral Daily   Or  . bisacodyl  10 mg Rectal Daily  . Chlorhexidine Gluconate Cloth  6 each Topical Q0600  . docusate sodium  200 mg Oral Daily  . insulin aspart  0-24 Units Subcutaneous 6 times per day  . insulin detemir  15 Units Subcutaneous Daily  . metoprolol tartrate  12.5 mg Oral BID   Or  . metoprolol tartrate  12.5 mg Per Tube BID  . mupirocin ointment  1 application Nasal BID  . pantoprazole  40 mg Oral Daily  . pravastatin  80 mg Oral QHS  . sodium chloride  10-40 mL Intracatheter Q12H  . sodium chloride  3 mL Intravenous Q12H   Infusions:  . sodium chloride 20 mL/hr at 10/30/14 1600  . sodium chloride 250 mL (10/30/14 0505)  . sodium chloride 50 mL (11/02/14 1115)  . dexmedetomidine 0.1 mcg/kg/hr (11/02/14 1102)  . heparin 1,000 Units/hr (11/02/14 1102)  . lactated ringers 20 mL/hr at 10/30/14 2000  . lactated ringers      Assessment: 81 y.o. male s/p CABG 9/15, h/o Afib on pradaxa PTA and now on heparin (initial heparin level is  0.15). Plans noted for transition to coumadin.   Goal of Therapy:  Heparin level 0.3-0.7 units/ml Monitor platelets by anticoagulation protocol: Yes   Plan:   -Increase heparin to 1300 units/hr -Heparin level in 8 hrs with CBC daily  Harland German, Pharm D 11/02/2014 7:49 PM

## 2014-11-02 NOTE — Care Management Important Message (Signed)
Important Message  Patient Details  Name: Frank Marks MRN: [id number] Date of Birth: September 11, 1926   Medicare Important Message Given:  Yes-second notification given    Magdalene River, RN 11/02/2014, 2:26 PM

## 2014-11-02 NOTE — Progress Notes (Signed)
ANTICOAGULATION CONSULT NOTE - Initial Consult  Pharmacy Consult for Heparin Indication: atrial fibrillation  No Known Allergies  Patient Measurements: Height:  (185.4 cm) Weight: 218 lb 0.6 oz (98.9 kg) IBW/kg (Calculated) : 79.9 Heparin Dosing Weight:  95 kg  Vital Signs: Temp: 97.9 F (36.6 C) (09/19 0426) Temp Source: Axillary (09/19 0426) BP: 132/86 mmHg (09/19 0700) Pulse Rate: 92 (09/19 0700)  Labs:  Recent Labs  10/31/14 0420 11/01/14 0345 11/02/14 0417  HGB 8.9* 9.4* 9.2*  HCT 25.9* 26.8* 26.9*  PLT 102* 105* 126*  CREATININE 1.53* 1.42* 1.49*    Estimated Creatinine Clearance: 44.9 mL/min (by C-G formula based on Cr of 1.49).   Medical History: Past Medical History  Diagnosis Date  . Hypertension   . Hypercholesterolemia   . Chronic back pain   . Hx of cardiovascular stress test     Lex MV 3/14:  Not gated, apical thinning, no ischemia.   Marland Kitchen Hx of echocardiogram     Echo 04/24/12: mild LVH, EF 50-55%, mild AS (mean 9), MAC, mild MR, mod-severe BAE, mod TR, PASP 39  . Acute on chronic diastolic CHF (congestive heart failure), NYHA class 3 07/29/2014  . TIA (transient ischemic attack) 06/2007    "had 2-3 little ones around that time"  . Carotid artery stenosis   . Chronic atrial fibrillation   . Type II diabetes mellitus   . Osteoarthritis   . Arthritis     "back" (07/29/2014)  . Chronic lower back pain   . Exertional shortness of breath     chronic/notes 07/29/2014  . Pneumonia ~ 1951; 02/2014  . Dysrhythmia     afib  . Heart murmur   . Stroke     ministroke- 2xs  . Anxiety     short periods of nervousness, reported since need for heart care, he feels anxious on & off.    Marland Kitchen COPD (chronic obstructive pulmonary disease)     Medications:  Scheduled:  . sodium chloride  10 mL/hr Intravenous Once  . acetaminophen  1,000 mg Oral 4 times per day   Or  . acetaminophen (TYLENOL) oral liquid 160 mg/5 mL  1,000 mg Per Tube 4 times per day  .  bisacodyl  10 mg Oral Daily   Or  . bisacodyl  10 mg Rectal Daily  . Chlorhexidine Gluconate Cloth  6 each Topical Q0600  . docusate sodium  200 mg Oral Daily  . insulin aspart  0-24 Units Subcutaneous 6 times per day  . insulin detemir  15 Units Subcutaneous Daily  . metoprolol tartrate  12.5 mg Oral BID   Or  . metoprolol tartrate  12.5 mg Per Tube BID  . mupirocin ointment  1 application Nasal BID  . pantoprazole  40 mg Oral Daily  . pravastatin  80 mg Oral QHS  . sodium chloride  3 mL Intravenous Q12H    Assessment: 81 y.o. male s/p CABG 9/15, h/o Afib Pradaxa on hold, for heparin  Goal of Therapy:  Heparin level 0.3-0.7 units/ml Monitor platelets by anticoagulation protocol: Yes   Plan:  Start heparin 1000 units/hr Check heparin level in 8 hours.   Abbott, Gary Fleet 11/02/2014,7:50 AM

## 2014-11-02 NOTE — Evaluation (Addendum)
Physical Therapy Evaluation Patient Details Name: Frank Marks MRN: [id number] DOB: Nov 11, 1926 Today's Date: 11/02/2014   History of Present Illness  81 year old gentleman with a history of hypertension, hypercholesterolemia, acute on chronic diastolic heart failure, chronic atrial fibrillation, and chronic diastolic heart failure with progressive shortness of breath, exertional fatigue, and substernal chest discomfort. Admitted for CABGx 4 and AVR performed 9/15 with return to OR 9/16 for exploration secondary to mediastinal bleeding. Post op course complicated by AMS  Clinical Impression  Pt pleasant but confused regarding orientation and situation. Able to follow commands and assist with mobility OOB. Did not attempt gait at this time secondary to safety. Pt with decreased cognition, balance and activity tolerance who will benefit from acute therapy to maximize mobility, function, gait and activity tolerance to decrease burden of care and return pt to PLOF.     Follow Up Recommendations SNF;Supervision/Assistance - 24 hour    Equipment Recommendations  Other (comment) (TBD with pt progression)    Recommendations for Other Services       Precautions / Restrictions Precautions Precautions: Sternal;Fall Precaution Comments: chest tube      Mobility  Bed Mobility Overal bed mobility: Needs Assistance Bed Mobility: Rolling;Sidelying to Sit Rolling: Min assist Sidelying to sit: Mod assist       General bed mobility comments: cues for sequence with assist to maintain sternal precautions and not utilizing upper body. Assist to bring legs off of bed and fully elevate trunk  Transfers Overall transfer level: Needs assistance   Transfers: Sit to/from Stand Sit to Stand: Min assist;From elevated surface         General transfer comment: bil HHA to control pt sternal precautions he was able to stand from bed and pivot to chair holding onto therapist with cues to sequence and  direct  Ambulation/Gait                Stairs            Wheelchair Mobility    Modified Rankin (Stroke Patients Only)       Balance Overall balance assessment: Needs assistance   Sitting balance-Leahy Scale: Fair Sitting balance - Comments: initial min assist for balance with progression to supervision after 1 min EOB     Standing balance-Leahy Scale: Poor                               Pertinent Vitals/Pain Pain Assessment: No/denies pain  HR 72-86 with activity BP supine 126/76, sitting 109/74 sats 100% on RA    Home Living Family/patient expects to be discharged to:: Private residence Living Arrangements: Alone   Type of Home: House Home Access: Stairs to enter     Home Layout: One level Home Equipment: Environmental consultant - 2 wheels;Cane - single point;Shower seat      Prior Function Level of Independence: Independent with assistive device(s)         Comments: Pt unable to provide PLOF and taken from last admission. Friend present during session who confirmed pt lived alone, walked with a cane and was SOB after 30'. Has a girlfriend and 2 sons.      Hand Dominance        Extremity/Trunk Assessment   Upper Extremity Assessment: Overall WFL for tasks assessed           Lower Extremity Assessment: Overall WFL for tasks assessed      Cervical / Trunk Assessment:  Kyphotic  Communication   Communication: HOH  Cognition Arousal/Alertness: Awake/alert Behavior During Therapy: Flat affect Overall Cognitive Status: Impaired/Different from baseline Area of Impairment: Orientation;Memory;Following commands;Safety/judgement;Problem solving Orientation Level: Disoriented to;Time;Situation   Memory: Decreased short-term memory Following Commands: Follows one step commands with increased time Safety/Judgement: Decreased awareness of safety   Problem Solving: Slow processing;Requires verbal cues;Requires tactile cues General Comments: Pt  following tactile commands consistently and verbal commands grossly 80% of the time. Stating year is 29. Pt HOH with hearing aid    General Comments      Exercises General Exercises - Lower Extremity Long Arc Quad: AROM;Seated;Both;10 reps Hip Flexion/Marching: AROM;Seated;Both;10 reps      Assessment/Plan    PT Assessment Patient needs continued PT services  PT Diagnosis Difficulty walking;Altered mental status   PT Problem List Decreased strength;Decreased activity tolerance;Decreased balance;Decreased mobility;Decreased safety awareness;Decreased knowledge of use of DME  PT Treatment Interventions Gait training;DME instruction;Functional mobility training;Therapeutic activities;Therapeutic exercise;Patient/family education   PT Goals (Current goals can be found in the Care Plan section) Acute Rehab PT Goals Patient Stated Goal: go hunting PT Goal Formulation: With patient Time For Goal Achievement: 11/16/14 Potential to Achieve Goals: Good    Frequency Min 3X/week   Barriers to discharge Decreased caregiver support no family present to confirm level of assist at D/C    Co-evaluation               End of Session Equipment Utilized During Treatment: Gait belt Activity Tolerance: Patient tolerated treatment well Patient left: in chair;with call bell/phone within reach;with chair alarm set;with nursing/sitter in room Nurse Communication: Mobility status;Precautions         Time: 1610-9604 PT Time Calculation (min) (ACUTE ONLY): 26 min   Charges:   PT Evaluation $Initial PT Evaluation Tier I: 1 Procedure PT Treatments $Therapeutic Activity: 8-22 mins   PT G CodesDelorse Lek 11/02/2014, 2:24 PM Delaney Meigs, PT [phone]

## 2014-11-02 NOTE — Progress Notes (Signed)
Peripherally Inserted Central Catheter/Midline Placement  The IV Nurse has discussed with the patient and/or persons authorized to consent for the patient, the purpose of this procedure and the potential benefits and risks involved with this procedure.  The benefits include less needle sticks, lab draws from the catheter and patient may be discharged home with the catheter.  Risks include, but not limited to, infection, bleeding, blood clot (thrombus formation), and puncture of an artery; nerve damage and irregular heat beat.  Alternatives to this procedure were also discussed.  PICC/Midline Placement Documentation  PICC / Midline Double Lumen 11/02/14 PICC Right Basilic 45 cm 0 cm (Active)  Exposed Catheter (cm) 0 cm 11/02/2014  9:08 AM  Dressing Change Due 11/09/14 11/02/2014  9:08 AM       Ruvolis, Anderson Malta 11/02/2014, 9:09 AM

## 2014-11-02 NOTE — Progress Notes (Addendum)
Pt still extremely agitated and combative despite  Haldol x2. Dr. Tyrone Sage paged for new orders and to update on pt status; HR tachy into 140-150s, RR 25-30. Difficult to get a BP via cuff because of pt arm movement and stiffening.  Extremely restless and agitated despite best effort by RN and other unit staff to calm pt. Pt pulling vigorously on bilateral wrist restraints. Addiment about getting out of bed to go home.   Orders received to try  Morphine Q1h.    0451 new orders received to re-start Precedex gtt.

## 2014-11-02 NOTE — Progress Notes (Signed)
TCTS BRIEF SICU PROGRESS NOTE  4 Days Post-Op  S/P Procedure(s) (LRB): EXPLORATION POST OPERATIVE OPEN HEART (N/A)   Stable day Afib w/ controlled rate O2 sats 99-100% on RA UOP adequate  Plan: Continue current plan  Purcell Nails 11/02/2014 6:09 PM

## 2014-11-02 NOTE — Progress Notes (Signed)
4 Days Post-Op Procedure(s) (LRB): EXPLORATION POST OPERATIVE OPEN HEART (N/A) Subjective:  He has remained confused and agitated all weekend. Haldol did not help and he was started on Precedex last night which has kept him more calm.  Went back into atrial fib this weekend.  Objective: Vital signs in last 24 hours: Temp:  [97.3 F (36.3 C)-98.1 F (36.7 C)] 97.9 F (36.6 C) (09/19 0426) Pulse Rate:  [40-140] 92 (09/19 0700) Cardiac Rhythm:  [-] Atrial fibrillation (09/19 0600) Resp:  [5-40] 14 (09/19 0700) BP: (100-177)/(61-129) 132/86 mmHg (09/19 0700) SpO2:  [85 %-100 %] 98 % (09/19 0700) Weight:  [98.9 kg (218 lb 0.6 oz)] 98.9 kg (218 lb 0.6 oz) (09/19 0416)  Hemodynamic parameters for last 24 hours:    Intake/Output from previous day: 09/18 0701 - 09/19 0700 In: 773.9 [I.V.:773.9] Out: 1668 [Urine:1550; Chest Tube:118] Intake/Output this shift:    General appearance: confused but cooperative Neurologic: non-focal Heart: irregularly irregular rhythm Lungs: clear to auscultation bilaterally Abdomen: soft, non-tender; bowel sounds normal; no masses,  no organomegaly Extremities: edema mild Wound: incisions ok  Lab Results:  Recent Labs  11/01/14 0345 11/02/14 0417  WBC 12.4* 11.1*  HGB 9.4* 9.2*  HCT 26.8* 26.9*  PLT 105* 126*   BMET:  Recent Labs  11/01/14 0345 11/02/14 0417  NA 136 132*  K 4.1 3.7  CL 103 98*  CO2 24 22  GLUCOSE 144* 167*  BUN 25* 30*  CREATININE 1.42* 1.49*  CALCIUM 8.8* 8.5*    PT/INR: No results for input(s): LABPROT, INR in the last 72 hours. ABG    Component Value Date/Time   PHART 7.427 10/30/2014 1131   HCO3 23.1 10/30/2014 1131   TCO2 20 10/30/2014 1626   ACIDBASEDEF 1.0 10/30/2014 1131   O2SAT 97.0 10/30/2014 1131   CBG (last 3)   Recent Labs  11/01/14 1527 11/01/14 1943 11/02/14 0012  GLUCAP 128* 132* 124*   CXR:  CLINICAL DATA: CABG.  EXAM: PORTABLE CHEST - 1 VIEW  FINDINGS: Interim removal  of mediastinal drainage catheter. Right IJ line and left chest tube in stable position. Prior CABG and cardiac valve replacement. Cardiomegaly with persistent bilateral pulmonary interstitial prominence and small pleural effusions consistent with persistent congestive heart failure. No pneumothorax.  IMPRESSION: 1. Interim removal of mediastinal drainage catheter. Right IJ line and left chest tube in stable position. No pneumothorax. 2. Prior CABG and cardiac valve replacement. Persistent cardiomegaly and diffuse bilateral pulmonary interstitial prominence consistent congestive heart failure. Small bilateral pleural effusions .   Electronically Signed  By: Maisie Fus Register  On: 11/02/2014 07:40  Assessment/Plan:  S/P CABG and AVR with tissue valve S/P exploration for postop bleeding  He has been hemodynamically stable.  Chronic atrial fibrillation: he was in sinus postop but has returned to a-fib. Rate 110. He was started on amio over the weekend but with chronic a-fib this is not needed. Will stop it and work on rate control. Wean off dopamine which may help with rate control. He was on Cardizem and Lopressor preop.  He now has a prosthetic valve with a-fib so he will need coumadin. Will start heparin with no bolus per pharmacy and transition to coumadin when taking po.  Delirium: probably post-surgical due to drugs, sleep deprivation. Will continue low dose Precedex for now.   DM: glucose under control.   DC chest tube and sleeve  He will need PICC line  Physical therapy.   LOS: 4 days    Frank Marks  Bartle 11/02/2014

## 2014-11-03 LAB — BASIC METABOLIC PANEL
Anion gap: 9 (ref 5–15)
BUN: 38 mg/dL — ABNORMAL HIGH (ref 6–20)
CO2: 23 mmol/L (ref 22–32)
Calcium: 8.2 mg/dL — ABNORMAL LOW (ref 8.9–10.3)
Chloride: 102 mmol/L (ref 101–111)
Creatinine, Ser: 1.56 mg/dL — ABNORMAL HIGH (ref 0.61–1.24)
GFR calc Af Amer: 45 mL/min — ABNORMAL LOW (ref >60)
GFR calc non Af Amer: 39 mL/min — ABNORMAL LOW (ref >60)
Glucose, Bld: 89 mg/dL (ref 65–99)
Potassium: 3.3 mmol/L — ABNORMAL LOW (ref 3.5–5.1)
Sodium: 134 mmol/L — ABNORMAL LOW (ref 135–145)

## 2014-11-03 LAB — GLUCOSE, CAPILLARY
GLUCOSE-CAPILLARY: 102 mg/dL — AB (ref 65–99)
GLUCOSE-CAPILLARY: 105 mg/dL — AB (ref 65–99)
GLUCOSE-CAPILLARY: 85 mg/dL (ref 65–99)
GLUCOSE-CAPILLARY: 88 mg/dL (ref 65–99)
Glucose-Capillary: 173 mg/dL — ABNORMAL HIGH (ref 65–99)
Glucose-Capillary: 155 mg/dL — ABNORMAL HIGH (ref 65–99)
Glucose-Capillary: 81 mg/dL (ref 65–99)

## 2014-11-03 LAB — HEPARIN LEVEL (UNFRACTIONATED)
HEPARIN UNFRACTIONATED: 0.41 [IU]/mL (ref 0.30–0.70)
HEPARIN UNFRACTIONATED: 0.5 [IU]/mL (ref 0.30–0.70)

## 2014-11-03 LAB — CBC
HEMATOCRIT: 26 % — AB (ref 39.0–52.0)
HEMOGLOBIN: 8.9 g/dL — AB (ref 13.0–17.0)
MCH: 29.5 pg (ref 26.0–34.0)
MCHC: 34.2 g/dL (ref 30.0–36.0)
MCV: 86.1 fL (ref 78.0–100.0)
Platelets: 108 10*3/uL — ABNORMAL LOW (ref 150–400)
RBC: 3.02 MIL/uL — AB (ref 4.22–5.81)
RDW: 14.3 % (ref 11.5–15.5)
WBC: 8.2 10*3/uL (ref 4.0–10.5)

## 2014-11-03 MED ORDER — WARFARIN SODIUM 5 MG PO TABS
5.0000 mg | ORAL_TABLET | Freq: Once | ORAL | Status: AC
  Administered 2014-11-03: 5 mg via ORAL
  Filled 2014-11-03: qty 1

## 2014-11-03 MED ORDER — WARFARIN - PHYSICIAN DOSING INPATIENT: Freq: Every day | Status: DC

## 2014-11-03 MED ORDER — POTASSIUM CHLORIDE 10 MEQ/50ML IV SOLN
10.0000 meq | INTRAVENOUS | Status: AC
  Administered 2014-11-03 (×3): 10 meq via INTRAVENOUS
  Filled 2014-11-03: qty 50

## 2014-11-03 MED ORDER — GERHARDT'S BUTT CREAM
TOPICAL_CREAM | CUTANEOUS | Status: DC | PRN
  Administered 2014-11-03 (×2): 1 via TOPICAL
  Filled 2014-11-03: qty 1

## 2014-11-03 NOTE — Progress Notes (Signed)
CT surgery p.m. Rounds  Patient examined and record reviewed.Hemodynamics stable,labs satisfactory.Patient had stable day.Continue current care. Peter Van Trigt III 11/03/2014   

## 2014-11-03 NOTE — Progress Notes (Addendum)
ANTICOAGULATION CONSULT NOTE - Follow Up Consult  Pharmacy Consult for heparin  Indication: atrial fibrillation  No Known Allergies  Patient Measurements: Height:  (185.4 cm) Weight: 221 lb 1.9 oz (100.3 kg) IBW/kg (Calculated) : 79.9   Vital Signs: Temp: 97.1 F (36.2 C) (09/20 0757) Temp Source: Oral (09/20 0757) BP: 152/77 mmHg (09/20 1000) Pulse Rate: 76 (09/20 1000)  Labs:  Recent Labs  11/01/14 0345 11/02/14 0417 11/02/14 1830 11/03/14 0354  HGB 9.4* 9.2*  --  8.9*  HCT 26.8* 26.9*  --  26.0*  PLT 105* 126*  --  108*  HEPARINUNFRC  --   --  0.15* 0.41  CREATININE 1.42* 1.49*  --  1.56*    Estimated Creatinine Clearance: 43.1 mL/min (by C-G formula based on Cr of 1.56).   Medications:  Scheduled:  . sodium chloride  10 mL/hr Intravenous Once  . acetaminophen  1,000 mg Oral 4 times per day   Or  . acetaminophen (TYLENOL) oral liquid 160 mg/5 mL  1,000 mg Per Tube 4 times per day  . bisacodyl  10 mg Oral Daily   Or  . bisacodyl  10 mg Rectal Daily  . docusate sodium  200 mg Oral Daily  . insulin aspart  0-24 Units Subcutaneous 6 times per day  . insulin detemir  15 Units Subcutaneous Daily  . metoprolol tartrate  12.5 mg Oral BID   Or  . metoprolol tartrate  12.5 mg Per Tube BID  . pantoprazole  40 mg Oral Daily  . pravastatin  80 mg Oral QHS  . sodium chloride  10-40 mL Intracatheter Q12H  . sodium chloride  3 mL Intravenous Q12H   Infusions:  . sodium chloride 20 mL/hr at 10/30/14 1600  . sodium chloride 250 mL (10/30/14 0505)  . sodium chloride 50 mL/hr at 11/03/14 1000  . dexmedetomidine Stopped (11/03/14 0800)  . heparin 1,300 Units/hr (11/03/14 1000)  . lactated ringers 20 mL/hr at 10/30/14 2000  . lactated ringers      Assessment: 81 y.o. male s/p CABG 9/15, h/o Afib on pradaxa PTA and now on heparin infusion at 1300 units. HL this AM was therapeutic. Plans noted for transition to coumadin once patient is tolerating PO's. Hgb has  trended down to 8.9 today. Plt remain low at 108K.   Goal of Therapy:  Heparin level 0.3-0.7 units/ml Monitor platelets by anticoagulation protocol: Yes   Plan:   -Continue heparin infusion at 1300 units/hr -F/u confirmatory HL  -Monitor daily heparin level, CBC and s/s of bleeding  -F/u resuming Coumadin when able to tolerate PO    Vinnie Level, PharmD., BCPS Clinical Pharmacist Pager [phone]     Addendum: Confirmatory heparin level remains therapeutic. RN reports no s/s of bleeding. Continue IV heparin at 1300 units/hr and monitor daily HL  Vinnie Level, PharmD., BCPS Clinical Pharmacist Pager [phone]

## 2014-11-03 NOTE — Progress Notes (Addendum)
TCTS DAILY ICU PROGRESS NOTE                   [address]            [place] [address]          [phone]   5 Days Post-Op Procedure(s) (LRB): EXPLORATION POST OPERATIVE OPEN HEART (N/A)  Total Length of Stay:  LOS: 5 days   Subjective: Pleasantly confused overnight, but no more agitation. This am, he is oriented to person and place, "they tell me I had heart surgery." No complaints.   Objective: Vital signs in last 24 hours: Temp:  [96.3 F (35.7 C)-98.1 F (36.7 C)] 97.1 F (36.2 C) (09/20 0757) Pulse Rate:  [31-108] 108 (09/20 0700) Cardiac Rhythm:  [-] Atrial fibrillation (09/19 2000) Resp:  [12-30] 24 (09/20 0700) BP: (77-142)/(40-84) 140/84 mmHg (09/20 0700) SpO2:  [90 %-100 %] 95 % (09/20 0700) Weight:  [221 lb 1.9 oz (100.3 kg)] 221 lb 1.9 oz (100.3 kg) (09/20 0500)  Filed Weights   11/01/14 0500 11/02/14 0416 11/03/14 0500  Weight: 219 lb 12.8 oz (99.7 kg) 218 lb 0.6 oz (98.9 kg) 221 lb 1.9 oz (100.3 kg)  BASELINE WEIGHT: 94 kg  Weight change: 3 lb 1.4 oz (1.4 kg)   Hemodynamic parameters for last 24 hours:    Intake/Output from previous day: 09/19 0701 - 09/20 0700 In: 1430.1 [P.O.:360; I.V.:1070.1] Out: 1495 [Urine:1295; Chest Tube:200]  CBGs 97-116-89-88      Current Meds: Scheduled Meds: . sodium chloride  10 mL/hr Intravenous Once  . acetaminophen  1,000 mg Oral 4 times per day   Or  . acetaminophen (TYLENOL) oral liquid 160 mg/5 mL  1,000 mg Per Tube 4 times per day  . bisacodyl  10 mg Oral Daily   Or  . bisacodyl  10 mg Rectal Daily  . docusate sodium  200 mg Oral Daily  . insulin aspart  0-24 Units Subcutaneous 6 times per day  . insulin detemir  15 Units Subcutaneous Daily  . metoprolol tartrate  12.5 mg Oral BID   Or  . metoprolol tartrate  12.5 mg Per Tube BID  . mupirocin ointment  1 application Nasal BID  . pantoprazole  40 mg Oral Daily  . potassium chloride  10 mEq Intravenous Q1 Hr x 3  . pravastatin  80  mg Oral QHS  . sodium chloride  10-40 mL Intracatheter Q12H  . sodium chloride  3 mL Intravenous Q12H   Continuous Infusions: . sodium chloride 20 mL/hr at 10/30/14 1600  . sodium chloride 250 mL (10/30/14 0505)  . sodium chloride 50 mL (11/02/14 1115)  . dexmedetomidine 0.1 mcg/kg/hr (11/02/14 1102)  . heparin 1,300 Units/hr (11/02/14 1953)  . lactated ringers 20 mL/hr at 10/30/14 2000  . lactated ringers     PRN Meds:.sodium chloride, metoprolol, ondansetron (ZOFRAN) IV, sodium chloride, sodium chloride   Physical Exam: General appearance: alert, cooperative and no distress Heart: irregularly irregular rhythm Lungs: clear to auscultation bilaterally Extremities: Mild LE edema Wound: Clean and dry    Lab Results: CBC: Recent Labs  11/02/14 0417 11/03/14 0354  WBC 11.1* 8.2  HGB 9.2* 8.9*  HCT 26.9* 26.0*  PLT 126* 108*   BMET:  Recent Labs  11/02/14 0417 11/03/14 0354  NA 132* 134*  K 3.7 3.3*  CL 98* 102  CO2 22 23  GLUCOSE 167* 89  BUN 30* 38*  CREATININE 1.49* 1.56*  CALCIUM 8.5* 8.2*  PT/INR: No results for input(s): LABPROT, INR in the last 72 hours. Radiology: Dg Chest Port 1 View  11/02/2014   CLINICAL DATA:  PICC line placement  EXAM: PORTABLE CHEST - 1 VIEW  COMPARISON:  11/02/2014  FINDINGS: Right PICC line has been placed with the tip in the SVC. Left basilar chest tube in place. No pneumothorax. Prior median sternotomy and valve replacement. There is cardiomegaly with vascular congestion. Bibasilar opacities compatible with atelectasis. Small bilateral effusions suspected. Mild interstitial prominence could reflect interstitial edema.  IMPRESSION: Right PICC line tip in the SVC.  Otherwise no significant change.   Electronically Signed   By: Charlett Nose M.D.   On: 11/02/2014 09:32     Assessment/Plan: S/P Procedure(s) (LRB): EXPLORATION POST OPERATIVE OPEN HEART (N/A)  CV- Chronic AF, rates controlled. Continue Lopressor, may need to increase  to home dose (25 mg bid) as his BPs are trending up. Coumadin/Heparin loading.  Postop delirium- more alert, calmer and oriented this am. Wean off Precedex.  Renal- BUN/Cr up a little further today. Appears edematous and weight up around 6 kg.  Likely needs diuresis.  Hypokalemia- replace K+.  Expected postop blood loss anemia- H/h generally stable.  PT/reconditioning.  DM- CBGs stable. Continue Levemir, resume po meds on eating better.  COLLINS,GINA H 11/03/2014 8:09 AM   Chart reviewed, patient examined, agree with above. He is still confused but pleasant. Eating, bowels working, OOB to chair. Therapeutic on heparin drip for A-fib and tissue valve. Start coumadin today. Physical therapy. Will need SNF at discharge

## 2014-11-03 NOTE — Clinical Social Work Note (Signed)
CSW received referral for patient to go to SNF.  CSW to meet with patient tomorrow to discuss and explain SNF placement procedure.  Formal assessment to follow.  Frank Marks. Anterhaus, MSW, Theres[facility] Majors [phone] 11/03/2014 5:20 PM

## 2014-11-04 LAB — GLUCOSE, CAPILLARY
GLUCOSE-CAPILLARY: 73 mg/dL (ref 65–99)
GLUCOSE-CAPILLARY: 147 mg/dL — AB (ref 65–99)
GLUCOSE-CAPILLARY: 141 mg/dL — AB (ref 65–99)
Glucose-Capillary: 126 mg/dL — ABNORMAL HIGH (ref 65–99)
Glucose-Capillary: 126 mg/dL — ABNORMAL HIGH (ref 65–99)
Glucose-Capillary: 177 mg/dL — ABNORMAL HIGH (ref 65–99)

## 2014-11-04 LAB — CBC
HEMATOCRIT: 26.1 % — AB (ref 39.0–52.0)
HEMOGLOBIN: 8.8 g/dL — AB (ref 13.0–17.0)
MCH: 28.9 pg (ref 26.0–34.0)
MCHC: 33.7 g/dL (ref 30.0–36.0)
MCV: 85.9 fL (ref 78.0–100.0)
Platelets: 150 10*3/uL (ref 150–400)
RBC: 3.04 MIL/uL — AB (ref 4.22–5.81)
RDW: 14.5 % (ref 11.5–15.5)
WBC: 9 10*3/uL (ref 4.0–10.5)

## 2014-11-04 LAB — BASIC METABOLIC PANEL
Anion gap: 7 (ref 5–15)
BUN: 31 mg/dL — ABNORMAL HIGH (ref 6–20)
CHLORIDE: 105 mmol/L (ref 101–111)
CO2: 24 mmol/L (ref 22–32)
Calcium: 8.3 mg/dL — ABNORMAL LOW (ref 8.9–10.3)
Creatinine, Ser: 1.27 mg/dL — ABNORMAL HIGH (ref 0.61–1.24)
GFR calc Af Amer: 58 mL/min — ABNORMAL LOW (ref >60)
GFR calc non Af Amer: 50 mL/min — ABNORMAL LOW (ref >60)
GLUCOSE: 158 mg/dL — AB (ref 65–99)
POTASSIUM: 3.5 mmol/L (ref 3.5–5.1)
Sodium: 136 mmol/L (ref 135–145)

## 2014-11-04 LAB — HEPARIN LEVEL (UNFRACTIONATED): HEPARIN UNFRACTIONATED: 0.59 [IU]/mL (ref 0.30–0.70)

## 2014-11-04 LAB — PROTIME-INR
INR: 1.27 (ref 0.00–1.49)
PROTHROMBIN TIME: 16 s — AB (ref 11.6–15.2)

## 2014-11-04 MED ORDER — MAGNESIUM HYDROXIDE 400 MG/5ML PO SUSP: 30.0000 mL | Freq: Every day | ORAL | Status: DC | PRN

## 2014-11-04 MED ORDER — DOCUSATE SODIUM 100 MG PO CAPS
200.0000 mg | ORAL_CAPSULE | Freq: Every day | ORAL | Status: DC
  Administered 2014-11-05 – 2014-11-10 (×4): 200 mg via ORAL
  Filled 2014-11-04 (×4): qty 2

## 2014-11-04 MED ORDER — SODIUM CHLORIDE 0.9 % IJ SOLN
3.0000 mL | Freq: Two times a day (BID) | INTRAMUSCULAR | Status: DC
  Administered 2014-11-04 (×2): 3 mL via INTRAVENOUS
  Administered 2014-11-06: 10 mL via INTRAVENOUS
  Administered 2014-11-06 – 2014-11-09 (×3): 3 mL via INTRAVENOUS

## 2014-11-04 MED ORDER — ACETAMINOPHEN 325 MG PO TABS
650.0000 mg | ORAL_TABLET | Freq: Four times a day (QID) | ORAL | Status: DC | PRN
  Administered 2014-11-04: 650 mg via ORAL
  Filled 2014-11-04: qty 2

## 2014-11-04 MED ORDER — SODIUM CHLORIDE 0.9 % IJ SOLN: 3.0000 mL | INTRAMUSCULAR | Status: DC | PRN

## 2014-11-04 MED ORDER — BISACODYL 10 MG RE SUPP: 10.0000 mg | Freq: Every day | RECTAL | Status: DC | PRN

## 2014-11-04 MED ORDER — HEPARIN (PORCINE) IN NACL 100-0.45 UNIT/ML-% IJ SOLN
1650.0000 [IU]/h | INTRAMUSCULAR | Status: DC
  Administered 2014-11-04 – 2014-11-05 (×2): 1300 [IU]/h via INTRAVENOUS
  Administered 2014-11-06 – 2014-11-07 (×2): 1500 [IU]/h via INTRAVENOUS
  Filled 2014-11-04 (×4): qty 250

## 2014-11-04 MED ORDER — INFLUENZA VAC SPLIT QUAD 0.5 ML IM SUSY
0.5000 mL | PREFILLED_SYRINGE | INTRAMUSCULAR | Status: AC | PRN
  Administered 2014-11-10: 0.5 mL via INTRAMUSCULAR
  Filled 2014-11-04: qty 0.5

## 2014-11-04 MED ORDER — SODIUM CHLORIDE 0.9 % IV SOLN: 250.0000 mL | INTRAVENOUS | Status: DC | PRN

## 2014-11-04 MED ORDER — MOVING RIGHT ALONG BOOK
Freq: Once | Status: AC
  Administered 2014-11-04: 1
  Filled 2014-11-04: qty 1

## 2014-11-04 MED ORDER — WARFARIN SODIUM 5 MG PO TABS
5.0000 mg | ORAL_TABLET | Freq: Once | ORAL | Status: AC
  Administered 2014-11-04: 5 mg via ORAL
  Filled 2014-11-04: qty 1

## 2014-11-04 MED ORDER — POTASSIUM CHLORIDE 10 MEQ/50ML IV SOLN
10.0000 meq | INTRAVENOUS | Status: DC
  Administered 2014-11-04: 10 meq via INTRAVENOUS
  Filled 2014-11-04 (×3): qty 50

## 2014-11-04 MED ORDER — BISACODYL 5 MG PO TBEC: 10.0000 mg | DELAYED_RELEASE_TABLET | Freq: Every day | ORAL | Status: DC | PRN

## 2014-11-04 MED ORDER — WARFARIN - PHYSICIAN DOSING INPATIENT
Freq: Every day | Status: DC
  Administered 2014-11-04: 1
  Administered 2014-11-05: 18:00:00

## 2014-11-04 NOTE — Progress Notes (Signed)
Physical Therapy Treatment Patient Details Name: Frank Marks MRN: [id number] DOB: [birth year] Today's Date: 11/04/2014    History of Present Illness 81 year old gentleman with a history of hypertension, hypercholesterolemia, acute on chronic diastolic heart failure, chronic atrial fibrillation, and chronic diastolic heart failure with progressive shortness of breath, exertional fatigue, and substernal chest discomfort. Admitted for CABGx 4 and AVR performed 9/15 with return to OR 9/16 for exploration secondary to mediastinal bleeding. Post op course complicated by AMS    PT Comments    Patient required max encouragement to ambulate stating "i'm too weak and need to get back to bed" (RN had just assisted OOB). Once walking did well considering limited activity up until now. Difficult to determine how much confusion is present as he is very HOH.   Follow Up Recommendations  SNF;Supervision/Assistance - 24 hour     Equipment Recommendations  Other (comment) (TBD)    Recommendations for Other Services       Precautions / Restrictions Precautions Precautions: Sternal;Fall Precaution Comments: very HOH Restrictions Weight Bearing Restrictions:  (sternal precautions)    Mobility  Bed Mobility                  Transfers Overall transfer level: Needs assistance Equipment used: Pushed w/c Transfers: Sit to/from Stand Sit to Stand: Min assist;From elevated surface;+2 safety/equipment;Mod assist         General transfer comment: max cues for sternal precautions with poor understanding; mod from recliner with cushion; min from taller [facility]  Ambulation/Gait Ambulation/Gait assistance: Min assist;+2 safety/equipment Ambulation Distance (Feet): 30 Feet (5 ft then needed BSC, then 30 ft) Assistive device:  (push W/C) Gait Pattern/deviations: Step-through pattern;Decreased stride length;Shuffle;Trunk flexed   Gait velocity interpretation: Below normal speed for  age/gender General Gait Details: max tactile and verbal cues for upright posture; pt pushign w/c too far ahead; tends to look at his feet   Stairs            Wheelchair Mobility    Modified Rankin (Stroke Patients Only)       Balance Overall balance assessment: Needs assistance         Standing balance support: Bilateral upper extremity supported Standing balance-Leahy Scale: Poor                      Cognition Arousal/Alertness: Awake/alert Behavior During Therapy: Flat affect Overall Cognitive Status: Difficult to assess                      Exercises      General Comments        Pertinent Vitals/Pain HR 84-122  SaO2 >92% on RA  Pain Assessment: No/denies pain    Home Living                      Prior Function            PT Goals (current goals can now be found in the care plan section) Acute Rehab PT Goals Patient Stated Goal: go hunting Time For Goal Achievement: 11/16/14 Progress towards PT goals: Progressing toward goals    Frequency  Min 3X/week    PT Plan Current plan remains appropriate    Co-evaluation             End of Session Equipment Utilized During Treatment: Gait belt Activity Tolerance: Patient limited by fatigue Patient left: in chair;with call bell/phone within reach;with chair alarm set  Time: 1610-9604 PT Time Calculation (min) (ACUTE ONLY): 30 min  Charges:  $Gait Training: 8-22 mins $Therapeutic Activity: 8-22 mins                    G Codes:      SASSER,LYNN 11/14/2014, 11:17 AM  Pager [phone]

## 2014-11-04 NOTE — Progress Notes (Signed)
ANTICOAGULATION CONSULT NOTE - Follow Up Consult  Pharmacy Consult for heparin  Indication: atrial fibrillation  No Known Allergies  Patient Measurements: Height:  (185.4 cm) Weight: 221 lb 5.5 oz (100.4 kg) IBW/kg (Calculated) : 79.9   Vital Signs: Temp: 96.9 F (36.1 C) (09/21 1139) Temp Source: Oral (09/21 1139) BP: 143/83 mmHg (09/21 0800) Pulse Rate: 88 (09/21 0800)  Labs:  Recent Labs  11/02/14 0417  11/03/14 0354 11/03/14 1453 11/04/14 0310 11/04/14 0925  HGB 9.2*  --  8.9*  --  8.8*  --   HCT 26.9*  --  26.0*  --  26.1*  --   PLT 126*  --  108*  --  150  --   LABPROT  --   --   --   --  16.0*  --   INR  --   --   --   --  1.27  --   HEPARINUNFRC  --   < > 0.41 0.50 0.59  --   CREATININE 1.49*  --  1.56*  --   --  1.27*  < > = values in this interval not displayed.  Estimated Creatinine Clearance: 53 mL/min (by C-G formula based on Cr of 1.27).   Medications:  Scheduled:  . docusate sodium  200 mg Oral Daily  . insulin detemir  15 Units Subcutaneous Daily  . metoprolol tartrate  12.5 mg Oral BID  . pantoprazole  40 mg Oral Daily  . pravastatin  80 mg Oral QHS  . sodium chloride  3 mL Intravenous Q12H  . warfarin  5 mg Oral ONCE-1800  . Warfarin - Physician Dosing Inpatient   Does not apply q1800   Infusions:     Assessment: 81 y.o. male s/p CABG 9/15, h/o Afib on pradaxa PTA and now on heparin infusion at 1300 units. HL this AM was therapeutic. Cardiology has started Coumadin today. Hgb remains low but stable at 8.8 today. Plt up to 150K.   Goal of Therapy:  Heparin level 0.3-0.7 units/ml Monitor platelets by anticoagulation protocol: Yes   Plan:   -Continue heparin infusion at 1300 units/hr -Monitor daily heparin level, CBC and s/s of bleeding  -Coumadin 5 mg x 1 dose today   -Monitor daily INR  -Educate patient on Coumadin when fully oriented    Vinnie Level, PharmD., BCPS Clinical Pharmacist Pager [phone]

## 2014-11-04 NOTE — Progress Notes (Signed)
      [address]       [place] [address]             [phone]      Resting comfortably  BP 140/86 mmHg  Pulse 76  Temp(Src) 97 F (36.1 C) (Oral)  Resp 25  Ht  (1.854 m)  Wt 221 lb 5.5 oz (100.4 kg)  BMI 29.21 kg/m2  SpO2 98%   Intake/Output Summary (Last 24 hours) at 11/04/14 1757 Last data filed at 11/04/14 0818  Gross per 24 hour  Intake   1595 ml  Output   1425 ml  Net    170 ml    Awaiting bed on 2 west  Steven C. Dorris Fetch, MD [facility] [phone]

## 2014-11-04 NOTE — Progress Notes (Addendum)
TCTS DAILY ICU PROGRESS NOTE                   [address]            [place] [address]          [phone]   6 Days Post-Op Procedure(s) (LRB): EXPLORATION POST OPERATIVE OPEN HEART (N/A)  Total Length of Stay:  LOS: 6 days   Subjective: Pleasant, still not oriented to place, thinks he is in [facility]. No new issues.   Objective: Vital signs in last 24 hours: Temp:  [96.9 F (36.1 C)-98.4 F (36.9 C)] 98 F (36.7 C) (09/21 0751) Pulse Rate:  [44-88] 88 (09/21 0800) Cardiac Rhythm:  [-] Atrial fibrillation (09/21 0800) Resp:  [14-31] 25 (09/21 0800) BP: (110-157)/(54-95) 143/83 mmHg (09/21 0800) SpO2:  [97 %-100 %] 99 % (09/21 0800) Weight:  [221 lb 5.5 oz (100.4 kg)] 221 lb 5.5 oz (100.4 kg) (09/21 0512)  Filed Weights   11/02/14 0416 11/03/14 0500 11/04/14 0512  Weight: 218 lb 0.6 oz (98.9 kg) 221 lb 1.9 oz (100.3 kg) 221 lb 5.5 oz (100.4 kg)  BASELINE WEIGHT: 94 kg  Weight change: 3.5 oz (0.1 kg)   Hemodynamic parameters for last 24 hours:    Intake/Output from previous day: 09/20 0701 - 09/21 0700 In: 2835 [P.O.:1080; I.V.:1705; IV Piggyback:50] Out: 1878 [Urine:1875; Stool:3]  CBGs [id number]    Total I/O In: 113 [I.V.:63; IV Piggyback:50] Out: 325 [Urine:325]  Current Meds: Scheduled Meds: . sodium chloride  10 mL/hr Intravenous Once  . insulin aspart  0-24 Units Subcutaneous 6 times per day  . insulin detemir  15 Units Subcutaneous Daily  . metoprolol tartrate  12.5 mg Oral BID   Or  . metoprolol tartrate  12.5 mg Per Tube BID  . pantoprazole  40 mg Oral Daily  . potassium chloride  10 mEq Intravenous Q1 Hr x 3  . pravastatin  80 mg Oral QHS  . sodium chloride  10-40 mL Intracatheter Q12H  . sodium chloride  3 mL Intravenous Q12H  . Warfarin - Physician Dosing Inpatient   Does not apply q1800   Continuous Infusions: . sodium chloride 20 mL/hr at 10/30/14 1600  . sodium chloride 250 mL (10/30/14 0505)  . sodium  chloride 50 mL/hr at 11/04/14 0510  . dexmedetomidine Stopped (11/03/14 0800)  . heparin 1,300 Units/hr (11/04/14 0048)  . lactated ringers 20 mL/hr at 10/30/14 2000  . lactated ringers     PRN Meds:.sodium chloride, Gerhardt's butt cream, metoprolol, ondansetron (ZOFRAN) IV, sodium chloride, sodium chloride   Physical Exam: General appearance: alert, cooperative and no distress Heart: irregularly irregular rhythm Lungs: clear to auscultation bilaterally Extremities: Mild LE edema Wound: Clean and dry   Lab Results: CBC:  Recent Labs  11/03/14 0354 11/04/14 0310  WBC 8.2 9.0  HGB 8.9* 8.8*  HCT 26.0* 26.1*  PLT 108* 150   BMET:   Recent Labs  11/02/14 0417 11/03/14 0354  NA 132* 134*  K 3.7 3.3*  CL 98* 102  CO2 22 23  GLUCOSE 167* 89  BUN 30* 38*  CREATININE 1.49* 1.56*  CALCIUM 8.5* 8.2*    PT/INR:   Recent Labs  11/04/14 0310  LABPROT 16.0*  INR 1.27   Radiology: No results found.   Assessment/Plan: S/P Procedure(s) (LRB): EXPLORATION POST OPERATIVE OPEN HEART (N/A)  CV- Chronic AF, rate controlled. Continue Lopressor/Coumadin/Heparin.  Postop delirium- resolving. Continue to watch. Off all narcotics.  Renal- labs not back yet- will follow renal function. Cr had gotten up to 1.7 as OP, but was 1.1 on admission.  Hypokalemia- follow up labs.  Expected postop blood loss anemia- H/h generally stable. Continue to monitor.  PT/reconditioning.  DM- CBGs stable. Continue Levemir, resume po meds on eating better.  Hopefully can tx to stepdown later today if he is ambulating and renal function is stable.   COLLINS,GINA H 11/04/2014 9:12 AM   Chart reviewed, patient examined, agree with above. He is improving daily. Eating better. Creat today is down to 1.27. Mental status much better but still a little confused. Therapeutic on heparin drip and coumadin started. Would give 5 mg today. I think he can go to 2W stepdown and start ambulating.

## 2014-11-05 ENCOUNTER — Inpatient Hospital Stay (HOSPITAL_COMMUNITY): Payer: Medicare Other

## 2014-11-05 LAB — BASIC METABOLIC PANEL
Anion gap: 9 (ref 5–15)
BUN: 20 mg/dL (ref 6–20)
CALCIUM: 8.3 mg/dL — AB (ref 8.9–10.3)
CO2: 22 mmol/L (ref 22–32)
CREATININE: 1.16 mg/dL (ref 0.61–1.24)
Chloride: 102 mmol/L (ref 101–111)
GFR calc Af Amer: >60 mL/min (ref >60)
GFR, EST NON AFRICAN AMERICAN: 56 mL/min — AB (ref >60)
GLUCOSE: 152 mg/dL — AB (ref 65–99)
POTASSIUM: 3.6 mmol/L (ref 3.5–5.1)
SODIUM: 133 mmol/L — AB (ref 135–145)

## 2014-11-05 LAB — GLUCOSE, CAPILLARY
GLUCOSE-CAPILLARY: 191 mg/dL — AB (ref 65–99)
GLUCOSE-CAPILLARY: 127 mg/dL — AB (ref 65–99)
Glucose-Capillary: 108 mg/dL — ABNORMAL HIGH (ref 65–99)
Glucose-Capillary: 145 mg/dL — ABNORMAL HIGH (ref 65–99)
Glucose-Capillary: 143 mg/dL — ABNORMAL HIGH (ref 65–99)

## 2014-11-05 LAB — HEPARIN LEVEL (UNFRACTIONATED): HEPARIN UNFRACTIONATED: 0.38 [IU]/mL (ref 0.30–0.70)

## 2014-11-05 LAB — PROTIME-INR
INR: 1.29 (ref 0.00–1.49)
PROTHROMBIN TIME: 16.2 s — AB (ref 11.6–15.2)

## 2014-11-05 MED ORDER — WARFARIN SODIUM 7.5 MG PO TABS
7.5000 mg | ORAL_TABLET | Freq: Once | ORAL | Status: AC
Start: 2014-11-05 — End: 2014-11-05
  Administered 2014-11-05: 7.5 mg via ORAL
  Filled 2014-11-05: qty 1

## 2014-11-05 MED ORDER — METFORMIN HCL 500 MG PO TABS
1000.0000 mg | ORAL_TABLET | Freq: Two times a day (BID) | ORAL | Status: DC
  Administered 2014-11-05 – 2014-11-10 (×10): 1000 mg via ORAL
  Filled 2014-11-05 (×13): qty 2

## 2014-11-05 MED ORDER — INSULIN ASPART 100 UNIT/ML ~~LOC~~ SOLN
0.0000 [IU] | Freq: Three times a day (TID) | SUBCUTANEOUS | Status: DC
  Administered 2014-11-05 (×2): 2 [IU] via SUBCUTANEOUS
  Administered 2014-11-05: 3 [IU] via SUBCUTANEOUS
  Administered 2014-11-06: 2 [IU] via SUBCUTANEOUS
  Administered 2014-11-07 (×2): 3 [IU] via SUBCUTANEOUS
  Administered 2014-11-08: 2 [IU] via SUBCUTANEOUS
  Administered 2014-11-08: 3 [IU] via SUBCUTANEOUS

## 2014-11-05 MED ORDER — INSULIN ASPART 100 UNIT/ML ~~LOC~~ SOLN: 0.0000 [IU] | Freq: Every day | SUBCUTANEOUS | Status: DC

## 2014-11-05 MED ORDER — FUROSEMIDE 40 MG PO TABS
40.0000 mg | ORAL_TABLET | Freq: Every day | ORAL | Status: DC
  Administered 2014-11-05 – 2014-11-10 (×6): 40 mg via ORAL
  Filled 2014-11-05 (×4): qty 1
  Filled 2014-11-05: qty 2
  Filled 2014-11-05 (×2): qty 1

## 2014-11-05 MED ORDER — METOPROLOL TARTRATE 25 MG PO TABS
25.0000 mg | ORAL_TABLET | Freq: Two times a day (BID) | ORAL | Status: DC
  Administered 2014-11-05 – 2014-11-07 (×6): 25 mg via ORAL
  Filled 2014-11-05 (×7): qty 1

## 2014-11-05 MED ORDER — POTASSIUM CHLORIDE CRYS ER 20 MEQ PO TBCR
20.0000 meq | EXTENDED_RELEASE_TABLET | Freq: Two times a day (BID) | ORAL | Status: DC
  Administered 2014-11-05 – 2014-11-10 (×11): 20 meq via ORAL
  Filled 2014-11-05 (×13): qty 1

## 2014-11-05 NOTE — Clinical Documentation Improvement (Signed)
Cardiothoracic  Abnormal Lab/Test Results:   Component      Creatinine  Latest Ref Rng      0.61 - 1.24 mg/dL  1/61/0960     4:54 AM 1.55 (H)  10/29/2014     8:22 AM 1.00  10/29/2014     9:49 AM 1.10  10/29/2014     10:27 AM 1.10  10/29/2014     12:27 PM 1.10  10/29/2014     1:28 PM 1.00  10/29/2014     7:30 PM 1.09  10/29/2014     7:44 PM 0.90  10/29/2014     11:45 PM 1.04  10/30/2014     5:05 AM 1.03  10/30/2014     4:26 PM 1.30 (H)  10/30/2014     4:30 PM 1.39 (H)  10/31/2014     4:20 AM 1.53 (H)  11/01/2014     3:45 AM 1.42 (H)  11/02/2014     4:17 AM 1.49 (H)  11/03/2014     3:54 AM 1.56 (H)  11/04/2014     9:25 AM 1.27 (H)  11/05/2014     6:20 AM 1.16    Possible Clinical Conditions associated with below indicators  Acute Kidney Injury on Chronic Kidney Disease (if present, please specify cause / type of aki if known and stage of ckd)  Chronic kidney disease only (as currently documented)  Other Condition  Cannot Clinically Determine   Supporting Information: Chronic kidney disease currently documented  / stage not specified  Please exercise your independent, professional judgment when responding. A specific answer is not anticipated or expected.   Thank you, Doy Mince, RN [phone] Clinical Documentation Specialist

## 2014-11-05 NOTE — Clinical Social Work Note (Signed)
CSW spoke with patient's son [name] who is in agreement to having patient go to SNF for short term rehab.  Patient's son would patient to go to a SNF close to the hospital.  Patient's son requested the SNF list be emailed to him so he can review the available SNFs.  CSW explained to patient the process for SNF placement and how insurances pays for his stay.  CSW explained to patient's son that the goals is for patient to get his strength up by receiving therapy at a SNF so he can return back home.  Formal assessment to follow, CSW to continue to work on discharge planning for patient.  Ervin Knack. Anterhaus, MSW, [facility] [phone] 11/05/2014 3:36 PM

## 2014-11-05 NOTE — Progress Notes (Signed)
7 Days Post-Op Procedure(s) (LRB): EXPLORATION POST OPERATIVE OPEN HEART (N/A) Subjective:  No complaints  Objective: Vital signs in last 24 hours: Temp:  [96.9 F (36.1 C)-100.4 F (38 C)] 97.5 F (36.4 C) (09/22 0400) Pulse Rate:  [68-95] 95 (09/22 0400) Cardiac Rhythm:  [-] Atrial fibrillation (09/22 0400) Resp:  [22-27] 26 (09/22 0600) BP: (138-158)/(81-97) 158/92 mmHg (09/22 0600) SpO2:  [98 %-100 %] 98 % (09/22 0400) Weight:  [101.5 kg (223 lb 12.3 oz)] 101.5 kg (223 lb 12.3 oz) (09/22 0500)  Hemodynamic parameters for last 24 hours:    Intake/Output from previous day: 09/21 0701 - 09/22 0700 In: 652 [I.V.:602; IV Piggyback:50] Out: 1075 [Urine:1075] Intake/Output this shift:    General appearance: alert and cooperative Neurologic: non-focal but some confusion. Knows who I am. Heart: irregular rate and rhythm, S1, S2 normal, no murmur, click, rub or gallop Lungs: clear to auscultation bilaterally Extremities: edema mild Wound: incisions ok  Lab Results:  Recent Labs  11/03/14 0354 11/04/14 0310  WBC 8.2 9.0  HGB 8.9* 8.8*  HCT 26.0* 26.1*  PLT 108* 150   BMET:  Recent Labs  11/04/14 0925 11/05/14 0620  NA 136 133*  K 3.5 3.6  CL 105 102  CO2 24 22  GLUCOSE 158* 152*  BUN 31* 20  CREATININE 1.27* 1.16  CALCIUM 8.3* 8.3*    PT/INR:  Recent Labs  11/05/14 0620  LABPROT 16.2*  INR 1.29   ABG    Component Value Date/Time   PHART 7.427 10/30/2014 1131   HCO3 23.1 10/30/2014 1131   TCO2 20 10/30/2014 1626   ACIDBASEDEF 1.0 10/30/2014 1131   O2SAT 97.0 10/30/2014 1131   CBG (last 3)   Recent Labs  11/04/14 1934 11/04/14 2353 11/05/14 0411  GLUCAP 141* 126* 108*    Assessment/Plan: S/P Procedure(s) (LRB): EXPLORATION POST OPERATIVE OPEN HEART (N/A)  He has been hemodynamically stable. BP 130-150 but probably ok given his age.  A-fib with controlled rate. Continue heparin until therapeutic on coumadin.   Creat back to normal  and  Weight still 16 lbs over preop so will start diuretic.  Awaiting bed on 2W stepdown.  Mobilization, IS  Diabetes under good control   LOS: 7 days    [name] 11/05/2014

## 2014-11-05 NOTE — Progress Notes (Signed)
Patient ID: Frank Marks, male   DOB: May 13, 1926, 81 y.o.   MRN: [id number]  SICU Evening Rounds:  Stable day. Still some confusion but moving well and eating. Awaiting bed in 2W stepdown.

## 2014-11-05 NOTE — Progress Notes (Signed)
Physical Therapy Treatment Patient Details Name: Frank Marks MRN: [id number] DOB: Nov 15, 1926 Today's Date: 11/05/2014    History of Present Illness 81 year old gentleman with a history of hypertension, hypercholesterolemia, acute on chronic diastolic heart failure, chronic atrial fibrillation, and chronic diastolic heart failure with progressive shortness of breath, exertional fatigue, and substernal chest discomfort. Admitted for CABGx 4 and AVR performed 9/15 with return to OR 9/16 for exploration secondary to mediastinal bleeding. Post op course complicated by AMS    PT Comments    Remains pleasantly confused. Now has sitter as trying to get up by himself. Max encouragement to walk.  Follow Up Recommendations  SNF;Supervision/Assistance - 24 hour     Equipment Recommendations  Other (comment) (TBD)    Recommendations for Other Services       Precautions / Restrictions Precautions Precautions: Sternal;Fall Precaution Comments: very HOH Restrictions Weight Bearing Restrictions:  (sternal precautions)    Mobility  Bed Mobility Overal bed mobility: Needs Assistance Bed Mobility: Rolling;Sidelying to Sit Rolling: Min assist Sidelying to sit: Max assist       General bed mobility comments: cues for sequence with assist to maintain sternal precautions and not utilizing upper body. Assist to bring legs off of bed and fully elevate trunk  Transfers Overall transfer level: Needs assistance Equipment used: Pushed w/c Transfers: Sit to/from Stand;Stand Pivot Transfers Sit to Stand: Min assist;From elevated surface;+2 safety/equipment Stand pivot transfers: Min assist       General transfer comment: pt unable to remember sternal precautions; asked pt to hold onto PT's arms as sit to stand with very little pulling by pt; x3  Ambulation/Gait Ambulation/Gait assistance: Mod assist;+2 safety/equipment Ambulation Distance (Feet): 50 Feet Assistive device:  (push W/C) Gait  Pattern/deviations: Step-through pattern;Decreased stride length;Shuffle;Trunk flexed   Gait velocity interpretation: Below normal speed for age/gender General Gait Details: max tactile and verbal cues for upright posture; pt pushign w/c too far ahead; tends to look at his feet   Stairs            Wheelchair Mobility    Modified Rankin (Stroke Patients Only)       Balance Overall balance assessment: Needs assistance         Standing balance support: Bilateral upper extremity supported Standing balance-Leahy Scale: Poor                      Cognition Arousal/Alertness: Awake/alert Behavior During Therapy: Flat affect Overall Cognitive Status: Difficult to assess                      Exercises      General Comments        Pertinent Vitals/Pain      Home Living                      Prior Function            PT Goals (current goals can now be found in the care plan section) Acute Rehab PT Goals Patient Stated Goal: go hunting Time For Goal Achievement: 11/16/14 Progress towards PT goals: Progressing toward goals    Frequency  Min 3X/week    PT Plan Current plan remains appropriate    Co-evaluation             End of Session Equipment Utilized During Treatment: Gait belt Activity Tolerance: Patient limited by fatigue Patient left: in chair;with call bell/phone within reach;with nursing/sitter in room  Time: 1610-9604 PT Time Calculation (min) (ACUTE ONLY): 36 min  Charges:  $Gait Training: 8-22 mins $Therapeutic Activity: 8-22 mins                    G Codes:      SASSER,LYNN 2014/11/06, 3:27 PM Pager [phone]

## 2014-11-05 NOTE — Progress Notes (Signed)
Pt has been confused all night, several attempts to get out of bed trying to go home.  Pt unable to be re-oriented, ensured bed alarm on for safety.  Pt assisted this am to chair with 2 person assist and chair alarm turned on.  Will continue to monitor.  Roselie Awkward, RN

## 2014-11-05 NOTE — Care Management Important Message (Signed)
Important Message  Patient Details  Name: WINIFRED BALOGH MRN: [id number] Date of Birth: [birth year]   Medicare Important Message Given:  Yes-third notification given    Orson Aloe 11/05/2014, 9:12 AM

## 2014-11-05 NOTE — Progress Notes (Signed)
ANTICOAGULATION CONSULT NOTE - Follow Up Consult  Pharmacy Consult for heparin  Indication: atrial fibrillation  No Known Allergies  Patient Measurements: Height:  (185.4 cm) Weight: 223 lb 12.3 oz (101.5 kg) IBW/kg (Calculated) : 79.9   Vital Signs: Temp: 97.4 F (36.3 C) (09/22 0700) Temp Source: Oral (09/22 0400) BP: 136/98 mmHg (09/22 0800) Pulse Rate: 95 (09/22 0400)  Labs:  Recent Labs  11/03/14 0354 11/03/14 1453 11/04/14 0310 11/04/14 0925 11/05/14 0620  HGB 8.9*  --  8.8*  --   --   HCT 26.0*  --  26.1*  --   --   PLT 108*  --  150  --   --   LABPROT  --   --  16.0*  --  16.2*  INR  --   --  1.27  --  1.29  HEPARINUNFRC 0.41 0.50 0.59  --  0.38  CREATININE 1.56*  --   --  1.27* 1.16    Estimated Creatinine Clearance: 58.3 mL/min (by C-G formula based on Cr of 1.16).   Medications:  Infusions:  . heparin 1,300 Units/hr (11/05/14 0800)    Assessment: 81 y.o. male s/p CABG 9/15, h/o Afib on pradaxa PTA and now on heparin infusion at 1300 units. HL this AM was therapeutic at 0.38c. Coumadin started, dosing per TCTS. No bleeding noted, CBC ordered but not drawn this morning?   Goal of Therapy:  Heparin level 0.3-0.7 units/ml Monitor platelets by anticoagulation protocol: Yes   Plan:   -Continue heparin infusion at 1300 units/hr -Monitor daily heparin level, CBC and s/s of bleeding   -Educate patient on Coumadin when fully oriented   [facility], Vina.D., BCPS Clinical Pharmacist Pager: [phone] 11/05/2014 10:33 AM

## 2014-11-06 ENCOUNTER — Other Ambulatory Visit: Payer: Self-pay | Admitting: Cardiovascular Disease

## 2014-11-06 LAB — CBC
HEMATOCRIT: 25.6 % — AB (ref 39.0–52.0)
Hemoglobin: 8.7 g/dL — ABNORMAL LOW (ref 13.0–17.0)
MCH: 29.5 pg (ref 26.0–34.0)
MCHC: 34 g/dL (ref 30.0–36.0)
MCV: 86.8 fL (ref 78.0–100.0)
Platelets: 157 10*3/uL (ref 150–400)
RBC: 2.95 MIL/uL — ABNORMAL LOW (ref 4.22–5.81)
RDW: 14.9 % (ref 11.5–15.5)
WBC: 17.3 10*3/uL — ABNORMAL HIGH (ref 4.0–10.5)

## 2014-11-06 LAB — GLUCOSE, CAPILLARY
GLUCOSE-CAPILLARY: 120 mg/dL — AB (ref 65–99)
GLUCOSE-CAPILLARY: 142 mg/dL — AB (ref 65–99)
Glucose-Capillary: 140 mg/dL — ABNORMAL HIGH (ref 65–99)
Glucose-Capillary: 120 mg/dL — ABNORMAL HIGH (ref 65–99)

## 2014-11-06 LAB — HEPARIN LEVEL (UNFRACTIONATED)
Heparin Unfractionated: 0.22 IU/mL — ABNORMAL LOW (ref 0.30–0.70)
Heparin Unfractionated: 0.46 IU/mL (ref 0.30–0.70)

## 2014-11-06 LAB — PROTIME-INR
INR: 1.5 — ABNORMAL HIGH (ref 0.00–1.49)
Prothrombin Time: 18.2 seconds — ABNORMAL HIGH (ref 11.6–15.2)

## 2014-11-06 MED ORDER — GLIMEPIRIDE 4 MG PO TABS
4.0000 mg | ORAL_TABLET | Freq: Two times a day (BID) | ORAL | Status: DC
  Administered 2014-11-06 – 2014-11-10 (×9): 4 mg via ORAL
  Filled 2014-11-06 (×11): qty 1

## 2014-11-06 MED ORDER — WARFARIN SODIUM 7.5 MG PO TABS
7.5000 mg | ORAL_TABLET | Freq: Once | ORAL | Status: AC
  Administered 2014-11-06: 7.5 mg via ORAL
  Filled 2014-11-06 (×2): qty 1

## 2014-11-06 NOTE — Progress Notes (Signed)
11/06/2014 1145 Received pt to room 2w20 from 2S.  Pt is alert & oriented to self and place.  No c/o voiced.  Tele monitor applied and CCMD notified.  Oriented to room, call light and bed.  Call bell in reach and bed alarm on. Kathryne Hitch

## 2014-11-06 NOTE — Clinical Social Work Placement (Addendum)
   CLINICAL SOCIAL WORK PLACEMENT  NOTE  Date:  11/06/2014  Patient Details  Name: Frank Marks MRN: [id number] Date of Birth: [birth year]  Clinical Social Work is seeking post-discharge placement for this patient at the Skilled  Nursing Facility level of care (*CSW will initial, date and re-position this form in  chart as items are completed):  Yes   Patient/family provided with [facility] Clinical Social Work Department's list of facilities offering this level of care within the geographic area requested by the patient (or if unable, by the patient's family).  Yes   Patient/family informed of their freedom to choose among providers that offer the needed level of care, that participate in Medicare, Medicaid or managed care program needed by the patient, have an available bed and are willing to accept the patient.  Yes   Patient/family informed of [facility]'s ownership interest in [facility] and [facility], as well as of the fact that they are under no obligation to receive care at these facilities.  PASRR submitted to EDS on 11/06/14     PASRR number received on 11/06/14     Existing PASRR number confirmed on       FL2 transmitted to all facilities in geographic area requested by pt/family on 11/06/14     FL2 transmitted to all facilities within larger geographic area on       Patient informed that his/her managed care company has contracts with or will negotiate with certain facilities, including the following:         11/07/14   Patient/family informed of bed offers received.  Patient chooses bed at  [facility]     Physician recommends and patient chooses bed at      Patient to be transferred to  [facility] on  11/10/14.  Patient to be transferred to facility by  [facility]     Patient family notified on  11/10/14 of transfer.  Name of family member notified:   Ares Cardozo     PHYSICIAN Please sign FL2     Additional Comment:     _______________________________________________ Ervin Knack. Anterhaus, MSW, Theresia Majors [phone] 11/10/2014 3:36 PM

## 2014-11-06 NOTE — Progress Notes (Addendum)
TCTS DAILY ICU PROGRESS NOTE                   [address]            [place] [address]          [phone]   8 Days Post-Op Procedure(s) (LRB): EXPLORATION POST OPERATIVE OPEN HEART (N/A)  Total Length of Stay:  LOS: 8 days   Subjective: OOB in chair, pleasantly confused. No new issues.   Objective: Vital signs in last 24 hours: Temp:  [96.7 F (35.9 C)-98.7 F (37.1 C)] 98 F (36.7 C) (09/22 2338) Pulse Rate:  [62-96] 82 (09/23 0600) Cardiac Rhythm:  [-] Atrial fibrillation (09/23 0400) Resp:  [18-29] 18 (09/23 0600) BP: (100-160)/(58-98) 132/81 mmHg (09/23 0600) SpO2:  [94 %-100 %] 99 % (09/23 0600) Weight:  [222 lb 7.1 oz (100.9 kg)] 222 lb 7.1 oz (100.9 kg) (09/23 0600)  Filed Weights   11/04/14 0512 11/05/14 0500 11/06/14 0600  Weight: 221 lb 5.5 oz (100.4 kg) 223 lb 12.3 oz (101.5 kg) 222 lb 7.1 oz (100.9 kg)    Weight change: -1 lb 5.2 oz (-0.6 kg)   Hemodynamic parameters for last 24 hours:    Intake/Output from previous day: 09/22 0701 - 09/23 0700 In: 894 [P.O.:180; I.V.:714] Out: 1876 [Urine:1875; Stool:1]  CBGs 127-143     Current Meds: Scheduled Meds: . docusate sodium  200 mg Oral Daily  . furosemide  40 mg Oral Daily  . insulin aspart  0-15 Units Subcutaneous TID WC  . insulin aspart  0-5 Units Subcutaneous QHS  . insulin detemir  15 Units Subcutaneous Daily  . metFORMIN  1,000 mg Oral BID WC  . metoprolol tartrate  25 mg Oral BID  . pantoprazole  40 mg Oral Daily  . potassium chloride  20 mEq Oral BID  . pravastatin  80 mg Oral QHS  . sodium chloride  3 mL Intravenous Q12H  . Warfarin - Physician Dosing Inpatient   Does not apply q1800   Continuous Infusions: . heparin 1,500 Units/hr (11/06/14 0700)   PRN Meds:.sodium chloride, acetaminophen, bisacodyl **OR** bisacodyl, Gerhardt's butt cream, Influenza vac split quadrivalent PF, ondansetron (ZOFRAN) IV, sodium chloride  Physical Exam: General appearance: alert,  cooperative and no distress Heart: irregularly irregular rhythm Lungs: clear to auscultation bilaterally Extremities: + LE edema Wound: Clean and dry    Lab Results: CBC: Recent Labs  11/04/14 0310 11/06/14 0243  WBC 9.0 17.3*  HGB 8.8* 8.7*  HCT 26.1* 25.6*  PLT 150 157   BMET:  Recent Labs  11/04/14 0925 11/05/14 0620  NA 136 133*  K 3.5 3.6  CL 105 102  CO2 24 22  GLUCOSE 158* 152*  BUN 31* 20  CREATININE 1.27* 1.16  CALCIUM 8.3* 8.3*    PT/INR:  Recent Labs  11/06/14 0243  LABPROT 18.2*  INR 1.50*   Radiology: No results found.   Assessment/Plan: S/P Procedure(s) (LRB): EXPLORATION POST OPERATIVE OPEN HEART (N/A)  CV- Chronic AF, rate controlled. Continue Lopressor/Coumadin/Heparin. BPs generally stable.  Postop delirium- remains pleasantly confused, otherwise neurologically intact. Continue to watch. Off all narcotics.  AKI- Cr back down to baseline.  Leukocytosis, no fever. Likely atelectasis. Will follow labs.  Hypokalemia- replace K.  Expected postop blood loss anemia- H/h generally stable. Continue to monitor.  PT/reconditioning.  DM- CBGs stable. Will d/c Levemir, continue home meds.  Awaiting tx stepdown.  Disp- will need SNF at discharge. SW already assisting  with placement.   COLLINS,GINA H 11/06/2014 7:37 AM  Chart reviewed, patient examined, agree with above. Leukocytosis is new but no fever and incisions look good. PICC line is only 76 days old and foley is out. Continue IS, ambulation. Repeat in am.

## 2014-11-06 NOTE — Progress Notes (Signed)
ANTICOAGULATION CONSULT NOTE - Follow Up Consult  Pharmacy Consult for heparin  Indication: atrial fibrillation  No Known Allergies  Patient Measurements: Height:  (185.4 cm) Weight: 223 lb 12.3 oz (101.5 kg) IBW/kg (Calculated) : 79.9   Vital Signs: Temp: 98 F (36.7 C) (09/22 2338) Temp Source: Oral (09/22 2338) BP: 160/81 mmHg (09/23 0200) Pulse Rate: 96 (09/23 0200)  Labs:  Recent Labs  11/04/14 0310 11/04/14 0925 11/05/14 0620 11/06/14 0243  HGB 8.8*  --   --  8.7*  HCT 26.1*  --   --  25.6*  PLT 150  --   --  157  LABPROT 16.0*  --  16.2*  --   INR 1.27  --  1.29  --   HEPARINUNFRC 0.59  --  0.38 0.22*  CREATININE  --  1.27* 1.16  --     Estimated Creatinine Clearance: 58.3 mL/min (by C-G formula based on Cr of 1.16).   Medications:  Infusions:  . heparin 1,300 Units/hr (11/05/14 1900)    Assessment: 81 y.o. male s/p CABG 9/15, h/o Afib on pradaxa PTA. Now on heparin bridging to coumadin. Heparin level subtherapeutic (0.22) this a.m. Hgb low but stable. No issues with line or bleeding reported per RN.  Goal of Therapy:  Heparin level 0.3-0.7 units/ml Monitor platelets by anticoagulation protocol: Yes   Plan:   -Increase heparin infusion to 1500 units/hr -Will f/u 8 hr heparin level  Christoper Fabian, PharmD, BCPS Clinical pharmacist, pager [phone] 11/06/2014 3:54 AM

## 2014-11-06 NOTE — Discharge Summary (Signed)
[address]       [place] [address]             [phone]              Discharge Summary  Name: Frank Marks DOB: 1926/12/22 81 y.o. MRN: [id number]   Admission Date: 10/29/2014 Discharge Date: 11/10/2014    Admitting Diagnosis: Severe multivessel coronary artery disease Severe aortic stenosis   Discharge Diagnosis:  Severe multivessel coronary artery disease Severe aortic stenosis Expected postoperative blood loss anemia Postoperative delirium Acute kidney injury Elevated Vitamin B 12  Past Medical History  Diagnosis Date  . Hypertension   . Hypercholesterolemia   . Chronic back pain   . Hx of cardiovascular stress test     Lex MV 3/14:  Not gated, apical thinning, no ischemia.   Marland Kitchen Hx of echocardiogram     Echo 04/24/12: mild LVH, EF 50-55%, mild AS (mean 9), MAC, mild MR, mod-severe BAE, mod TR, PASP 39  . Acute on chronic diastolic CHF (congestive heart failure), NYHA class 3 07/29/2014  . TIA (transient ischemic attack) 06/2007    "had 2-3 little ones around that time"  . Carotid artery stenosis   . Chronic atrial fibrillation   . Type II diabetes mellitus   . Osteoarthritis   . Arthritis     "back" (07/29/2014)  . Chronic lower back pain   . Exertional shortness of breath     chronic/notes 07/29/2014  . Pneumonia ~ 1951; 02/2014  . Dysrhythmia     afib  . Heart murmur   . Stroke     ministroke- 2xs  . Anxiety     short periods of nervousness, reported since need for heart care, he feels anxious on & off.    Marland Kitchen COPD (chronic obstructive pulmonary disease)      Procedures: CORONARY ARTERY BYPASS GRAFTING X 4 - 10/29/2014  Left internal mammary artery to left anterior descending  Saphenous vein graft to obtuse marginal 1  Sequential saphenous vein graft to posterior descending and obtuse marginal 3  Endoscopic greater saphenous vein harvest right leg AORTIC VALVE REPLACEMENT   25 mm Magna Ease pericardial tissue  valve  EXPLORATION OF MEDIASTINUM AND LIGATION OF STERNAL BLEEDING SITE - 10/29/2014    HPI:  The patient is a 81 y.o. male with a history of hypertension, hypercholesterolemia, acute on chronic diastolic heart failure, chronic atrial fibrillation, and chronic diastolic heart failure who presented in June 2016 with progressive shortness of breath, exertional fatigue, and substernal chest discomfort. He underwent cardiac catheterization demonstrating severe diffuse proximal and mid LAD stenosis, total occlusion of the right coronary artery, and severe stenosis of the obtuse marginal branch of the left circumflex. He had mild LV dysfunction with an EF of 45-50%. His echo in 03/2014 showed mild aortic stenosis with a mean gradient of 17 mm Hg with mild MR. A decision was made to continue medical treatment. He has continued to be very limited in his activity due to shortness of breath, chest pain and weakness. He has been taking NTG every day recently and sometimes 3 per day. He was seen by Dr. Kendrick Fries and had PFT's which showed mild obstruction and mild restriction with a severe diffusion defect of 38% of predicted. He felt that his shortness of breath was out of proportion to his pulmonary function.  A repeat echo on 10/16/2014 shows moderate to severe AS with a mean gradient of only 17 mm  Hg but a calculated valve area of only 1.0 cm2 and a dimensionless index of .29. His valve is grossly abnormal with fusion of the noncoronary and left coronary leaflets and his EF is reduced to 45% with diffuse hypokinesis suggesting low flow severe AS. The patient was referred to Dr. Laneta Simmers for surgical consideration.  It was felt that he would benefit from CABG with AVR at this time. All risks, benefits and alternatives of surgery were explained in detail, and the patient agreed to proceed.      Hospital Course:  The patient was admitted to [facility] on 10/29/2014. The patient was taken to the operating room and  underwent the above procedure.    Early in the postoperative course, the patient was returned to the operating room for bleeding that persisted despite correction of postop coagulopathy. There was a spot on the right posterior sternal edge that was obviously bleeding, and this was controlled with a suture ligation.  The patient was returned to the ICU for further observation.  The patient developed rapid atrial fibrillation on post day 2, which was treated with IV Amiodarone. His rate stabilized and the Amiodarone was discontinued.  He was restarted on po Lopressor, which he had been on for chronic a-fib, and was started on Heparin and Coumadin.  He has had a mild postoperative delirium, which has been managed conservatively with discontinuation of narcotics.  He initially required Precedex and Haldol to decrease agitation, but these were weaned and discontinued as his mental status improved. He also developed acute kidney injury with peak creatinine of 1.56. This was monitored closely and has trended back down to baseline.   The patient was ready for transfer to stepdown on postop day 6.  He is ambulating in the halls with assistance and is tolerating a regular diet. He remains pleasantly confused at times, but overall is calm and cooperative. UA was negative for a UTI. His vitamin B12 level was elevated at 3362. He was taking vitamin B 12 at home. He is NOT to take this at discharge. He has stayed in rate controlled a-fib, and INR today is 2.23. Per Dr. Laneta Simmers, he will be continued on low dose Coumadin. His INR is to be between 2-2.5 and he should be on Coumadin for 3 months. After that, it can be decided if he should go back on Pradaxa.He has had a stable blood loss anemia, but has not required further transfusions. His last H and H was up to 8.4 and 25.4. He will be continued on oral iron and folic aid for one month. He did develop a leukocytosis up to 17.3 without fever on postop day 8, but there was no  evidence of acute infection on exam.  This has been monitored closely and his white blood count is now 10,500 .  Blood sugars have been controlled on home medications. Epicardial pacing wires and chest tube sutures have been removed. It is felt that the patient would benefit from short term SNF placement post-discharge, and he is presently medically stable for transfer when a bed is available.  We ask the SNF to please do the following: 1. Please obtain vital signs at least one time daily 2.Please weigh the patient daily. If he or she continues to gain weight or develops lower extremity edema, contact the office at [phone]. 3. Ambulate patient at least three times daily and please use sternal precautions.  Recent vital signs:  Filed Vitals:   11/10/14 0608  BP: 136/75  Pulse: 98  Temp: 97.4 F (36.3 C)  Resp: 18    Recent laboratory studies:  CBC:  Recent Labs  11/08/14 0529  WBC 10.5  HGB 8.4*  HCT 25.4*  PLT 157   BMET:   Recent Labs  11/08/14 0529  NA 134*  K 3.7  CL 101  CO2 24  GLUCOSE 188*  BUN 17  CREATININE 1.19  CALCIUM 8.7*    PT/INR:   Recent Labs  11/10/14 0527  LABPROT 24.5*  INR 2.23*     Discharge Medications:     Medication List    STOP taking these medications        CIPRO HC otic suspension  Generic drug:  ciprofloxacin-hydrocortisone     dabigatran 150 MG Caps capsule  Commonly known as:  PRADAXA     isosorbide mononitrate 60 MG 24 hr tablet  Commonly known as:  IMDUR     lisinopril 20 MG tablet  Commonly known as:  PRINIVIL,ZESTRIL     nitroGLYCERIN 0.4 MG SL tablet  Commonly known as:  NITROSTAT     ranolazine 500 MG 12 hr tablet  Commonly known as:  RANEXA     VITAMIN B 12 PO      TAKE these medications        acetaminophen 325 MG tablet  Commonly known as:  TYLENOL  Take 2 tablets (650 mg total) by mouth every 4 (four) hours as needed for headache or mild pain.     Albuterol Sulfate 108 (90 BASE)  MCG/ACT Aepb  Commonly known as:  PROAIR RESPICLICK  Inhale 1 puff into the lungs every 6 (six) hours as needed (shortness of breath).     aspirin EC 81 MG tablet  Take 1 tablet (81 mg total) by mouth daily.     BD PEN NEEDLE NANO U/F 32G X 4 MM Misc  Generic drug:  Insulin Pen Needle  USE WITH INSULIN ADMINISTRATION DAILY     chlorhexidine 0.12 % solution  Commonly known as:  PERIDEX  Use as directed 15 mLs in the mouth or throat 3 (three) times daily. May stop after discharged from SNF     diltiazem 240 MG 24 hr capsule  Commonly known as:  CARDIZEM CD  Take 1 capsule (240 mg total) by mouth daily.     feeding supplement (ENSURE ENLIVE) Liqd  Take 237 mLs by mouth 2 (two) times daily between meals.     ferrous sulfate 325 (65 FE) MG tablet  Take 1 tablet (325 mg total) by mouth daily with breakfast. For one month     folic acid 1 MG tablet  Commonly known as:  FOLVITE  Take 1 tablet (1 mg total) by mouth daily. For one month then stop.     glimepiride 4 MG tablet  Commonly known as:  AMARYL  Take 4 mg by mouth 2 (two) times daily.     insulin aspart 100 UNIT/ML injection  Commonly known as:  novoLOG  Inject 0-15 Units into the skin 3 (three) times daily with meals. U=uinits of Insulin CBG 70-120=0 U    CBG 251-300=8 U CBG 121-150=2 U  CBG 301-350=11 U CBG 151-200=3 U  CBG 351-400=15 U CBG 201-250=5 U  CBG>400, call MD     LANTUS SOLOSTAR 100 UNIT/ML Solostar Pen  Generic drug:  Insulin Glargine  Please use SS to administer Lantus (if needed). If glucose remains elevated and patient has better caloric intake, may gradually resume 20 units of Lantus  in am     metFORMIN 500 MG 24 hr tablet  Commonly known as:  GLUCOPHAGE-XR  Take 2 tablets (1,000 mg total) by mouth 2 (two) times daily.     metoprolol tartrate 25 MG tablet  Commonly known as:  LOPRESSOR  Take 1 tablet (25 mg total) by mouth 2 (two) times daily.     pravastatin 80 MG tablet  Commonly known as:   PRAVACHOL  Take 80 mg by mouth at bedtime.     TRUETRACK TEST test strip  Generic drug:  glucose blood  1 each by Other route 2 (two) times daily.     warfarin 2.5 MG tablet  Commonly known as:  COUMADIN  Take 1 tablet (2.5 mg total) by mouth daily at 6 PM. Or as directed          Follow Up: Follow-up Information    Follow up with Tereso Newcomer, PA-C On 11/26/2014.   Specialties:  Physician Assistant, Radiology, Interventional Cardiology   Why:  Cardiology appointment is with Tereso Newcomer, PA-C 10/13 @10 :10am ([address])    Contact information:   [address] [place] [address] [phone]       Follow up with [name], MD On 12/09/2014.   Specialty:  Cardiothoracic Surgery   Why:  Have a chest x-ray at [facility] at 8:30, then see MD at 9:30   Contact information:   [address] [place] [address] [phone]       Follow up with SNF.   Why:  Please have a PT/INR drawn on Thursday 11/12/2014 and have results faxed to Dr. Earmon Phoenix office     The patient has been discharged on:  1.Beta Blocker: Yes [ x ]  No [ ]   If No, reason:    2.Ace Inhibitor/ARB: Yes [ ]   No [ x ]  If No, reason: Postop elevated creatinine   3.Statin: Yes [ x ]  No [ ]   If No, reason:    4.Ecasa: Yes [ x]  No [ ]   If No, reason:    Naheim Burgen M PA-C 11/10/2014, 8:21 AM

## 2014-11-06 NOTE — Care Management Important Message (Signed)
Important Message  Patient Details  Name: FREDRICO BEEDLE MRN: [id number] Date of Birth: [birth year]   Medicare Important Message Given:  Yes-fourth notification given    Bernadette Hoit 11/06/2014, 10:35 AM

## 2014-11-06 NOTE — Progress Notes (Signed)
ANTICOAGULATION CONSULT NOTE - Follow Up Consult  Pharmacy Consult for heparin  Indication: atrial fibrillation  Labs:  Recent Labs  11/04/14 0310 11/04/14 0925 11/05/14 0620 11/06/14 0243 11/06/14 1258  HGB 8.8*  --   --  8.7*  --   HCT 26.1*  --   --  25.6*  --   PLT 150  --   --  157  --   LABPROT 16.0*  --  16.2* 18.2*  --   INR 1.27  --  1.29 1.50*  --   HEPARINUNFRC 0.59  --  0.38 0.22* 0.46  CREATININE  --  1.27* 1.16  --   --     Estimated Creatinine Clearance: 58.1 mL/min (by C-G formula based on Cr of 1.16).   Medications:  Infusions:  . heparin 1,500 Units/hr (11/06/14 1100)   Assessment: 81 y.o. male s/p CABG 9/15, h/o Afib on pradaxa PTA. Now on heparin bridging to coumadin. Heparin level subtherapeutic (0.4) this afternoon. Hgb low but stable. No issues with line or bleeding reported per RN.  Goal of Therapy:  Heparin level 0.3-0.7 units/ml Monitor platelets by anticoagulation protocol: Yes   Plan:   -Continue heparin infusion at 1500 units/hr -Daily HL/CBC  Sheppard Coil PharmD., BCPS Clinical Pharmacist Pager [phone] 11/06/2014 2:34 PM

## 2014-11-06 NOTE — Clinical Social Work Note (Signed)
CSW attempted to contact patient's son Frank Marks [phone] with bed offers.  Patient's son to talk with his siblings and make decision about which SNF he would like patient to go to and he will inform CSW.  CSW to continue to follow patient's progress, throughout discharge planning.  Ervin Knack. Kathryn Linarez, MSW, Theresia Majors [phone] 11/06/2014 4:09 PM

## 2014-11-06 NOTE — Clinical Social Work Note (Signed)
Clinical Social Work Assessment  Patient Details  Name: Frank Marks MRN: [id number] Date of Birth: [birth year]  Date of referral:  11/05/14               Reason for consult:  Facility Placement                Permission sought to share information with:  Family Supports Permission granted to share information::   (Patient has some confusion and his son Frank Marks is main contact)  Name::     SNF admissions   Agency::     Relationship::     Contact Information:     Housing/Transportation Living arrangements for the past 2 months:  Skilled Nursing Facility Source of Information:  Adult Children Patient Interpreter Needed:  None Criminal Activity/Legal Involvement Pertinent to Current Situation/Hospitalization:  No - Comment as needed Significant Relationships:  Adult Children Lives with:  Self Do you feel safe going back to the place where you live?  Yes (Patient would like some short term rehab first before he returns back home.) Need for family participation in patient care:  Yes (Comment) (Patient has some confusion)  Care giving concerns:  Patient has some weakness and confusion and needs to receive some therapy before he is ready to return back home.   Social Worker assessment / plan:  Patient is a 81 year old male who lives alone in his own house.  Patient has had some confusion since he has been at the hospital but he is now oriented to person and place.  Patient assessment was completed by speaking with patient's son, since patient has been confused.  Patient's son reported that patient is very active at home, and will need strengthening in order to return back home.  Patient's son expressed that he is concerned for his dad's safety and feels that a SNF would benefit patient for short term rehab.  Patient's son gave CSW permission to search for SNF placement.  Patient's son was explained SNF placement process and how insurance pays for the stay.  Patient's son did not have  any other questions and felt like he understood process.    Employment status:  Retired Health and safety inspector:  Medicare PT Recommendations:  Skilled Nursing Facility Information / Referral to community resources:     Patient/Family's Response to care:  Patient's family in agreeable to going to SNF for short term rehab.  Patient/Family's Understanding of and Emotional Response to Diagnosis, Current Treatment, and Prognosis: Patient still has some confusion but he is becoming more aware of the treatment plan and his current prognosis.  The family is aware of current treatment plan and prognosis.  Emotional Assessment Appearance:  Appears stated age Attitude/Demeanor/Rapport:    Affect (typically observed):  Appropriate, Calm, Pleasant Orientation:  Oriented to Place, Oriented to Self Alcohol / Substance use:  Not Applicable Psych involvement (Current and /or in the community):  No (Comment)  Discharge Needs  Concerns to be addressed:  Lack of Support Readmission within the last 30 days:  No Current discharge risk:  Lack of support system, Lives alone Barriers to Discharge:  No Barriers Identified   Darleene Cleaver, LCSWA 11/06/2014, 2:03 PM

## 2014-11-07 LAB — PROTIME-INR
INR: 1.9 — ABNORMAL HIGH (ref 0.00–1.49)
PROTHROMBIN TIME: 21.7 s — AB (ref 11.6–15.2)

## 2014-11-07 LAB — BASIC METABOLIC PANEL
ANION GAP: 13 (ref 5–15)
BUN: 20 mg/dL (ref 6–20)
CHLORIDE: 100 mmol/L — AB (ref 101–111)
CO2: 20 mmol/L — ABNORMAL LOW (ref 22–32)
Calcium: 8.1 mg/dL — ABNORMAL LOW (ref 8.9–10.3)
Creatinine, Ser: 1.25 mg/dL — ABNORMAL HIGH (ref 0.61–1.24)
GFR calc Af Amer: 59 mL/min — ABNORMAL LOW (ref >60)
GFR, EST NON AFRICAN AMERICAN: 51 mL/min — AB (ref >60)
Glucose, Bld: 193 mg/dL — ABNORMAL HIGH (ref 65–99)
POTASSIUM: 4.1 mmol/L (ref 3.5–5.1)
SODIUM: 133 mmol/L — AB (ref 135–145)

## 2014-11-07 LAB — URINALYSIS W MICROSCOPIC (NOT AT ARMC)
BILIRUBIN URINE: NEGATIVE
GLUCOSE, UA: NEGATIVE mg/dL
KETONES UR: NEGATIVE mg/dL
NITRITE: POSITIVE — AB
PH: 5 (ref 5.0–8.0)
PROTEIN: 30 mg/dL — AB
Specific Gravity, Urine: 1.008 (ref 1.005–1.030)
Urobilinogen, UA: 1 mg/dL (ref 0.0–1.0)

## 2014-11-07 LAB — CBC
HCT: 24.9 % — ABNORMAL LOW (ref 39.0–52.0)
HEMOGLOBIN: 8.3 g/dL — AB (ref 13.0–17.0)
MCH: 29.1 pg (ref 26.0–34.0)
MCHC: 33.3 g/dL (ref 30.0–36.0)
MCV: 87.4 fL (ref 78.0–100.0)
Platelets: 148 10*3/uL — ABNORMAL LOW (ref 150–400)
RBC: 2.85 MIL/uL — AB (ref 4.22–5.81)
RDW: 15.1 % (ref 11.5–15.5)
WBC: 13.8 10*3/uL — AB (ref 4.0–10.5)

## 2014-11-07 LAB — GLUCOSE, CAPILLARY
GLUCOSE-CAPILLARY: 128 mg/dL — AB (ref 65–99)
Glucose-Capillary: 155 mg/dL — ABNORMAL HIGH (ref 65–99)
Glucose-Capillary: 155 mg/dL — ABNORMAL HIGH (ref 65–99)
Glucose-Capillary: 158 mg/dL — ABNORMAL HIGH (ref 65–99)

## 2014-11-07 LAB — HEPARIN LEVEL (UNFRACTIONATED): Heparin Unfractionated: 0.25 IU/mL — ABNORMAL LOW (ref 0.30–0.70)

## 2014-11-07 MED ORDER — WARFARIN SODIUM 5 MG PO TABS
5.0000 mg | ORAL_TABLET | Freq: Every day | ORAL | Status: DC
  Administered 2014-11-07: 5 mg via ORAL
  Filled 2014-11-07: qty 1

## 2014-11-07 MED ORDER — AMLODIPINE BESYLATE 5 MG PO TABS
5.0000 mg | ORAL_TABLET | Freq: Every day | ORAL | Status: DC
  Administered 2014-11-07 – 2014-11-09 (×3): 5 mg via ORAL
  Filled 2014-11-07 (×3): qty 1

## 2014-11-07 MED ORDER — FERROUS SULFATE 325 (65 FE) MG PO TABS
325.0000 mg | ORAL_TABLET | Freq: Every day | ORAL | Status: DC
  Administered 2014-11-07 – 2014-11-10 (×4): 325 mg via ORAL
  Filled 2014-11-07 (×4): qty 1

## 2014-11-07 MED ORDER — SODIUM CHLORIDE 0.9 % IJ SOLN
10.0000 mL | INTRAMUSCULAR | Status: DC | PRN
Start: 2014-11-07 — End: 2014-11-10
  Administered 2014-11-07 (×2): 20 mL
  Administered 2014-11-08: 10 mL
  Administered 2014-11-08: 30 mL
  Administered 2014-11-09 (×2): 10 mL
  Administered 2014-11-09: 20 mL
  Administered 2014-11-10: 10 mL
  Filled 2014-11-07 (×7): qty 40

## 2014-11-07 MED ORDER — FOLIC ACID 1 MG PO TABS
1.0000 mg | ORAL_TABLET | Freq: Every day | ORAL | Status: DC
  Administered 2014-11-07 – 2014-11-10 (×4): 1 mg via ORAL
  Filled 2014-11-07 (×4): qty 1

## 2014-11-07 NOTE — Progress Notes (Signed)
11/07/2014 2:10 PM EPW d/c per order and per protocol. Ends intact. Pt. Tolerated well. Advised BR x 1 HR. Call bell within reach. VS collected per protocol. Will continue to monitor patient.  Flippin, Blanchard Kelch

## 2014-11-07 NOTE — Progress Notes (Signed)
CSW Proofreader) spoke with pt son. He has not made a decision on which facility pt will dc to. He had no questions. Agreeable to calling weekday CSW Monday morning with decision.  Katelyne Galster [facility] Weekend CSW [phone]

## 2014-11-07 NOTE — Progress Notes (Signed)
CARDIAC REHAB PHASE I   PRE:  Rate/Rhythm: 85 afib    BP: sitting 139/87    SaO2: 96 RA  MODE:  Ambulation: 32 ft   POST:  Rate/Rhythm: 141 afib    BP: sitting 134/93     SaO2: 97 RA  Pt willing to walk. Needed mod assist x2 with gait belt to stand.  To [facility] for BM then had to sit x1 more before leaving room. Used RW. Pt HR increased with distance, more SOB. He wanted to sit in hall but was able to turn around and make it back to straight back chair inside room. Max HR 141, SaO2 stable. Pt rested then moved to recliner. Pt pleasant but thought he was at his house. Will f/u Monday.  [phone]  Elissa [place] CES, ACSM 11/07/2014 3:39 PM

## 2014-11-07 NOTE — Progress Notes (Addendum)
      [address]       [place] [address]             [phone]        9 Days Post-Op Procedure(s) (LRB): EXPLORATION POST OPERATIVE OPEN HEART (N/A)  Subjective: Pleasantly confused  Objective: Vital signs in last 24 hours: Temp:  [97.6 F (36.4 C)-99 F (37.2 C)] 98.5 F (36.9 C) (09/24 0519) Pulse Rate:  [68-99] 99 (09/24 0600) Cardiac Rhythm:  [-] Atrial fibrillation (09/24 0054) Resp:  [18-26] 18 (09/24 0519) BP: (122-174)/(72-97) 146/93 mmHg (09/24 0600) SpO2:  [95 %-100 %] 95 % (09/24 0519) Weight:  [222 lb 14.2 oz (101.1 kg)] 222 lb 14.2 oz (101.1 kg) (09/24 0519)  Pre op weight kg 94 kg Current Weight  11/07/14 222 lb 14.2 oz (101.1 kg)      Intake/Output from previous day: 09/23 0701 - 09/24 0700 In: 90 [I.V.:90] Out: 2050 [Urine:2050]   Physical Exam:  Cardiovascular: IRRR IRRR Pulmonary: Clear to auscultation bilaterally; no rales, wheezes, or rhonchi. Abdomen: Soft, non tender, bowel sounds present. Extremities: Mild bilateral lower extremity edema. Wounds: Clean and dry.  No erythema or signs of infection.  Lab Results: CBC: Recent Labs  11/06/14 0243 11/07/14 0332  WBC 17.3* 13.8*  HGB 8.7* 8.3*  HCT 25.6* 24.9*  PLT 157 148*   BMET:  Recent Labs  11/05/14 0620 11/07/14 0332  NA 133* 133*  K 3.6 4.1  CL 102 100*  CO2 22 20*  GLUCOSE 152* 193*  BUN 20 20  CREATININE 1.16 1.25*  CALCIUM 8.3* 8.1*    PT/INR:  Lab Results  Component Value Date   INR 1.90* 11/07/2014   INR 1.50* 11/06/2014   INR 1.29 11/05/2014   ABG:  INR: Will add last result for INR, ABG once components are confirmed Will add last 4 CBG results once components are confirmed  Assessment/Plan:  1. CV - Chronic a fib with CVR and hypertensive.On Lopressor 25 mg bid, Heparin drip, and Coumadin. INR increased from 1.5 to 1.9. Will stop Heparin drip. Will give Couamdin 5 instead of 7.5 tonight and will start Norvasc for increased  SBP. 2.  Pulmonary - Encourage incentive spirometer 3. Volume Overload - On Lasix 40 mg daily 4.  Acute blood loss anemia - H and H stable at 8.3 and 24.9. Start ferrous. 5. AKI-creatinine slightly increased from 1.16 to 1.25. He is not on ACE but is on Lasix and Metformin. Will check in am and if continues to increase will stop Metformin. 6. Post op delirium-NOT on narcotics. At significant others request, check UA and vitamin B 12 level 7.DM-CBGs 120/142/155. On Metformin 1000 mg bid and Amaryl 4 mg bid. 8. Mild thrombocytopenia-platelets 148,000 9. Deconditioning-continue with CRPI 10. Will likely remove EPW later this am-2 hours after Heparin stopped  ZIMMERMAN,DONIELLE MPA-C 11/07/2014,7:33 AM  White count improving, down to 13,000--continue to follow IV heparin stopped, INR is 1.9 patient examined and medical record reviewed,agree with above note. Kathlee Nations Trigt III 11/07/2014

## 2014-11-07 NOTE — Progress Notes (Signed)
ANTICOAGULATION CONSULT NOTE - Follow Up Consult  Pharmacy Consult for heparin  Indication: atrial fibrillation  Labs:  Recent Labs  11/04/14 0925  11/05/14 0620 11/06/14 0243 11/06/14 1258 11/07/14 0332  HGB  --   --   --  8.7*  --  8.3*  HCT  --   --   --  25.6*  --  24.9*  PLT  --   --   --  157  --  148*  LABPROT  --   --  16.2* 18.2*  --  21.7*  INR  --   --  1.29 1.50*  --  1.90*  HEPARINUNFRC  --   < > 0.38 0.22* 0.46 0.25*  CREATININE 1.27*  --  1.16  --   --  1.25*  < > = values in this interval not displayed.  Estimated Creatinine Clearance: 54 mL/min (by C-G formula based on Cr of 1.25).   Medications:  Infusions:  . heparin 1,500 Units/hr (11/07/14 0047)   Assessment: 81 y.o. male s/p CABG 9/15, h/o Afib on pradaxa PTA. Now on heparin bridging to coumadin. Heparin level subtherapeutic (0.25). Hgb low but stable. No issues with line or bleeding reported per RN. INR up to 1.9 - moving nicely on 7.5mg  daily - dosing per MD.  Goal of Therapy:  Heparin level 0.3-0.7 units/ml Monitor platelets by anticoagulation protocol: Yes   Plan:   -Increase heparin infusion to 1650 units/hr -Will f/u 6 hr heparin level  Christoper Fabian, PharmD, BCPS Clinical pharmacist, pager [phone] 11/07/2014 4:51 AM

## 2014-11-07 NOTE — Progress Notes (Signed)
UR COMPLETED  

## 2014-11-08 LAB — CBC
HEMATOCRIT: 25.4 % — AB (ref 39.0–52.0)
HEMOGLOBIN: 8.4 g/dL — AB (ref 13.0–17.0)
MCH: 28.7 pg (ref 26.0–34.0)
MCHC: 33.1 g/dL (ref 30.0–36.0)
MCV: 86.7 fL (ref 78.0–100.0)
Platelets: 157 10*3/uL (ref 150–400)
RBC: 2.93 MIL/uL — ABNORMAL LOW (ref 4.22–5.81)
RDW: 14.9 % (ref 11.5–15.5)
WBC: 10.5 10*3/uL (ref 4.0–10.5)

## 2014-11-08 LAB — BASIC METABOLIC PANEL
ANION GAP: 9 (ref 5–15)
BUN: 17 mg/dL (ref 6–20)
CALCIUM: 8.7 mg/dL — AB (ref 8.9–10.3)
CO2: 24 mmol/L (ref 22–32)
Chloride: 101 mmol/L (ref 101–111)
Creatinine, Ser: 1.19 mg/dL (ref 0.61–1.24)
GFR calc Af Amer: >60 mL/min (ref >60)
GFR calc non Af Amer: 54 mL/min — ABNORMAL LOW (ref >60)
GLUCOSE: 188 mg/dL — AB (ref 65–99)
Potassium: 3.7 mmol/L (ref 3.5–5.1)
Sodium: 134 mmol/L — ABNORMAL LOW (ref 135–145)

## 2014-11-08 LAB — GLUCOSE, CAPILLARY
GLUCOSE-CAPILLARY: 125 mg/dL — AB (ref 65–99)
Glucose-Capillary: 171 mg/dL — ABNORMAL HIGH (ref 65–99)
Glucose-Capillary: 142 mg/dL — ABNORMAL HIGH (ref 65–99)
Glucose-Capillary: 103 mg/dL — ABNORMAL HIGH (ref 65–99)

## 2014-11-08 LAB — PROTIME-INR
INR: 2.43 — ABNORMAL HIGH (ref 0.00–1.49)
PROTHROMBIN TIME: 26.1 s — AB (ref 11.6–15.2)

## 2014-11-08 LAB — VITAMIN B12: Vitamin B-12: 3362 pg/mL — ABNORMAL HIGH (ref 180–914)

## 2014-11-08 MED ORDER — METOPROLOL TARTRATE 25 MG PO TABS
37.5000 mg | ORAL_TABLET | Freq: Two times a day (BID) | ORAL | Status: DC
  Administered 2014-11-08 – 2014-11-09 (×3): 37.5 mg via ORAL
  Filled 2014-11-08 (×6): qty 1

## 2014-11-08 MED ORDER — POTASSIUM CHLORIDE CRYS ER 10 MEQ PO TBCR
30.0000 meq | EXTENDED_RELEASE_TABLET | Freq: Once | ORAL | Status: AC
  Administered 2014-11-08: 30 meq via ORAL
  Filled 2014-11-08 (×2): qty 1

## 2014-11-08 MED ORDER — ALTEPLASE 2 MG IJ SOLR
2.0000 mg | Freq: Once | INTRAMUSCULAR | Status: AC
  Administered 2014-11-08: 2 mg
  Filled 2014-11-08: qty 2

## 2014-11-08 MED ORDER — CHLORHEXIDINE GLUCONATE 0.12 % MT SOLN
15.0000 mL | Freq: Three times a day (TID) | OROMUCOSAL | Status: DC
  Administered 2014-11-08 – 2014-11-10 (×5): 15 mL via OROMUCOSAL
  Filled 2014-11-08 (×6): qty 15

## 2014-11-08 NOTE — Progress Notes (Signed)
11/08/2014 0930 Nursing note Pt. Refusing ambulation at this time. Education provided to patient. Pt. States he would just like to go to bed at this time. States he did not rest well last night. Encouraged further ambulation attempts this evening.  Flippin, Blanchard Kelch

## 2014-11-08 NOTE — Discharge Instructions (Addendum)
We ask the SNF to please do the following: 1. Please obtain vital signs at least one time daily 2.Please weigh the patient daily. If he or she continues to gain weight or develops lower extremity edema, contact the office at [phone]. 3. Ambulate patient at least three times daily and please use sternal precautions.    Information on my medicine - Coumadin   (Warfarin)  Why was Coumadin prescribed for you? Coumadin was prescribed for you because you have a blood clot or a medical condition that can cause an increased risk of forming blood clots. Blood clots can cause serious health problems by blocking the flow of blood to the heart, lung, or brain. Coumadin can prevent harmful blood clots from forming. As a reminder your indication for Coumadin is:   Stroke Prevention Because Of Atrial Fibrillation  What test will check on my response to Coumadin? While on Coumadin (warfarin) you will need to have an INR test regularly to ensure that your dose is keeping you in the desired range. The INR (international normalized ratio) number is calculated from the result of the laboratory test called prothrombin time (PT).  If an INR APPOINTMENT HAS NOT ALREADY BEEN MADE FOR YOU please schedule an appointment to have this lab work done by your health care provider within 7 days. Your INR goal is usually a number between:  2 to 3 or your provider may give you a more narrow range like 2-2.5.  Ask your health care provider during an office visit what your goal INR is.  What  do you need to  know  About  COUMADIN? Take Coumadin (warfarin) exactly as prescribed by your healthcare provider about the same time each day.  DO NOT stop taking without talking to the doctor who prescribed the medication.  Stopping without other blood clot prevention medication to take the place of Coumadin may increase your risk of developing a new clot or stroke.  Get refills before you run out.  What do you do if you miss a  dose? If you miss a dose, take it as soon as you remember on the same day then continue your regularly scheduled regimen the next day.  Do not take two doses of Coumadin at the same time.  Important Safety Information A possible side effect of Coumadin (Warfarin) is an increased risk of bleeding. You should call your healthcare provider right away if you experience any of the following: ? Bleeding from an injury or your nose that does not stop. ? Unusual colored urine (red or dark brown) or unusual colored stools (red or black). ? Unusual bruising for unknown reasons. ? A serious fall or if you hit your head (even if there is no bleeding).  Some foods or medicines interact with Coumadin (warfarin) and might alter your response to warfarin. To help avoid this: ? Eat a balanced diet, maintaining a consistent amount of Vitamin K. ? Notify your provider about major diet changes you plan to make. ? Avoid alcohol or limit your intake to 1 drink for women and 2 drinks for men per day. (1 drink is 5 oz. wine, 12 oz. beer, or 1.5 oz. liquor.)  Make sure that ANY health care provider who prescribes medication for you knows that you are taking Coumadin (warfarin).  Also make sure the healthcare provider who is monitoring your Coumadin knows when you have started a new medication including herbals and non-prescription products.  Coumadin (Warfarin)  Major Drug Interactions  Increased  Warfarin Effect Decreased Warfarin Effect  Alcohol (large quantities) Antibiotics (esp. Septra/Bactrim, Flagyl, Cipro) Amiodarone (Cordarone) Aspirin (ASA) Cimetidine (Tagamet) Megestrol (Megace) NSAIDs (ibuprofen, naproxen, etc.) Piroxicam (Feldene) Propafenone (Rythmol SR) Propranolol (Inderal) Isoniazid (INH) Posaconazole (Noxafil) Barbiturates (Phenobarbital) Carbamazepine (Tegretol) Chlordiazepoxide (Librium) Cholestyramine (Questran) Griseofulvin Oral Contraceptives Rifampin Sucralfate  (Carafate) Vitamin K   Coumadin (Warfarin) Major Herbal Interactions  Increased Warfarin Effect Decreased Warfarin Effect  Garlic Ginseng Ginkgo biloba Coenzyme Q10 Green tea St. Johns wort    Coumadin (Warfarin) FOOD Interactions  Eat a consistent number of servings per week of foods HIGH in Vitamin K (1 serving =  cup)  Collards (cooked, or boiled & drained) Kale (cooked, or boiled & drained) Mustard greens (cooked, or boiled & drained) Parsley *serving size only =  cup Spinach (cooked, or boiled & drained) Swiss chard (cooked, or boiled & drained) Turnip greens (cooked, or boiled & drained)  Eat a consistent number of servings per week of foods MEDIUM-HIGH in Vitamin K (1 serving = 1 cup)  Asparagus (cooked, or boiled & drained) Broccoli (cooked, boiled & drained, or raw & chopped) Brussel sprouts (cooked, or boiled & drained) *serving size only =  cup Lettuce, raw (green leaf, endive, romaine) Spinach, raw Turnip greens, raw & chopped   These websites have more information on Coumadin (warfarin):  http://www.king-russell.com/; https://www.hines.net/;  Coronary Artery Bypass Grafting, Care After Refer to this sheet in the next few weeks. These instructions provide you with information on caring for yourself after your procedure. Your health care provider may also give you more specific instructions. Your treatment has been planned according to current medical practices, but problems sometimes occur. Call your health care provider if you have any problems or questions after your procedure. WHAT TO EXPECT AFTER THE PROCEDURE Recovery from surgery will be different for everyone. Some people feel well after 3 or 4 weeks, while for others it takes longer. After your procedure, it is typical to have the following:  Nausea and a lack of appetite.   Constipation.  Weakness and fatigue.   Depression or irritability.   Pain or discomfort at your incision site. HOME  CARE INSTRUCTIONS  Take medicines only as directed by your health care provider. Do not stop taking medicines or start any new medicines without first checking with your health care provider.  Take your pulse as directed by your health care provider.  Perform deep breathing as directed by your health care provider. If you were given a device called an incentive spirometer, use it to practice deep breathing several times a day. Support your chest with a pillow or your arms when you take deep breaths or cough.  Keep incision areas clean, dry, and protected. Remove or change any bandages (dressings) only as directed by your health care provider. You may have skin adhesive strips over the incision areas. Do not take the strips off. They will fall off on their own.  Check incision areas daily for any swelling, redness, or drainage.  If incisions were made in your legs, do the following:  Avoid crossing your legs.   Avoid sitting for long periods of time. Change positions every 30 minutes.   Elevate your legs when you are sitting.  Wear compression stockings as directed by your health care provider. These stockings help keep blood clots from forming in your legs.  Take showers once your health care provider approves. Until then, only take sponge baths. Pat incisions dry. Do not rub incisions with a washcloth or towel.  Do not take baths, swim, or use a hot tub until your health care provider approves.  Eat foods that are high in fiber, such as raw fruits and vegetables, whole grains, beans, and nuts. Meats should be lean cut. Avoid canned, processed, and fried foods.  Drink enough fluid to keep your urine clear or pale yellow.  Weigh yourself every day. This helps identify if you are retaining fluid that may make your heart and lungs work harder.  Rest and limit activity as directed by your health care provider. You may be instructed to:  Stop any activity at once if you have chest pain,  shortness of breath, irregular heartbeats, or dizziness. Get help right away if you have any of these symptoms.  Move around frequently for short periods or take short walks as directed by your health care provider. Increase your activities gradually. You may need physical therapy or cardiac rehabilitation to help strengthen your muscles and build your endurance.  Avoid lifting, pushing, or pulling anything heavier than 10 lb (4.5 kg) for at least 6 weeks after surgery.  Do not drive until your health care provider approves.  Ask your health care provider when you may return to work.  Ask your health care provider when you may resume sexual activity.  Keep all follow-up visits as directed by your health care provider. This is important. SEEK MEDICAL CARE IF:  You have swelling, redness, increasing pain, or drainage at the site of an incision.  You have a fever.  You have swelling in your ankles or legs.  You have pain in your legs.   You gain 2 or more pounds (0.9 kg) a day.  You are nauseous or vomit.  You have diarrhea. SEEK IMMEDIATE MEDICAL CARE IF:  You have chest pain that goes to your jaw or arms.  You have shortness of breath.   You have a fast or irregular heartbeat.   You notice a "clicking" in your breastbone (sternum) when you move.   You have numbness or weakness in your arms or legs.  You feel dizzy or light-headed.  MAKE SURE YOU:  Understand these instructions.  Will watch your condition.  Will get help right away if you are not doing well or get worse. Document Released: 08/19/2004 Document Revised: 06/16/2013 Document Reviewed: 07/09/2012 [facility] Patient Information 2015 [place], [place]. This information is not intended to replace advice given to you by your health care provider. Make sure you discuss any questions you have with your health care provider.

## 2014-11-08 NOTE — Progress Notes (Addendum)
      [address]       [place] [address]             [phone]        10 Days Post-Op Procedure(s) (LRB): EXPLORATION POST OPERATIVE OPEN HEART (N/A)  Subjective: Pleasantly confused  Objective: Vital signs in last 24 hours: Temp:  [97.3 F (36.3 C)-98.1 F (36.7 C)] 97.3 F (36.3 C) (09/25 0509) Pulse Rate:  [73-92] 92 (09/24 2035) Cardiac Rhythm:  [-] Atrial fibrillation (09/25 0700) Resp:  [18-20] 20 (09/25 0509) BP: (134-174)/(72-95) 141/95 mmHg (09/25 0509) SpO2:  [96 %-99 %] 99 % (09/25 0509) Weight:  [220 lb 0.3 oz (99.8 kg)] 220 lb 0.3 oz (99.8 kg) (09/25 0500)  Pre op weight kg 94 kg Current Weight  11/08/14 220 lb 0.3 oz (99.8 kg)      Intake/Output from previous day: 09/24 0701 - 09/25 0700 In: -  Out: 2375 [Urine:2375]   Physical Exam:  Cardiovascular: Crecencio Mc Pulmonary: Clear to auscultation bilaterally; no rales, wheezes, or rhonchi. Abdomen: Soft, non tender, bowel sounds present. Extremities: Mild bilateral lower extremity edema. Ecchymosis inner right thigh. Wounds: Clean and dry.  No erythema or signs of infection.  Lab Results: CBC:  Recent Labs  11/07/14 0332 11/08/14 0529  WBC 13.8* 10.5  HGB 8.3* 8.4*  HCT 24.9* 25.4*  PLT 148* 157   BMET:   Recent Labs  11/07/14 0332 11/08/14 0529  NA 133* 134*  K 4.1 3.7  CL 100* 101  CO2 20* 24  GLUCOSE 193* 188*  BUN 20 17  CREATININE 1.25* 1.19  CALCIUM 8.1* 8.7*    PT/INR:  Lab Results  Component Value Date   INR 2.43* 11/08/2014   INR 1.90* 11/07/2014   INR 1.50* 11/06/2014   ABG:  INR: Will add last result for INR, ABG once components are confirmed Will add last 4 CBG results once components are confirmed  Assessment/Plan:  1. CV - Chronic a fib with HR into the 100's and hypertensive.On Lopressor 25 mg bid, Norvasc 5 mg daily, and Coumadin. INR increased from 1.9 to 2.43. Will hold Coumadin tonight and increase Lopressor to 37.5 mg bid for  better HR control 2.  Pulmonary - Encourage incentive spirometer 3. Volume Overload - On Lasix 40 mg daily 4.  Acute blood loss anemia - H and H stable at 8.4 and 25.4. Start ferrous. 5. AKI-creatinine slightly decreased from 1.25 to 1.19.  6. Post op delirium-NOT on narcotics.  7.DM-CBGs 128/155/158. On Metformin 1000 mg bid and Amaryl 4 mg bid. 8. Mild thrombocytopenia resolved-platelets up to 157,000 9. Supplement potassium  ZIMMERMAN,DONIELLE MPA-C 11/08/2014,7:38 AM   Remains weak, requires full assist for ambulation short distance By mouth intake is adequate Incisions are healing, white count normal INR jumped to 2.45-will hold  dose of Coumadin tonight Definitely needs skilled nursing facility at discharge

## 2014-11-09 LAB — GLUCOSE, CAPILLARY
Glucose-Capillary: 114 mg/dL — ABNORMAL HIGH (ref 65–99)
Glucose-Capillary: 124 mg/dL — ABNORMAL HIGH (ref 65–99)
Glucose-Capillary: 120 mg/dL — ABNORMAL HIGH (ref 65–99)
Glucose-Capillary: 163 mg/dL — ABNORMAL HIGH (ref 65–99)

## 2014-11-09 LAB — PROTIME-INR
INR: 2.17 — ABNORMAL HIGH (ref 0.00–1.49)
PROTHROMBIN TIME: 24 s — AB (ref 11.6–15.2)

## 2014-11-09 MED ORDER — METOPROLOL TARTRATE 25 MG PO TABS
25.0000 mg | ORAL_TABLET | Freq: Two times a day (BID) | ORAL | Status: DC
  Administered 2014-11-09 – 2014-11-10 (×2): 25 mg via ORAL
  Filled 2014-11-09 (×2): qty 1

## 2014-11-09 MED ORDER — DILTIAZEM HCL ER COATED BEADS 240 MG PO CP24
240.0000 mg | ORAL_CAPSULE | Freq: Every day | ORAL | Status: DC
  Administered 2014-11-09 – 2014-11-10 (×2): 240 mg via ORAL
  Filled 2014-11-09 (×2): qty 1

## 2014-11-09 MED ORDER — WARFARIN SODIUM 2.5 MG PO TABS
2.5000 mg | ORAL_TABLET | Freq: Every day | ORAL | Status: DC
  Administered 2014-11-09: 2.5 mg via ORAL
  Filled 2014-11-09: qty 1

## 2014-11-09 NOTE — Progress Notes (Addendum)
      [address]       [place] [address]             [phone]        11 Days Post-Op Procedure(s) (LRB): EXPLORATION POST OPERATIVE OPEN HEART (N/A)  Subjective: Pleasantly confused  Objective: Vital signs in last 24 hours: Temp:  [98.5 F (36.9 C)-98.9 F (37.2 C)] 98.8 F (37.1 C) (09/26 0530) Pulse Rate:  [83-97] 97 (09/25 1952) Cardiac Rhythm:  [-] Atrial fibrillation (09/25 1950) Resp:  [18-20] 20 (09/26 0530) BP: (124-170)/(77-82) 138/78 mmHg (09/26 0530) SpO2:  [96 %-98 %] 98 % (09/26 0530) Weight:  [211 lb 9.6 oz (95.981 kg)] 211 lb 9.6 oz (95.981 kg) (09/26 0452)  Pre op weight kg 94 kg Current Weight  11/09/14 211 lb 9.6 oz (95.981 kg)      Intake/Output from previous day: 09/25 0701 - 09/26 0700 In: 480 [P.O.:480] Out: 1000 [Urine:1000]   Physical Exam:  Cardiovascular: IRRR IRRR Pulmonary: Clear to auscultation bilaterally; no rales, wheezes, or rhonchi. Abdomen: Soft, non tender, bowel sounds present. Extremities: Mild bilateral lower extremity edema. Ecchymosis inner right thigh. Wounds: Clean and dry.  No erythema or signs of infection.  Lab Results: CBC:  Recent Labs  11/07/14 0332 11/08/14 0529  WBC 13.8* 10.5  HGB 8.3* 8.4*  HCT 24.9* 25.4*  PLT 148* 157   BMET:   Recent Labs  11/07/14 0332 11/08/14 0529  NA 133* 134*  K 4.1 3.7  CL 100* 101  CO2 20* 24  GLUCOSE 193* 188*  BUN 20 17  CREATININE 1.25* 1.19  CALCIUM 8.1* 8.7*    PT/INR:  Lab Results  Component Value Date   INR 2.17* 11/09/2014   INR 2.43* 11/08/2014   INR 1.90* 11/07/2014   ABG:  INR: Will add last result for INR, ABG once components are confirmed Will add last 4 CBG results once components are confirmed  Assessment/Plan:  1. CV - Chronic a fib with HR into the 90's.On Lopressor 37.5 mg bid, Norvasc 5 mg daily, and Coumadin. INR decreased from 2.43 to 2.17. Coumadin was held last evening. Will restart low dose. 2.   Pulmonary - On room air. Encourage incentive spirometer 3. Volume Overload - On Lasix 40 mg daily 4.  Acute blood loss anemia - H and H stable at 8.4 and 25.4. Start ferrous. 5. AKI-creatinine slightly decreased to 1.19 6. Post op delirium-NOT on narcotics.  7.DM-CBGs 142/103/114. On Metformin 1000 mg bid and Amaryl 4 mg bid. 8. Mild thrombocytopenia resolved-platelets up to 157,000 9. Vitamin B12 result is 3362 which is high. Will instruct significant other no more vitamin B12 shots   ZIMMERMAN,DONIELLE MPA-C 11/09/2014,7:41 AM    Chart reviewed, patient examined, agree with above. He is stable overall. He is not walking very far and only made it outside door this am. His heart rate went up into the 140's. Will resume his previous cardizem and lopressor doses to try to achieve better rate control. He is therapeutic on coumadin. Goal INR should be 2-2.5. He will need to be on coumadin for 3 months and can then decide about going back on Pradaxa for a-fib. I think he can go to SNF tomorrow to continue physical therapy.

## 2014-11-09 NOTE — Clinical Social Work Note (Addendum)
CSW spoke with patient's son [name], to discuss SNF placement.  Patient's son is reconsidering his choice of [facility] and is touring other facilities to see which one he likes better.  Patient's son will contact CSW with final decision for SNF today.    4:15pm CSW received phone call from patient's son stating he decided on [facility].  [facility] contacted [facility] to confirm they can take patient, and they can take patient tomorrow once he is medically ready for discharge and orders have been received.  CSW to continue following patient's progress.    Ervin Knack. Anterhaus, MSW, [facility] [phone] 11/09/2014 12:54 PM

## 2014-11-09 NOTE — Care Management Important Message (Signed)
Important Message  Patient Details  Name: Frank Marks MRN: [id number] Date of Birth: [birth year]   Medicare Important Message Given:  Yes-fourth notification given    Orson Aloe 11/09/2014, 2:11 PM

## 2014-11-09 NOTE — Progress Notes (Signed)
CARDIAC REHAB PHASE I   PRE:  Rate/Rhythm: 88 afib  BP:  Supine:   Sitting: 155/98  Standing:    SaO2: 98%RA  MODE:  Ambulation: just outside of door ft   POST:  Rate/Rhythm: up to 140"s afib when up and to 100's with reat  BP:  Supine:   Sitting: 150/83  Standing:    SaO2: 96%RA 1023-1105 Pt very deconditioned. He walked with asst x 2 and gait belt use and rolling walker to sink and had to sit in chair. Heart rate to 140's with that short distance. Then he walked just outside of door and back to recliner with heart rate up again. He stated he was really tired and weak. Had to get pt to rock to stand and assist him. Encouraged pt to stay close to walker but he had tendency to get it out too far. Chair alarm on and call bell in reach.   Luetta Nutting, RN BSN  11/09/2014 11:02 AM   88 afib

## 2014-11-09 NOTE — Clinical Social Work Note (Signed)
CSW received phone call from patient's son [name], he has decided on [facility] as the SNF for patient to go to.  CSW contacted [facility] to get confirmation of a bed offer, left message on voice mail watiing for call back.  CSW to continue to follow patient's progress.  Ervin Knack. Anterhaus, MSW, LCSWA [phone] 11/09/2014 10:09 AM

## 2014-11-09 NOTE — Progress Notes (Signed)
Physical Therapy Treatment Patient Details Name: SAMMUEL BLICK MRN: [id number] DOB: [birth year] Today's Date: 11/09/2014    History of Present Illness 81 year old gentleman with a history of hypertension, hypercholesterolemia, acute on chronic diastolic heart failure, chronic atrial fibrillation, and chronic diastolic heart failure with progressive shortness of breath, exertional fatigue, and substernal chest discomfort. Admitted for CABGx 4 and AVR performed 9/15 with return to OR 9/16 for exploration secondary to mediastinal bleeding. Post op course complicated by AMS    PT Comments    Patient stated he just could not get up OOB due to not feeling well. RN arrived and confirmed pt has had a very rough day (lack of sleep last night, vomited in a.m., fatigued quickly when trying to walk). Patient did agree to work on strengthening exercises for his legs (continues to need assist with sit to stand due to sternal precautions and cannot use arms to assist). Will attempt to see 9/27 for further mobility training.   Follow Up Recommendations  SNF;Supervision/Assistance - 24 hour     Equipment Recommendations  Other (comment) (TBD)    Recommendations for Other Services       Precautions / Restrictions Precautions Precautions: Sternal;Fall Precaution Comments: very HOH Restrictions Weight Bearing Restrictions:  (sternal precautions)    Mobility  Bed Mobility Overal bed mobility: Needs Assistance Bed Mobility: Rolling Rolling: Min assist (with rail)         General bed mobility comments: cues for sequence with assist to maintain sternal precautions.   Transfers                 General transfer comment: pt refused OOB due to "rough day" and not feeling well (RN confirmed)  Ambulation/Gait                 Stairs            Wheelchair Mobility    Modified Rankin (Stroke Patients Only)       Balance                                     Cognition Arousal/Alertness: Awake/alert Behavior During Therapy: Flat affect Overall Cognitive Status: No family/caregiver present to determine baseline cognitive functioning                      Exercises Low Level/ICU Exercises Ankle Circles/Pumps: AROM;Both;10 reps (followed by bil heel cord stretch) Short Arc Quad: AROM;Both;10 reps Heel Slides: AROM;Both;Other reps (comment) (3 reps and reported pinching by catheter/further deferred) Stabilized Bridging: AROM;Both;5 reps    General Comments        Pertinent Vitals/Pain Pain Assessment: No/denies pain    Home Living                      Prior Function            PT Goals (current goals can now be found in the care plan section) Acute Rehab PT Goals Patient Stated Goal: go hunting Time For Goal Achievement: 11/16/14 Progress towards PT goals: Not progressing toward goals - comment (vomited this a.m.; did not do well with Cardiac Rehab)    Frequency  Min 3X/week    PT Plan Current plan remains appropriate    Co-evaluation             End of Session   Activity Tolerance: Patient limited by fatigue  Patient left: with call bell/phone within reach;in bed;with chair alarm set;with SCD's reapplied     Time: 1914-7829 PT Time Calculation (min) (ACUTE ONLY): 17 min  Charges:  $Therapeutic Exercise: 8-22 mins                    G Codes:      SASSER,LYNN 11-19-2014, 4:35 PM Pager [phone]

## 2014-11-10 DIAGNOSIS — I2510 Atherosclerotic heart disease of native coronary artery without angina pectoris: Secondary | ICD-10-CM | POA: Diagnosis not present

## 2014-11-10 LAB — PROTIME-INR
INR: 2.23 — ABNORMAL HIGH (ref 0.00–1.49)
PROTHROMBIN TIME: 24.5 s — AB (ref 11.6–15.2)

## 2014-11-10 LAB — GLUCOSE, CAPILLARY
GLUCOSE-CAPILLARY: 148 mg/dL — AB (ref 65–99)
GLUCOSE-CAPILLARY: 143 mg/dL — AB (ref 65–99)

## 2014-11-10 MED ORDER — INSULIN ASPART 100 UNIT/ML ~~LOC~~ SOLN: 0.0000 [IU] | Freq: Three times a day (TID) | SUBCUTANEOUS | Status: DC

## 2014-11-10 MED ORDER — CHLORHEXIDINE GLUCONATE 0.12 % MT SOLN: 15.0000 mL | Freq: Three times a day (TID) | OROMUCOSAL | Status: DC

## 2014-11-10 MED ORDER — WARFARIN SODIUM 2.5 MG PO TABS: 2.5000 mg | ORAL_TABLET | Freq: Every day | ORAL | Status: DC

## 2014-11-10 MED ORDER — FERROUS SULFATE 325 (65 FE) MG PO TABS: 325.0000 mg | ORAL_TABLET | Freq: Every day | ORAL | Status: AC

## 2014-11-10 MED ORDER — LANTUS SOLOSTAR 100 UNIT/ML ~~LOC~~ SOPN: PEN_INJECTOR | SUBCUTANEOUS | Status: DC

## 2014-11-10 MED ORDER — FOLIC ACID 1 MG PO TABS: 1.0000 mg | ORAL_TABLET | Freq: Every day | ORAL | Status: DC

## 2014-11-10 NOTE — Progress Notes (Signed)
Report called to [facility] SNF.  Education provided to family at bedside prior to DC.  Member discharged to SNF via EMS.  VS stable.  No S/S of distress.  Family at bedside for discharge.

## 2014-11-10 NOTE — Progress Notes (Signed)
Patient ambulated almost 50 feet with RN using rolling walker ,on room air,resting twice at interval.Complaints of fatigue,back to bed,O2 saturation is 96%. RedenRN

## 2014-11-10 NOTE — Progress Notes (Deleted)
Patient ambulated almost 50 feet with RN using rolling walker on room air,resting twice at interval.Patint complaints of fatigue,back to bed,O2 saturation is 96%. Reden RN

## 2014-11-10 NOTE — Progress Notes (Addendum)
      [address]       [place] [address]             [phone]        12 Days Post-Op Procedure(s) (LRB): EXPLORATION POST OPERATIVE OPEN HEART (N/A)  Subjective: Pleasantly confused. Lost his hearing aid, but later it was found.  Objective: Vital signs in last 24 hours: Temp:  [97.4 F (36.3 C)-98 F (36.7 C)] 97.4 F (36.3 C) (09/27 0608) Pulse Rate:  [84-98] 98 (09/27 0608) Cardiac Rhythm:  [-] Atrial fibrillation (09/26 1920) Resp:  [18-19] 18 (09/27 0608) BP: (130-155)/(58-98) 136/75 mmHg (09/27 0608) SpO2:  [97 %-98 %] 98 % (09/27 0608) Weight:  [206 lb 5.6 oz (93.6 kg)] 206 lb 5.6 oz (93.6 kg) (09/27 0402)  Pre op weight kg 94 kg Current Weight  11/10/14 206 lb 5.6 oz (93.6 kg)      Intake/Output from previous day: 09/26 0701 - 09/27 0700 In: 553 [P.O.:540; I.V.:13] Out: 2300 [Urine:2300]   Physical Exam:  Cardiovascular: IRRR IRRR Pulmonary: Clear to auscultation bilaterally; no rales, wheezes, or rhonchi. Abdomen: Soft, non tender, bowel sounds present. Extremities: Trace bilateral lower extremity edema. Ecchymosis inner right thigh. Wounds: Clean and dry.  No erythema or signs of infection.  Lab Results: CBC:  Recent Labs  11/08/14 0529  WBC 10.5  HGB 8.4*  HCT 25.4*  PLT 157   BMET:   Recent Labs  11/08/14 0529  NA 134*  K 3.7  CL 101  CO2 24  GLUCOSE 188*  BUN 17  CREATININE 1.19  CALCIUM 8.7*    PT/INR:  Lab Results  Component Value Date   INR 2.23* 11/10/2014   INR 2.17* 11/09/2014   INR 2.43* 11/08/2014   ABG:  INR: Will add last result for INR, ABG once components are confirmed Will add last 4 CBG results once components are confirmed  Assessment/Plan:  1. CV - Chronic a fib with HR into the 140's briefly yesterday.A fib in the 70-80's this am.On Cardizem 240 mg daily,Lopressor 25 mg bid,and Coumadin. INR slightly increased from 2.17 to 2.23. Will continue low dose Coumadin. 2.  Pulmonary -  On room air. Encourage incentive spirometer 3. Volume Overload - On Lasix 40 mg daily 4.  Acute blood loss anemia - Last H and H stable at 8.4 and 25.4. Start ferrous. 5. AKI-creatinine slightly decreased to 1.19 6. Post op delirium-NOT on narcotics.  7.DM-CBGs 120/163/148. On Metformin 1000 mg bid and Amaryl 4 mg bid. 8. Remove sutures 9. To SNF today  ZIMMERMAN,DONIELLE MPA-C 11/10/2014,7:29 AM    Chart reviewed, patient examined, agree with above.

## 2014-11-10 NOTE — Progress Notes (Signed)
Physical Therapy Treatment Patient Details Name: Frank Marks MRN: [id number] DOB: Mar 31, 1926 Today's Date: 11/10/2014    History of Present Illness 81 year old gentleman with a history of hypertension, hypercholesterolemia, acute on chronic diastolic heart failure, chronic atrial fibrillation, and chronic diastolic heart failure with progressive shortness of breath, exertional fatigue, and substernal chest discomfort. Admitted for CABGx 4 and AVR performed 9/15 with return to OR 9/16 for exploration secondary to mediastinal bleeding. Post op course complicated by AMS    PT Comments    Overall pt more confused today. Very fixated on taking care of some hunters [place] his [place] with inability to redirect him for more than a few seconds-minutes. (Even with friend present re-orienting him to the fact that there were no hunters coming until Oct 7). Only walked 10 ft before refusing to walk any farther and beginning to sit down (out of defiance) before chair was in place.    Follow Up Recommendations  SNF;Supervision/Assistance - 24 hour     Equipment Recommendations  Other (comment) (TBD)    Recommendations for Other Services       Precautions / Restrictions Precautions Precautions: Sternal;Fall Precaution Comments: very HOH Restrictions Weight Bearing Restrictions:  (sternal precautions)    Mobility  Bed Mobility Overal bed mobility: Needs Assistance Bed Mobility: Rolling;Sidelying to Sit Rolling: Min assist Sidelying to sit: Mod assist       General bed mobility comments: cues for sequence with assist to maintain sternal precautions.   Transfers Overall transfer level: Needs assistance Equipment used: Rolling walker (2 wheeled) Transfers: Sit to/from [employer] Sit to Stand: Min assist Stand pivot transfers: Min assist       General transfer comment: stood from bed and went straight to chair refusing to walk until he talked to someone about the hunters;  after friend arrived and convinced him to walk, he stood from Medical illustrator  Ambulation/Gait Ambulation/Gait assistance: Min assist;+2 safety/equipment Ambulation Distance (Feet): 10 Feet Assistive device: Rolling walker (2 wheeled) Gait Pattern/deviations: Step-to pattern;Decreased stride length;Trunk flexed   Gait velocity interpretation: Below normal speed for age/gender General Gait Details: friend reports kyphosis was present PTA; max cues fo rsafe use of RW; pt refused to walk any further than 10 ft due to wanting to talk to his friend, with encouragement he continued to refuse and began to sit before chair fullly behind him   Stairs            Wheelchair Mobility    Modified Rankin (Stroke Patients Only)       Balance           Standing balance support: No upper extremity supported Standing balance-Leahy Scale: Poor                      Cognition Arousal/Alertness: Awake/alert Behavior During Therapy: Restless;Anxious;Impulsive Overall Cognitive Status: Impaired/Different from baseline Area of Impairment: Orientation;Attention;Memory;Safety/judgement;Awareness;Problem solving Orientation Level: Place;Time;Situation Current Attention Level: Sustained Memory: Decreased recall of precautions;Decreased short-term memory Following Commands: Follows one step commands consistently Safety/Judgement: Decreased awareness of safety;Decreased awareness of deficits   Problem Solving: Slow processing;Difficulty sequencing;Requires verbal cues;Requires tactile cues General Comments: Friend arrived during session and confirmed that pt had no problems with confusion or memory PTA; on arrival pt trying to use remote as a phone; insistent he had to go take care of some hunters coming to his land (had to be reoriented to hospital and still felt he needed to go home and take care of things;  impulsively sitting with limited warning)    Exercises      General Comments         Pertinent Vitals/Pain Pain Assessment: Faces Faces Pain Scale: No hurt    Home Living                      Prior Function            PT Goals (current goals can now be found in the care plan section) Acute Rehab PT Goals Patient Stated Goal: go hunting Time For Goal Achievement: 11/16/14 Progress towards PT goals: Not progressing toward goals - comment (confusion impeding participation )    Frequency  Min 3X/week    PT Plan Current plan remains appropriate    Co-evaluation             End of Session Equipment Utilized During Treatment: Gait belt Activity Tolerance: Patient limited by fatigue;Treatment limited secondary to agitation (confusion) Patient left: with call bell/phone within reach;with chair alarm set;in chair;with family/visitor present     Time: 5784-6962 PT Time Calculation (min) (ACUTE ONLY): 19 min  Charges:  $Gait Training: 8-22 mins                    G Codes:      SASSER,LYNN 2014/11/25, 9:48 AM Pager [phone]

## 2014-11-10 NOTE — Progress Notes (Signed)
Discussed IS and sternal precautions with pt and fiance. Voiced understanding. Pt more cooperative right now and sts he will walk at SNF. Pt has not been eating due to broken tooth. I applied small guaze in between lip and tooth to protect his lip that is cut. Fiance is feeding him and he is eating. Pt is n/a for CRPII. Highly encouraged more mobility and pt agreed. [id number] Ethelda Chick CES, ACSM 12:05 PM  11/10/2014

## 2014-11-10 NOTE — Clinical Social Work Note (Signed)
Patient to be d/c'ed today to [facility].  Patient and family agreeable to plans will transport via ems RN to call report, patient's son [name] was notified.   Windell Moulding, MSW, [facility] [phone]

## 2014-11-11 ENCOUNTER — Encounter: Payer: Self-pay | Admitting: *Deleted

## 2014-11-17 ENCOUNTER — Non-Acute Institutional Stay (SKILLED_NURSING_FACILITY): Payer: Medicare Other | Admitting: Internal Medicine

## 2014-11-17 DIAGNOSIS — I482 Chronic atrial fibrillation, unspecified: Secondary | ICD-10-CM

## 2014-11-17 DIAGNOSIS — I25708 Atherosclerosis of coronary artery bypass graft(s), unspecified, with other forms of angina pectoris: Secondary | ICD-10-CM

## 2014-11-17 DIAGNOSIS — E1142 Type 2 diabetes mellitus with diabetic polyneuropathy: Secondary | ICD-10-CM

## 2014-11-17 DIAGNOSIS — I350 Nonrheumatic aortic (valve) stenosis: Secondary | ICD-10-CM | POA: Diagnosis not present

## 2014-11-17 NOTE — Progress Notes (Deleted)
Patient ID: Frank Marks, male   DOB: 30-Oct-1926, 81 y.o.   MRN: [id number]

## 2014-11-18 ENCOUNTER — Encounter: Payer: Self-pay | Admitting: Internal Medicine

## 2014-11-21 NOTE — Progress Notes (Addendum)
Patient ID: Frank Marks, male   DOB: November 16, 1926, 81 y.o.   MRN: [id number]                HISTORY & PHYSICAL  DATE:  11/17/2014        FACILITY: [facility]                    LEVEL OF CARE:   SNF   CHIEF COMPLAINT:  Admission to SNF, post stay at [facility], 10/29/2014 through 11/10/2014.            HISTORY OF PRESENT ILLNESS:  This is an 81 year-old man who apparently presented with increasing shortness of breath over the last 2-3 months prior to admission.     He had a history of known aortic stenosis, left ventricular dysfunction, with an EF of 45-50%.  He had an echocardiogram in February of this year that showed mild aortic stenosis with a mean gradient of 17 mmHg with a mild MR.  The decision was to continue medical treatment.  However, he became very limited in his activities with chest pain, shortness of breath, and weakness.   He had been taking nitroglycerin several times a day.    He was reviewed by Pulmonary and had PFTs that showed mild obstruction, mild restriction, but a severe diffusion deficit.      A repeat echo on 10/16/2014 showed moderate to severe AS but a mean gradient of only 17, but a calculated valve area of 1 sq cm.  His EF was 45% with diffuse hypokinesis, suggesting low-flow severe AS.     He was seen by Cardiac Surgery and ultimately underwent a CABG and a tissue aortic valve replacement with a pericardial tissue valve.    Postoperatively, the patient developed atrial fibrillation requiring IV amiodarone.  The amiodarone was discontinued.  He has a history of  chronic AFib and was put back on Lopressor.  Started on heparin and then Coumadin.    He developed mild postoperative delirium.  His mental status improved.    He developed postoperative leukocytosis at 17.3 on day #8 postop.  However, there was no obvious cause or infection.   His white count  went down to 10,500.      He was transferred here for rehabilitation.      PAST MEDICAL  HISTORY/PROBLEM LIST:             History of a CVA.    Pulmonary nodules.     Pulmonary hypertension.    Permanent atrial fibrillation.    Aortic stenosis.     Occlusion of carotid artery.     Diabetes type 2.    COPD, felt to be moderate.     Chronic anticoagulation.     Carotid artery stenosis.    Acute on chronic diastolic heart failure.     Anxiety.      CURRENT MEDICATIONS:  Discharge medications include:      Tylenol 650 q.4 p.r.n.       Albuterol 1 puff q.6 hours p.r.n.    Aspirin 81 q.d.      Diltiazem 240 q.d.      Ferrous sulfate 325 q.d.       Folic acid 1 mg daily.    Amaryl 4 mg daily.     Insulin sliding scale.    Metformin 1000 b.i.d.      Metoprolol 25 b.i.d.       Pravachol 80 daily.     Coumadin  2.5 daily.     Lantus.  Apparently was on this prior to admission at 20 U.  He takes this in the morning.  He came here with very unclear orders about this.      SOCIAL HISTORY:                   HOUSING:  The patient tells me he lives [place] in his own home.  Has three stairs.    FUNCTIONAL STATUS:  Was using a cane prior to admission.     TOBACCO USE:  Ex-smoker.    FAMILY HISTORY:          MOTHER:  Deceased.   Mother had Alzheimers disease.   FATHER:   Deceased.   Father had a stroke.     REVIEW OF SYSTEMS:       HEENT:   No headache.  No visual loss.    CHEST/RESPIRATORY:  Still complains of shortness of breath without cough.     CARDIAC:  No clear exertional chest pain at the moment.    GI:  No nausea, vomiting, or diarrhea.         GU:  No dysuria.    EDEMA/VARICOSITIES:  Extremities:  Has mild edema, but states this is much better than when he came in.   MUSCULOSKELETAL:   Has had bilateral total knee replacements, but does not describe severe pain.    NEUROLOGICAL:  Has had trouble with walking over the last several months, perhaps secondary to disuse/cardiopulmonary limitations.    PSYCHIATRIC:  Mental status:  It  would appear that his postoperative delirium has resolved.      PHYSICAL EXAMINATION:   VITAL SIGNS:     PULSE:  80, and probably still AFib or multiple PACs.    RESPIRATIONS:  18.      02 SATURATIONS:  97%.    GENERAL APPEARANCE:  Pleasant man in no distress.    HEENT:   MOUTH/THROAT:   Oral exam is normal.    EYES:  Previous bilateral cataract extractions.   NECK/THYROID:  No thyroid.  No masses.    CHEST/RESPIRATORY:  Clear air entry bilaterally.     CARDIOVASCULAR:   CARDIAC:  Atrial fib.  There are no murmurs.  No S3.  JVP is not elevated.    GASTROINTESTINAL:   ABDOMEN:  Soft.  No masses.    LIVER/SPLEEN/KIDNEY:   No liver, no spleen.  No tenderness.    GENITOURINARY:   BLADDER:  Not distended.   No CVA tenderness.    CIRCULATION:   EDEMA/VARICOSITIES:  Extremities:  Mild edema to just above his ankles.   There is no evidence of a DVT.   MUSCULOSKELETAL:    EXTREMITIES:  No active joints.    BILATERAL LOWER EXTREMITIES:  Aforementioned total knee replacements.     NEUROLOGICAL:   SENSATION/STRENGTH:  He has upper extremity strength and tone is normal.  Proximal strength in his lower extremities is normal.   DEEP TENDON REFLEXES:   He is diffusely hyporeflexic.  Left plantar is clearly extensor, right is flexor.   BALANCE/GAIT:  Has some difficulty getting out of a chair.  In fact, he has already brought his lift chair into the facility.    Gait is shuffling.   Balance is not too bad.   PSYCHIATRIC:   MENTAL STATUS:    I see no overt abnormalities here.  No evidence of depression, delirium, or ongoing cognitive issues.    ASSESSMENT/PLAN:  Aortic stenosis, coronary artery disease.  Status post CABG and aortic valve replacement.   His cardiopulmonary status seems to be stable.     Shortness of breath.  He states this is improving, hopefully all related to his corrected cardiac issues.  He had an extensive pulmonary work-up, including PFTs preop.    Chronic atrial  fibrillation.   His rate is controlled.   He is on Coumadin.   His INR on arrival here was 2.04.  This was on 11/11/2014.     Type 2 diabetes.  Two blood sugars were checked yesterday, a fasting at 88 and an ac. dinner at 106.   I will need to check and see if he was giving himself his own insulin at home.   Further, Amaryl at a b.i.d. dosing is excessive.  This is a once-a-day drug with a half-life in some people of over one day.  This will clearly need to be reduced, perhaps even stopped.    Gait ataxia.  Probably secondary to diabetic neuropathy and disuse.   This should improve with therapy.    COPD.  This does not seem unstable at the bedside.       CPT CODE: [address]             ADDENDUM:  It would appear that this patient went for a dental extraction yesterday.  I am not really sure who approved this, nor am I aware if they knew he was on Coumadin.

## 2014-11-23 NOTE — Progress Notes (Signed)
Patient ID: Frank Marks, male   DOB: 05/31/1926, 81 y.o.   MRN: [id number] ° ° ° °  ° ° °        HISTORY & PHYSICAL ° °DATE:  11/17/2014       ° °FACILITY: [facility]                   ° °LEVEL OF CARE:   SNF ° ° °CHIEF COMPLAINT:  Admission to SNF, post stay at [facility], 10/29/2014 through 11/10/2014.           ° °HISTORY OF PRESENT ILLNESS:  This is an 81 year-old man who apparently presented with increasing shortness of breath over the last 2-3 months prior to admission.    ° °He had a history of known aortic stenosis, left ventricular dysfunction, with an EF of 45-50%.  He had an echocardiogram in February of this year that showed mild aortic stenosis with a mean gradient of 17 mmHg with a mild MR.  The decision was to continue medical treatment.  However, he became very limited in his activities with chest pain, shortness of breath, and weakness.   He had been taking nitroglycerin several times a day.   ° °He was reviewed by Pulmonary and had PFTs that showed mild obstruction, mild restriction, but a severe diffusion deficit.     ° °A repeat echo on 10/16/2014 showed moderate to severe AS but a mean gradient of only 17, but a calculated valve area of 1 sq cm.  His EF was 45% with diffuse hypokinesis, suggesting low-flow severe AS.    ° °He was seen by Cardiac Surgery and ultimately underwent a CABG and a tissue aortic valve replacement with a pericardial tissue valve.   ° °Postoperatively, the patient developed atrial fibrillation requiring IV amiodarone.  The amiodarone was discontinued.  He has a history of  chronic AFib and was put back on Lopressor.  Started on heparin and then Coumadin.   ° °He developed mild postoperative delirium.  His mental status improved.   ° °He developed postoperative leukocytosis at 17.3 on day #8 postop.  However, there was no obvious cause or infection.   His white count  went down to 10,500.     ° °He was transferred here for rehabilitation.     ° °PAST MEDICAL  HISTORY/PROBLEM LIST:            °· History of a CVA.   °· Pulmonary nodules.    °· Pulmonary hypertension.   °· Permanent atrial fibrillation.   °· Aortic stenosis.    °· Occlusion of carotid artery.    °· Diabetes type 2.   °· COPD, felt to be moderate.    °· Chronic anticoagulation.    °· Carotid artery stenosis.   °· Acute on chronic diastolic heart failure.    °· Anxiety.     ° °CURRENT MEDICATIONS:  Discharge medications include:     °· Tylenol 650 q.4 p.r.n.      °· Albuterol 1 puff q.6 hours p.r.n.   °· Aspirin 81 q.d.     °· Diltiazem 240 q.d.     °· Ferrous sulfate 325 q.d.      °· Folic acid 1 mg daily.   °· Amaryl 4 mg daily.    °· Insulin sliding scale.   °· Metformin 1000 b.i.d.     °· Metoprolol 25 b.i.d.      °· Pravachol 80 daily.    °· Coumadin   2.5 daily.    °· Lantus.  Apparently was on this prior to admission at 20 U.  He takes this in the morning.  He came here with very unclear orders about this.     ° °SOCIAL HISTORY:                   °HOUSING:  The patient tells me [place] his own [place].  Has three stairs.    °FUNCTIONAL STATUS:  Was using a cane prior to admission.     °TOBACCO USE:  Ex-smoker.   ° °FAMILY HISTORY:          °MOTHER:  Deceased.   Mother had Alzheimer’s disease.   °FATHER:   Deceased.   Father had a stroke.    ° °REVIEW OF SYSTEMS:       °HEENT:   No headache.  No visual loss.    °CHEST/RESPIRATORY:  Still complains of shortness of breath without cough.     °CARDIAC:  No clear exertional chest pain at the moment.    °GI:  No nausea, vomiting, or diarrhea.         °GU:  No dysuria.    °EDEMA/VARICOSITIES:  Extremities:  Has mild edema, but states this is much better than when he came in.   °MUSCULOSKELETAL:   Has had bilateral total knee replacements, but does not describe severe pain.    °NEUROLOGICAL:  Has had trouble with walking over the last several months, perhaps secondary to disuse/cardiopulmonary limitations.    °PSYCHIATRIC:  Mental status:  It  would appear that his postoperative delirium has resolved.     ° °PHYSICAL EXAMINATION:   °VITAL SIGNS:     °PULSE:  82, and probably still AFib or multiple PACs.    °RESPIRATIONS:  18.      °02 SATURATIONS:  97%.    °GENERAL APPEARANCE:  Pleasant man in no distress.    °HEENT:   °MOUTH/THROAT:   Oral exam is normal.    °EYES:  Previous bilateral cataract extractions.   °NECK/THYROID:  No thyroid.  No masses.    °CHEST/RESPIRATORY:  Clear air entry bilaterally.     °CARDIOVASCULAR:   °CARDIAC:  Atrial fib.  There are no murmurs.  No S3.  JVP is not elevated.    °GASTROINTESTINAL:   °ABDOMEN:  Soft.  No masses.    °LIVER/SPLEEN/KIDNEY:   No liver, no spleen.  No tenderness.    °GENITOURINARY:   °BLADDER:  Not distended.   No CVA tenderness.    °CIRCULATION:   °EDEMA/VARICOSITIES:  Extremities:  Mild edema to just above his ankles.   There is no evidence of a DVT.   °MUSCULOSKELETAL:    °EXTREMITIES:  No active joints.    °BILATERAL LOWER EXTREMITIES:  Aforementioned total knee replacements.     °NEUROLOGICAL:   °SENSATION/STRENGTH:  He has upper extremity strength and tone is normal.  Proximal strength in his lower extremities is normal.   °DEEP TENDON REFLEXES:   He is diffusely hyporeflexic.  Left plantar is clearly extensor, right is flexor.   °BALANCE/GAIT:  Has some difficulty getting out of a chair.  In fact, he has already brought his lift chair into the facility.    Gait is shuffling.   Balance is not too bad.   °PSYCHIATRIC:   °MENTAL STATUS:    I see no overt abnormalities here.  No evidence of depression, delirium, or ongoing cognitive issues.   ° °ASSESSMENT/PLAN:           °·   Aortic stenosis, coronary artery disease.  Status post CABG and aortic valve replacement.   His cardiopulmonary status seems to be stable.    °· Shortness of breath.  He states this is improving, hopefully all related to his corrected cardiac issues.  He had an extensive pulmonary work-up, including PFTs preop.   °· Chronic atrial  fibrillation.   His rate is controlled.   He is on Coumadin.   His INR on arrival here was 2.04.  This was on 11/11/2014.    °· Type 2 diabetes.  Two blood sugars were checked yesterday, a fasting at 88 and an ac. dinner at 106.   I will need to check and see if he was giving himself his own insulin at home.   Further, Amaryl at a b.i.d. dosing is excessive.  This is a once-a-day drug with a half-life in some people of over one day.  This will clearly need to be reduced, perhaps even stopped.   °· Gait ataxia.  Probably secondary to diabetic neuropathy and disuse.   This should improve with therapy.   °· COPD.  This does not seem unstable at the bedside.     ° ° °CPT CODE: 99306            ° °ADDENDUM:  It would appear that this patient went for a dental extraction yesterday.  I am not really sure who approved this, nor am I aware if they knew he was on Coumadin.   °

## 2014-11-24 ENCOUNTER — Telehealth: Payer: Self-pay | Admitting: Cardiovascular Disease

## 2014-11-24 ENCOUNTER — Other Ambulatory Visit: Payer: Self-pay

## 2014-11-24 ENCOUNTER — Non-Acute Institutional Stay (SKILLED_NURSING_FACILITY): Payer: Medicare Other | Admitting: Internal Medicine

## 2014-11-24 DIAGNOSIS — E1142 Type 2 diabetes mellitus with diabetic polyneuropathy: Secondary | ICD-10-CM | POA: Diagnosis not present

## 2014-11-24 DIAGNOSIS — I25708 Atherosclerosis of coronary artery bypass graft(s), unspecified, with other forms of angina pectoris: Secondary | ICD-10-CM | POA: Diagnosis not present

## 2014-11-24 DIAGNOSIS — I482 Chronic atrial fibrillation, unspecified: Secondary | ICD-10-CM

## 2014-11-24 NOTE — Telephone Encounter (Signed)
New problem   Pt's son want to know if you can work pt in to see Dr Excell Seltzer on Thursday. Please advise son.

## 2014-11-24 NOTE — Progress Notes (Signed)
Patient ID: Frank Marks, male   DOB: Sep 13, 1926, 81 y.o.   MRN: [id number]    Facility; [facility] Chief complaint; follow-up medical issues from admission last week. History; this is a patient who has coronary artery disease and aortic stenosis status post CABG with aortic valve replacement with a pericardial tissue valve on 10/29/14. He has known chronic atrial fibrillation on Coumadin, history of acute on chronic diastolic heart failure his cardiac catheterization done in June demonstrated severe diffuse proximal and mid LAD stenosis total occlusion of the right coronary artery severe stenosis of the obtuse marginal branch of the left circumflex with left ventricular dysfunction with an EF of 45-50%. He has known coexistent COPD and was seen by pulmonary. PFTs showed mild obstruction mild restriction with a diffusion deficit of about 38% of predicted. Most of his symptomatology seem to be shortness of breath on exertion.  In follow-up today he stated he felt well and was telling me that he walks up the hallway with a walker towards physical therapy which is probably about 30 yards. He tells me that he develops a sub-sternal chest squeezing discomfort. He does not stop goes on and sits down and rests. He has not taken the ordered nitroglycerin. The pain is associated with shortness of breath but no nausea no diaphoresis. There is no radiation. It is difficult for me to gauge whether this is similar to his previous, preoperative, chest pain or not. He has a friend who is present to seems familiar with his case, both the friend and the patient state they think this is chest wall pain related to his incision and pressure when he is walking with a walker. He does not describe chest pain when he is in bed turning over in bed sitting up in bed. The discomfort seems reproducible with the amount of activity that he describes and he states he had it 3 times yesterday  Labs; labs done this week show a white  count of 7.4 hemoglobin of 10.1 sodium at 135 potassium 3.8 CO2 26 BUN at 19 creatinine of 1.06  CBC Latest Ref Rng 11/08/2014 11/07/2014 11/06/2014  WBC 4.0 - 10.5 K/uL 10.5 13.8(H) 17.3(H)  Hemoglobin 13.0 - 17.0 g/dL 4.0(J) 8.3(L) 8.7(L)  Hematocrit 39.0 - 52.0 % 25.4(L) 24.9(L) 25.6(L)  Platelets 150 - 400 K/uL 157 148(L) 157     Past Medical History  Diagnosis Date  . Hypertension   . Hypercholesterolemia   . Chronic back pain   . Hx of cardiovascular stress test     Lex MV 3/14:  Not gated, apical thinning, no ischemia.   Marland Kitchen Hx of echocardiogram     Echo 04/24/12: mild LVH, EF 50-55%, mild AS (mean 9), MAC, mild MR, mod-severe BAE, mod TR, PASP 39  . Acute on chronic diastolic CHF (congestive heart failure), NYHA class 3 (HCC) 07/29/2014  . TIA (transient ischemic attack) 06/2007    "had 2-3 little ones around that time"  . Carotid artery stenosis   . Chronic atrial fibrillation (HCC)   . Type II diabetes mellitus (HCC)   . Osteoarthritis   . Arthritis     "back" (07/29/2014)  . Chronic lower back pain   . Exertional shortness of breath     chronic/notes 07/29/2014  . Pneumonia ~ 1951; 02/2014  . Dysrhythmia     afib  . Heart murmur   . Stroke (HCC)     ministroke- 2xs  . Anxiety     short periods of nervousness,  reported since need for heart care, he feels anxious on & off.    Marland Kitchen COPD (chronic obstructive pulmonary disease) [facility])    Past Surgical History  Procedure Laterality Date  . Lumbar laminectomy/decompression microdiscectomy  1990's X 1; 2000's X1  . Knee arthroscopy Left   . Tonsillectomy    . Appendectomy    . Replacement total knee Bilateral ~ 2001-2002  . Total hip arthroplasty Left ~ 2003  . Joint replacement    . Cataract extraction w/ intraocular lens  implant, bilateral Bilateral   . Cardiac catheterization  07/29/2014  . Cardiac catheterization N/A 07/29/2014    Procedure: Left Heart Cath and Coronary Angiography;  Surgeon: [name], MD;  Location:  [facility] INVASIVE CV LAB;  Service: Cardiovascular;  Laterality: N/A;  . Back surgery      x2   . Eye surgery      cataracts - removed- bilateral, /w IOL  . Coronary artery bypass graft N/A 10/29/2014    Procedure: CORONARY ARTERY BYPASS GRAFTING (CABG);  Surgeon: [name], MD;  Location: [facility] OR;  Service: Open Heart Surgery;  Laterality: N/A;  . Aortic valve replacement N/A 10/29/2014    Procedure: AORTIC VALVE REPLACEMENT (AVR);  Surgeon: [name], MD;  Location: [facility] OR;  Service: Open Heart Surgery;  Laterality: N/A;  . Tee without cardioversion N/A 10/29/2014    Procedure: TRANSESOPHAGEAL ECHOCARDIOGRAM (TEE);  Surgeon: [name], MD;  Location: [facility] OR;  Service: Open Heart Surgery;  Laterality: N/A;  . Exploration post operative open heart N/A 10/29/2014    Procedure: EXPLORATION POST OPERATIVE OPEN HEART;  Surgeon: [name], MD;  Location: [facility] OR;  Service: Open Heart Surgery;  Laterality: N/A;   Current Outpatient Prescriptions on File Prior to Visit  Medication Sig Dispense Refill  . acetaminophen (TYLENOL) 325 MG tablet Take 2 tablets (650 mg total) by mouth every 4 (four) hours as needed for headache or mild pain.    . Albuterol Sulfate (PROAIR RESPICLICK) 108 (90 BASE) MCG/ACT AEPB Inhale 1 puff into the lungs every 6 (six) hours as needed (shortness of breath). 1 each 0  . aspirin EC 81 MG tablet Take 1 tablet (81 mg total) by mouth daily. 30 tablet 12  . BD PEN NEEDLE NANO U/F 32G X 4 MM MISC USE WITH INSULIN ADMINISTRATION DAILY  5  . chlorhexidine (PERIDEX) 0.12 % solution Use as directed 15 mLs in the mouth or throat 3 (three) times daily. May stop after discharged from SNF 120 mL 0  . diltiazem (CARDIZEM CD) 240 MG 24 hr capsule Take 1 capsule (240 mg total) by mouth daily. 30 capsule 6  . feeding supplement, ENSURE ENLIVE, (ENSURE ENLIVE) LIQD Take 237 mLs by mouth 2 (two) times daily between meals. 237 mL 12  . ferrous sulfate 325 (65 FE) MG tablet Take 1 tablet  (325 mg total) by mouth daily with breakfast. For one month 30 tablet 1  . folic acid (FOLVITE) 1 MG tablet Take 1 tablet (1 mg total) by mouth daily. For one month then stop. 30 tablet 0  . glimepiride (AMARYL) 4 MG tablet Take 2mg  qd    . insulin aspart (NOVOLOG) 100 UNIT/ML injection Inject 0-15 Units into the skin 3 (three) times daily with meals. U=uinits of Insulin CBG 70-120=0 U    CBG 251-300=8 U CBG 121-150=2 U  CBG 301-350=11 U CBG 151-200=3 U  CBG 351-400=15 U CBG 201-250=5 U  CBG>400, call MD 10 mL 11  .  LANTUS SOLOSTAR 100 UNIT/ML Solostar Pen Please use SS to administer Lantus (if needed). If glucose remains elevated and patient has better caloric intake, may gradually resume 20 units of Lantus in am 15 mL 3  . metFORMIN (GLUCOPHAGE-XR) 500 MG 24 hr tablet Take 2 tablets (1,000 mg total) by mouth 2 (two) times daily.    . metoprolol tartrate (LOPRESSOR) 25 MG tablet Take 1 tablet (25 mg total) by mouth 2 (two) times daily. 60 tablet 11  . pravastatin (PRAVACHOL) 80 MG tablet Take 80 mg by mouth at bedtime.     Gilman Schmidt TEST test strip 1 each by Other route 2 (two) times daily.   4  . warfarin (COUMADIN) 5.0 MG tablet Take 1 tablet       Review of systems Gen. patient states he feels well weight is 201 pounds down from 205 on admission HEENT no headache Respiratory; no cough exertional shortness of breath. No pleuritic chest pain Cardiac; no palpitations no syncope. Chest discomfort as noted above in history of present illness. GI; no nausea vomiting postprandial issues GU no dysuria Extremities no exertional limb pain no edema Endocrine; blood sugars in the low to mid 100s all day. He came to [place] on Amaryl 4 mg twice a day in the face of insulin I think this is unnecessary. I reduced him to 2 mg a day and I think this can largely stopped Neurologic; no focal weakness numbness Mental status; no evidence of depression. Seems to look to his friend for some answers to  questions  Physical examination Gen. the patient looks really very well at rest Vitals; blood pressure 116/74, respirations 16, temperature 98. O2 sat is 96% on room air HEENT no oral lesions Respiratory clear entry bilaterally no crackles no wheezing work of breathing is normal Cardiac; his incision looks stable and is healing. Heart sounds are irregular. There is no S3 JVP is not elevated there is no signs of heart failure Abdomen; no liver no spleen no tenderness no masses Extremities no edema no evidence of a DVT Neurologic no lateralizing weakness patient is able to bring himself to a standing position with ease   Impression/plan #1 exertional chest pain. It is difficult to determine whether this represents chest wall pain versus angina. I am concerned with the predictability of the exertionally related discomfort however. I'm going to put him on Imdur. He is already on Lopressor at 25 twice a day as well as diltiazem CD 240 daily. His pulse rate is in the 60s blood pressure is well controlled. I'm going to send or at least see when his follow-up is with cardiology #2 atrial fibrillation. Heart rate is controlled in the 60s. His INR has been subtherapeutic I recently increased his Coumadin to 5 mg yesterday #3 COPD; if the issue was just exertional shortness of breath I would consider this as a possible cause however he is describing chest discomfort with exertion which is predictable. I am doubtful that this explains all of this #4 type 2 diabetes; his blood sugars are really very well controlled I reduced his twice a day Amaryl at 4 mg twice a day to 2 mg daily last week. I'm not even convinced that this is necessary. #5 postoperative anemia stable to improved  Start him on Imdur. He does not have when necessary nitroglycerin which I'm going to leave. He does not have a cardiology appointment soon I'm going to try and arrange this

## 2014-11-24 NOTE — Telephone Encounter (Signed)
Per Dr Excell Seltzer he can see the pt at 4:15 on Thursday.  I spoke with the pt's son and scheduled appointment.

## 2014-11-25 ENCOUNTER — Encounter (HOSPITAL_COMMUNITY): Payer: Self-pay | Admitting: Emergency Medicine

## 2014-11-25 ENCOUNTER — Emergency Department (HOSPITAL_COMMUNITY): Payer: Medicare Other

## 2014-11-25 ENCOUNTER — Telehealth: Payer: Self-pay | Admitting: Cardiovascular Disease

## 2014-11-25 ENCOUNTER — Observation Stay (HOSPITAL_COMMUNITY)
Admission: EM | Admit: 2014-11-25 | Discharge: 2014-11-26 | Disposition: A | Discharge status: Home / Self Care | Payer: Medicare Other | Attending: Cardiovascular Disease | Admitting: Cardiovascular Disease

## 2014-11-25 DIAGNOSIS — I10 Essential (primary) hypertension: Secondary | ICD-10-CM | POA: Diagnosis not present

## 2014-11-25 DIAGNOSIS — M1990 Unspecified osteoarthritis, unspecified site: Secondary | ICD-10-CM | POA: Diagnosis not present

## 2014-11-25 DIAGNOSIS — F419 Anxiety disorder, unspecified: Secondary | ICD-10-CM | POA: Insufficient documentation

## 2014-11-25 DIAGNOSIS — Z794 Long term (current) use of insulin: Secondary | ICD-10-CM | POA: Diagnosis not present

## 2014-11-25 DIAGNOSIS — R079 Chest pain, unspecified: Secondary | ICD-10-CM | POA: Diagnosis present

## 2014-11-25 DIAGNOSIS — E119 Type 2 diabetes mellitus without complications: Secondary | ICD-10-CM

## 2014-11-25 DIAGNOSIS — I6529 Occlusion and stenosis of unspecified carotid artery: Secondary | ICD-10-CM | POA: Diagnosis not present

## 2014-11-25 DIAGNOSIS — Z8701 Personal history of pneumonia (recurrent): Secondary | ICD-10-CM | POA: Diagnosis not present

## 2014-11-25 DIAGNOSIS — I272 Pulmonary hypertension, unspecified: Secondary | ICD-10-CM | POA: Diagnosis present

## 2014-11-25 DIAGNOSIS — Z8673 Personal history of transient ischemic attack (TIA), and cerebral infarction without residual deficits: Secondary | ICD-10-CM | POA: Insufficient documentation

## 2014-11-25 DIAGNOSIS — E7800 Pure hypercholesterolemia, unspecified: Secondary | ICD-10-CM | POA: Diagnosis not present

## 2014-11-25 DIAGNOSIS — Z87891 Personal history of nicotine dependence: Secondary | ICD-10-CM | POA: Diagnosis not present

## 2014-11-25 DIAGNOSIS — Z7901 Long term (current) use of anticoagulants: Secondary | ICD-10-CM | POA: Diagnosis not present

## 2014-11-25 DIAGNOSIS — Z7982 Long term (current) use of aspirin: Secondary | ICD-10-CM | POA: Diagnosis not present

## 2014-11-25 DIAGNOSIS — G8929 Other chronic pain: Secondary | ICD-10-CM | POA: Diagnosis not present

## 2014-11-25 DIAGNOSIS — I2510 Atherosclerotic heart disease of native coronary artery without angina pectoris: Secondary | ICD-10-CM | POA: Diagnosis present

## 2014-11-25 DIAGNOSIS — I200 Unstable angina: Secondary | ICD-10-CM | POA: Diagnosis not present

## 2014-11-25 DIAGNOSIS — Z951 Presence of aortocoronary bypass graft: Secondary | ICD-10-CM

## 2014-11-25 DIAGNOSIS — R011 Cardiac murmur, unspecified: Secondary | ICD-10-CM | POA: Insufficient documentation

## 2014-11-25 DIAGNOSIS — I5033 Acute on chronic diastolic (congestive) heart failure: Secondary | ICD-10-CM | POA: Diagnosis not present

## 2014-11-25 DIAGNOSIS — I4821 Permanent atrial fibrillation: Secondary | ICD-10-CM | POA: Diagnosis present

## 2014-11-25 DIAGNOSIS — I482 Chronic atrial fibrillation: Secondary | ICD-10-CM | POA: Diagnosis not present

## 2014-11-25 DIAGNOSIS — J449 Chronic obstructive pulmonary disease, unspecified: Secondary | ICD-10-CM | POA: Insufficient documentation

## 2014-11-25 LAB — TROPONIN I

## 2014-11-25 LAB — BASIC METABOLIC PANEL
ANION GAP: 8 (ref 5–15)
BUN: 13 mg/dL (ref 6–20)
CALCIUM: 8.4 mg/dL — AB (ref 8.9–10.3)
CO2: 24 mmol/L (ref 22–32)
Chloride: 103 mmol/L (ref 101–111)
Creatinine, Ser: 1.19 mg/dL (ref 0.61–1.24)
GFR calc Af Amer: >60 mL/min (ref >60)
GFR calc non Af Amer: 54 mL/min — ABNORMAL LOW (ref >60)
GLUCOSE: 101 mg/dL — AB (ref 65–99)
POTASSIUM: 3.9 mmol/L (ref 3.5–5.1)
Sodium: 135 mmol/L (ref 135–145)

## 2014-11-25 LAB — CBC
HEMATOCRIT: 31.2 % — AB (ref 39.0–52.0)
Hemoglobin: 10.1 g/dL — ABNORMAL LOW (ref 13.0–17.0)
MCH: 28.6 pg (ref 26.0–34.0)
MCHC: 32.4 g/dL (ref 30.0–36.0)
MCV: 88.4 fL (ref 78.0–100.0)
Platelets: 177 10*3/uL (ref 150–400)
RBC: 3.53 MIL/uL — ABNORMAL LOW (ref 4.22–5.81)
RDW: 15.6 % — AB (ref 11.5–15.5)
WBC: 6.7 10*3/uL (ref 4.0–10.5)

## 2014-11-25 LAB — GLUCOSE, CAPILLARY
GLUCOSE-CAPILLARY: 127 mg/dL — AB (ref 65–99)
Glucose-Capillary: 101 mg/dL — ABNORMAL HIGH (ref 65–99)

## 2014-11-25 LAB — I-STAT TROPONIN, ED: TROPONIN I, POC: 0.01 ng/mL (ref 0.00–0.08)

## 2014-11-25 LAB — PROTIME-INR
INR: 1.68 — ABNORMAL HIGH (ref 0.00–1.49)
Prothrombin Time: 19.8 seconds — ABNORMAL HIGH (ref 11.6–15.2)

## 2014-11-25 LAB — CBG MONITORING, ED: Glucose-Capillary: 163 mg/dL — ABNORMAL HIGH (ref 65–99)

## 2014-11-25 LAB — MRSA PCR SCREENING: MRSA BY PCR: NEGATIVE

## 2014-11-25 MED ORDER — FERROUS SULFATE 325 (65 FE) MG PO TABS
325.0000 mg | ORAL_TABLET | Freq: Every day | ORAL | Status: DC
  Administered 2014-11-26: 325 mg via ORAL
  Filled 2014-11-25 (×2): qty 1

## 2014-11-25 MED ORDER — INSULIN GLARGINE 100 UNIT/ML SOLOSTAR PEN
20.0000 [IU] | PEN_INJECTOR | Freq: Every morning | SUBCUTANEOUS | Status: DC
  Filled 2014-11-25: qty 3

## 2014-11-25 MED ORDER — INSULIN ASPART 100 UNIT/ML ~~LOC~~ SOLN
0.0000 [IU] | Freq: Three times a day (TID) | SUBCUTANEOUS | Status: DC
  Administered 2014-11-26: 2 [IU] via SUBCUTANEOUS

## 2014-11-25 MED ORDER — DILTIAZEM HCL ER COATED BEADS 240 MG PO CP24
240.0000 mg | ORAL_CAPSULE | Freq: Every day | ORAL | Status: DC
  Administered 2014-11-26: 240 mg via ORAL
  Filled 2014-11-25: qty 1

## 2014-11-25 MED ORDER — ACETAMINOPHEN 325 MG PO TABS: 650.0000 mg | ORAL_TABLET | ORAL | Status: DC | PRN

## 2014-11-25 MED ORDER — ONDANSETRON HCL 4 MG/2ML IJ SOLN: 4.0000 mg | Freq: Four times a day (QID) | INTRAMUSCULAR | Status: DC | PRN

## 2014-11-25 MED ORDER — ASPIRIN EC 81 MG PO TBEC
81.0000 mg | DELAYED_RELEASE_TABLET | Freq: Every day | ORAL | Status: DC
  Administered 2014-11-26: 81 mg via ORAL
  Filled 2014-11-25: qty 1

## 2014-11-25 MED ORDER — HEPARIN (PORCINE) IN NACL 100-0.45 UNIT/ML-% IJ SOLN
1300.0000 [IU]/h | INTRAMUSCULAR | Status: DC
  Administered 2014-11-25 – 2014-11-26 (×2): 1300 [IU]/h via INTRAVENOUS
  Filled 2014-11-25 (×2): qty 250

## 2014-11-25 MED ORDER — FOLIC ACID 1 MG PO TABS
1.0000 mg | ORAL_TABLET | Freq: Every day | ORAL | Status: DC
  Administered 2014-11-26: 1 mg via ORAL
  Filled 2014-11-25: qty 1

## 2014-11-25 MED ORDER — ALBUTEROL SULFATE (2.5 MG/3ML) 0.083% IN NEBU: 3.0000 mL | INHALATION_SOLUTION | Freq: Four times a day (QID) | RESPIRATORY_TRACT | Status: DC | PRN

## 2014-11-25 MED ORDER — METOPROLOL TARTRATE 25 MG PO TABS
25.0000 mg | ORAL_TABLET | Freq: Two times a day (BID) | ORAL | Status: DC
  Administered 2014-11-25 – 2014-11-26 (×2): 25 mg via ORAL
  Filled 2014-11-25 (×2): qty 1

## 2014-11-25 MED ORDER — NITROGLYCERIN 0.4 MG SL SUBL: 0.4000 mg | SUBLINGUAL_TABLET | SUBLINGUAL | Status: DC | PRN

## 2014-11-25 MED ORDER — ATORVASTATIN CALCIUM 10 MG PO TABS
10.0000 mg | ORAL_TABLET | Freq: Every day | ORAL | Status: DC
  Administered 2014-11-26: 10 mg via ORAL
  Filled 2014-11-25: qty 1

## 2014-11-25 MED ORDER — ISOSORBIDE MONONITRATE ER 30 MG PO TB24
30.0000 mg | ORAL_TABLET | Freq: Every day | ORAL | Status: DC
  Administered 2014-11-26: 30 mg via ORAL
  Filled 2014-11-25: qty 1

## 2014-11-25 NOTE — Telephone Encounter (Signed)
New Message   Pt c/o of Chest Pain: STAT if CP now or developed within 24 hours  1. Are you having CP right now? YES  2. Are you experiencing any other symptoms (ex. SOB, nausea, vomiting, sweating)? NO  3. How long have you been experiencing CP? 11/25/14 @ 4AM   4. Is your CP continuous or coming and going? continuous  5. Have you taken Nitroglycerin? YES ?

## 2014-11-25 NOTE — ED Provider Notes (Signed)
CSN: [id number]     Arrival date & time 11/25/14  1048 History   First MD Initiated Contact with Patient 11/25/14 1104     Chief Complaint  Patient presents with  . Chest Pain     (Consider location/radiation/quality/duration/timing/severity/associated sxs/prior Treatment) Patient is a 81 y.o. male presenting with chest pain. The history is provided by the patient.  Chest Pain Associated symptoms: no abdominal pain, no back pain, no headache, no nausea, no numbness, no shortness of breath, not vomiting and no weakness    patient's had left-sided chest pressure. Around a month ago he had a CABG. Over last couple days he has had pain. Yesterday was with exertion and started somewhat suddenly. Today it has been there all day and came on at rest. No real shortness of breath. No cough. His swelling in his legs has improved. He does not know if this feels like his previous angina but thinks it may feel the same.  Past Medical History  Diagnosis Date  . Hypertension   . Hypercholesterolemia   . Chronic back pain   . Hx of cardiovascular stress test     Lex MV 3/14:  Not gated, apical thinning, no ischemia.   Marland Kitchen Hx of echocardiogram     Echo 04/24/12: mild LVH, EF 50-55%, mild AS (mean 9), MAC, mild MR, mod-severe BAE, mod TR, PASP 39  . Acute on chronic diastolic CHF (congestive heart failure), NYHA class 3 (HCC) 07/29/2014  . TIA (transient ischemic attack) 06/2007    "had 2-3 little ones around that time"  . Carotid artery stenosis   . Chronic atrial fibrillation (HCC)   . Type II diabetes mellitus (HCC)   . Osteoarthritis   . Arthritis     "back" (07/29/2014)  . Chronic lower back pain   . Exertional shortness of breath     chronic/notes 07/29/2014  . Pneumonia ~ 1951; 02/2014  . Dysrhythmia     afib  . Heart murmur   . Stroke (HCC)     ministroke- 2xs  . Anxiety     short periods of nervousness, reported since need for heart care, he feels anxious on & off.    Marland Kitchen COPD (chronic  obstructive pulmonary disease) [facility])    Past Surgical History  Procedure Laterality Date  . Lumbar laminectomy/decompression microdiscectomy  1990's X 1; 2000's X1  . Knee arthroscopy Left   . Tonsillectomy    . Appendectomy    . Replacement total knee Bilateral ~ 2001-2002  . Total hip arthroplasty Left ~ 2003  . Joint replacement    . Cataract extraction w/ intraocular lens  implant, bilateral Bilateral   . Cardiac catheterization  07/29/2014  . Cardiac catheterization N/A 07/29/2014    Procedure: Left Heart Cath and Coronary Angiography;  Surgeon: [name], MD;  Location: [facility] INVASIVE CV LAB;  Service: Cardiovascular;  Laterality: N/A;  . Back surgery      x2   . Eye surgery      cataracts - removed- bilateral, /w IOL  . Coronary artery bypass graft N/A 10/29/2014    Procedure: CORONARY ARTERY BYPASS GRAFTING (CABG);  Surgeon: [name], MD;  Location: [facility] OR;  Service: Open Heart Surgery;  Laterality: N/A;  . Aortic valve replacement N/A 10/29/2014    Procedure: AORTIC VALVE REPLACEMENT (AVR);  Surgeon: [name], MD;  Location: [facility] OR;  Service: Open Heart Surgery;  Laterality: N/A;  . Tee without cardioversion N/A 10/29/2014    Procedure: TRANSESOPHAGEAL  ECHOCARDIOGRAM (TEE);  Surgeon: [name], MD;  Location: [facility] OR;  Service: Open Heart Surgery;  Laterality: N/A;  . Exploration post operative open heart N/A 10/29/2014    Procedure: EXPLORATION POST OPERATIVE OPEN HEART;  Surgeon: [name], MD;  Location: [facility] OR;  Service: Open Heart Surgery;  Laterality: N/A;   Family History  Problem Relation Age of Onset  . Other      no family hx of coronary artery disease  . Stroke Father   . Healthy Mother   . Arthritis Mother   . Alzheimer's disease Mother    Social History  Substance Use Topics  . Smoking status: Former Smoker -- 1.00 packs/day for 5 years    Types: Cigarettes    Quit date: 08/20/1984  . Smokeless tobacco: Former Neurosurgeon    Types: Chew      Comment: "stopped smoking cigarettes in the 1970's; quit chewing ~ 2000"  . Alcohol Use: No    Review of Systems  Constitutional: Negative for activity change and appetite change.  Eyes: Negative for pain.  Respiratory: Negative for chest tightness and shortness of breath.   Cardiovascular: Positive for chest pain. Negative for leg swelling.  Gastrointestinal: Negative for nausea, vomiting, abdominal pain and diarrhea.  Genitourinary: Negative for flank pain.  Musculoskeletal: Negative for back pain and neck stiffness.  Skin: Negative for rash.  Neurological: Negative for weakness, numbness and headaches.  Psychiatric/Behavioral: Negative for behavioral problems.      Allergies  Review of patient's allergies indicates no known allergies.  Home Medications   Prior to Admission medications   Medication Sig Start Date End Date Taking? Authorizing Provider  acetaminophen (TYLENOL) 325 MG tablet Take 2 tablets (650 mg total) by mouth every 4 (four) hours as needed for headache or mild pain. 07/30/14  Yes [name], PA-C  Albuterol Sulfate (PROAIR RESPICLICK) 108 (90 BASE) MCG/ACT AEPB Inhale 1 puff into the lungs every 6 (six) hours as needed (shortness of breath). 08/21/14  Yes Lupita Leash, MD  aspirin EC 81 MG tablet Take 1 tablet (81 mg total) by mouth daily. 12/06/11  Yes [name], MD  atorvastatin (LIPITOR) 10 MG tablet Take 10 mg by mouth daily.   Yes Historical Provider, MD  chlorhexidine (PERIDEX) 0.12 % solution Use as directed 15 mLs in the mouth or throat 3 (three) times daily. May stop after discharged from SNF 11/10/14  Yes [name], PA-C  diltiazem (CARDIZEM CD) 240 MG 24 hr capsule Take 1 capsule (240 mg total) by mouth daily. 07/23/14  Yes [name], MD  feeding supplement, ENSURE ENLIVE, (ENSURE ENLIVE) LIQD Take 237 mLs by mouth 2 (two) times daily between meals. 07/30/14  Yes Abelino Derrick, PA-C  ferrous sulfate 325 (65 FE) MG tablet Take 1  tablet (325 mg total) by mouth daily with breakfast. For one month 11/10/14  Yes [name], PA-C  folic acid (FOLVITE) 1 MG tablet Take 1 tablet (1 mg total) by mouth daily. For one month then stop. 11/10/14  Yes [name], PA-C  insulin aspart (NOVOLOG) 100 UNIT/ML injection Inject 0-15 Units into the skin 3 (three) times daily with meals. U=uinits of Insulin CBG 70-120=0 U    CBG 251-300=8 U CBG 121-150=2 U  CBG 301-350=11 U CBG 151-200=3 U  CBG 351-400=15 U CBG 201-250=5 U  CBG>400, call MD 11/10/14  Yes [name], PA-C  isosorbide mononitrate (IMDUR) 30 MG 24 hr tablet Take 30 mg  by mouth daily.   Yes Historical Provider, MD  LANTUS SOLOSTAR 100 UNIT/ML Solostar Pen Please use SS to administer Lantus (if needed). If glucose remains elevated and patient has better caloric intake, may gradually resume 20 units of Lantus in am Patient taking differently: Inject 20 Units into the skin every morning.  11/10/14  Yes [name], PA-C  metFORMIN (GLUCOPHAGE-XR) 500 MG 24 hr tablet Take 2 tablets (1,000 mg total) by mouth 2 (two) times daily. 08/01/14  Yes [name], PA-C  metoprolol tartrate (LOPRESSOR) 25 MG tablet Take 1 tablet (25 mg total) by mouth 2 (two) times daily. 07/30/14  Yes [name], PA-C  NITROSTAT 0.4 MG SL tablet Place 0.4 mg under the tongue every 5 (five) minutes as needed for chest pain. Limit 3 tabs per day 09/04/14  Yes Historical Provider, MD  warfarin (COUMADIN) 5 MG tablet Take 5 mg by mouth daily.   Yes Historical Provider, MD  BD PEN NEEDLE NANO U/F 32G X 4 MM MISC USE WITH INSULIN ADMINISTRATION DAILY 07/05/14   Historical Provider, MD  TRUETRACK TEST test strip 1 each by Other route 2 (two) times daily.  06/17/14   Historical Provider, MD  warfarin (COUMADIN) 2.5 MG tablet Take 1 tablet (2.5 mg total) by mouth daily at 6 PM. Or as directed Patient not taking: Reported on 11/25/2014 11/10/14   Donielle M Joycelyn Man, PA-C   BP 106/68  mmHg  Pulse 63  Temp(Src) 97.6 F (36.4 C) (Oral)  Resp 11  Ht 6' 0.83" (1.85 m)  Wt 206 lb 5.6 oz (93.6 kg)  BMI 27.35 kg/m2  SpO2 98% Physical Exam  Constitutional: He is oriented to person, place, and time. He appears well-developed and well-nourished.  HENT:  Head: Normocephalic and atraumatic.  Eyes: EOM are normal. Pupils are equal, round, and reactive to light.  Neck: Normal range of motion. Neck supple.  Cardiovascular: Normal rate, regular rhythm and normal heart sounds.   No murmur heard. Pulmonary/Chest: Effort normal and breath sounds normal. He exhibits no tenderness.  Well-healing sternotomy wound  Abdominal: Soft. Bowel sounds are normal. He exhibits no distension. There is no tenderness. There is no rebound.  Musculoskeletal: Normal range of motion. He exhibits no edema.  Neurological: He is alert and oriented to person, place, and time. No cranial nerve deficit.  Skin: Skin is warm and dry.  Psychiatric: He has a normal mood and affect.  Nursing note and vitals reviewed.   ED Course  Procedures (including critical care time) Labs Review Labs Reviewed  CBC - Abnormal; Notable for the following:    RBC 3.53 (*)    Hemoglobin 10.1 (*)    HCT 31.2 (*)    RDW 15.6 (*)    All other components within normal limits  BASIC METABOLIC PANEL - Abnormal; Notable for the following:    Glucose, Bld 101 (*)    Calcium 8.4 (*)    GFR calc non Af Amer 54 (*)    All other components within normal limits  PROTIME-INR - Abnormal; Notable for the following:    Prothrombin Time 19.8 (*)    INR 1.68 (*)    All other components within normal limits  TROPONIN I  TROPONIN I  TROPONIN I  HEPARIN LEVEL (UNFRACTIONATED)  I-STAT TROPOININ, ED    Imaging Review Dg Chest 2 View  11/25/2014  CLINICAL DATA:  Left chest pain starting this morning. Cardiac surgery 4 weeks ago. COPD. EXAM: CHEST  2 VIEW  COMPARISON:  11/05/2014 FINDINGS: Moderate enlargement of the cardiopericardial  silhouette with evidence of prior CABG and aortic valve prosthesis. Atherosclerotic calcification of the aortic arch. Improved aeration along the right hemidiaphragm compared to 9/20 2/16. Continued small to moderate left pleural effusion with associated passive atelectasis. Faint interstitial accentuation along the lingula. Otherwise no edema. IMPRESSION: 1. Small to moderate residual left pleural effusion with passive atelectasis. There is some faint interstitial accentuation along the lingula but otherwise no findings of edema. 2. Moderate enlargement of the cardiopericardial silhouette, stable. 3. Atherosclerotic aortic arch. 4. Prior CABG and aortic valve prosthesis. Electronically Signed   By: Gaylyn RongWalter  Liebkemann M.D.   On: 11/25/2014 11:47   I have personally reviewed and evaluated these images and lab results as part of my medical decision-making.   EKG Interpretation   Date/Time:  Wednesday November 25 2014 10:57:26 EDT Ventricular Rate:  70 PR Interval:    QRS Duration: 121 QT Interval:  400 QTC Calculation: 432 R Axis:   -42 Text Interpretation:  Atrial fibrillation IVCD, consider atypical RBBB  Anterior infarct, old Nonspecific T abnormalities, lateral leads afib is  new since last tracing Confirmed by Rubin PayorPICKERING  MD, Harrold DonathNATHAN [phone]([id number]) on  11/25/2014 11:13:40 AM      MDM   Final diagnoses:  Unstable angina (HCC)   Patient with chest pain. EKG reassuring. Worrisome story for unstable angina however could have a graft failure. Enzymes negative initially. Admit to cardiology.    Benjiman CoreNathan Zaylie Gisler, MD 11/25/14 1436

## 2014-11-25 NOTE — ED Notes (Signed)
Cardiology at bedside.

## 2014-11-25 NOTE — H&P (Signed)
Frank Marks is an 81 y.o. male.   Chief Complaint: Chest Pain HPI:   Patient is an 81 year old male with history of coronary disease coronary artery bypass grafting 4 10/29/2014. He had a LIMA to the LAD, SVG to the OM1, SVG to the posterior descending and OM 3.  He also had aortic valve replacement with a 25 mm magna ease pericardial tissue valve.  He has chronic atrial fibrillation and his postop course was complicated by rapid ventricular response. He was treated with IV amiodarone and was later discontinued. He was discharged on Lopressor and Coumadin.  His last echocardiogram was prior to bypass surgery and his EF was 45% at that time.  Patient reports developing chest pressure which woke him from sleep at 0430 hours this morning associated with shortness of breath. He did not radiate to his arm neck back or jaw.  It was 9 out of 10 in intensity. He was given 2 sublingual nitroglycerin while at rehabilitation and immediately noticed a reduction in pain. He was given a third by EMS and pain resolved.  Initial troponin, which was drawn 1152hrs, was negative is no acute EKG changes. He is in atrial fibrillation with a controlled ventricular rate.  INR subtherapeutic at 1.68.  Today's chest x-ray: 1. Small to moderate residual left pleural effusion with passive atelectasis. There is some faint interstitial accentuation along the lingula but otherwise no findings of edema. 2. Moderate enlargement of the cardiopericardial silhouette, stable. 3. Atherosclerotic aortic arch. 4. Prior CABG and aortic valve prosthesis.  Medications Medication Sig  acetaminophen (TYLENOL) 325 MG tablet Take 2 tablets (650 mg total) by mouth every 4 (four) hours as needed for headache or mild pain.  Albuterol Sulfate (PROAIR RESPICLICK) 016 (90 BASE) MCG/ACT AEPB Inhale 1 puff into the lungs every 6 (six) hours as needed (shortness of breath).  aspirin EC 81 MG tablet Take 1 tablet (81 mg total) by mouth  daily.  atorvastatin (LIPITOR) 10 MG tablet Take 10 mg by mouth daily.  chlorhexidine (PERIDEX) 0.12 % solution Use as directed 15 mLs in the mouth or throat 3 (three) times daily. May stop after discharged from SNF  diltiazem (CARDIZEM CD) 240 MG 24 hr capsule Take 1 capsule (240 mg total) by mouth daily.  feeding supplement, ENSURE ENLIVE, (ENSURE ENLIVE) LIQD Take 237 mLs by mouth 2 (two) times daily between meals.  ferrous sulfate 325 (65 FE) MG tablet Take 1 tablet (325 mg total) by mouth daily with breakfast. For one month  folic acid (FOLVITE) 1 MG tablet Take 1 tablet (1 mg total) by mouth daily. For one month then stop.  insulin aspart (NOVOLOG) 100 UNIT/ML injection Inject 0-15 Units into the skin 3 (three) times daily with meals. U=uinits of Insulin CBG 70-120=0 U    CBG 251-300=8 U CBG 121-150=2 U  CBG 301-350=11 U CBG 151-200=3 U  CBG 351-400=15 U CBG 201-250=5 U  CBG>400, call MD  isosorbide mononitrate (IMDUR) 30 MG 24 hr tablet Take 30 mg by mouth daily.  LANTUS SOLOSTAR 100 UNIT/ML Solostar Pen Please use SS to administer Lantus (if needed). If glucose remains elevated and patient has better caloric intake, may gradually resume 20 units of Lantus in am Patient taking differently: Inject 20 Units into the skin every morning.   metFORMIN (GLUCOPHAGE-XR) 500 MG 24 hr tablet Take 2 tablets (1,000 mg total) by mouth 2 (two) times daily.  metoprolol tartrate (LOPRESSOR) 25 MG tablet Take 1 tablet (25 mg total)  by mouth 2 (two) times daily.  NITROSTAT 0.4 MG SL tablet Place 0.4 mg under the tongue every 5 (five) minutes as needed for chest pain. Limit 3 tabs per day  warfarin (COUMADIN) 5 MG tablet Take 5 mg by mouth daily.  BD PEN NEEDLE NANO U/F 32G X 4 MM MISC USE WITH INSULIN ADMINISTRATION DAILY  TRUETRACK TEST test strip 1 each by Other route 2 (two) times daily.   warfarin (COUMADIN) 2.5 MG tablet Take 1 tablet (2.5 mg total) by mouth daily at 6 PM. Or as directed Patient not  taking: Reported on 11/25/2014    Past Medical History  Diagnosis Date  . Hypertension   . Hypercholesterolemia   . Chronic back pain   . Hx of cardiovascular stress test     Lex MV 3/14:  Not gated, apical thinning, no ischemia.   Marland Kitchen Hx of echocardiogram     Echo 04/24/12: mild LVH, EF 50-55%, mild AS (mean 9), MAC, mild MR, mod-severe BAE, mod TR, PASP 39  . Acute on chronic diastolic CHF (congestive heart failure), NYHA class 3 ([place]) 07/29/2014  . TIA (transient ischemic attack) 06/2007    "had 2-3 little ones around that time"  . Carotid artery stenosis   . Chronic atrial fibrillation ([place])   . Type II diabetes mellitus ([place])   . Osteoarthritis   . Arthritis     "back" (07/29/2014)  . Chronic lower back pain   . Exertional shortness of breath     chronic/notes 07/29/2014  . Pneumonia ~ 1951; 02/2014  . Dysrhythmia     afib  . Heart murmur   . Stroke ([place])     ministroke- 2xs  . Anxiety     short periods of nervousness, reported since need for heart care, he feels anxious on & off.    Marland Kitchen COPD (chronic obstructive pulmonary disease) [facility])     Past Surgical History  Procedure Laterality Date  . Lumbar laminectomy/decompression microdiscectomy  1990's X 1; 2000's X1  . Knee arthroscopy Left   . Tonsillectomy    . Appendectomy    . Replacement total knee Bilateral ~ 2001-2002  . Total hip arthroplasty Left ~ 2003  . Joint replacement    . Cataract extraction w/ intraocular lens  implant, bilateral Bilateral   . Cardiac catheterization  07/29/2014  . Cardiac catheterization N/A 07/29/2014    Procedure: Left Heart Cath and Coronary Angiography;  Surgeon: Sherren Mocha, MD;  Location: [place] [facility];  Service: Cardiovascular;  Laterality: N/A;  . Back surgery      x2   . Eye surgery      cataracts - removed- bilateral, /w IOL  . Coronary artery bypass graft N/A 10/29/2014    Procedure: CORONARY ARTERY BYPASS GRAFTING (CABG);  Surgeon: Gaye Pollack, MD;  Location: [place];   Service: Open Heart Surgery;  Laterality: N/A;  . Aortic valve replacement N/A 10/29/2014    Procedure: AORTIC VALVE REPLACEMENT (AVR);  Surgeon: Gaye Pollack, MD;  Location: [place];  Service: Open Heart Surgery;  Laterality: N/A;  . Tee without cardioversion N/A 10/29/2014    Procedure: TRANSESOPHAGEAL ECHOCARDIOGRAM (TEE);  Surgeon: Gaye Pollack, MD;  Location: [place];  Service: Open Heart Surgery;  Laterality: N/A;  . Exploration post operative open heart N/A 10/29/2014    Procedure: EXPLORATION POST OPERATIVE OPEN HEART;  Surgeon: Gaye Pollack, MD;  Location: [place] OR;  Service: Open Heart Surgery;  Laterality: N/A;  Family History  Problem Relation Age of Onset  . Other      no family hx of coronary artery disease  . Stroke Father   . Healthy Mother   . Arthritis Mother   . Alzheimer's disease Mother    Social History:  reports that he quit smoking about 30 years ago. His smoking use included Cigarettes. He has a 5 pack-year smoking history. He has quit using smokeless tobacco. His smokeless tobacco use included Chew. He reports that he does not drink alcohol or use illicit drugs.  Allergies: No Known Allergies   (Not in a hospital admission)  Results for orders placed or performed during the hospital encounter of 11/25/14 (from the past 48 hour(s))  CBC     Status: Abnormal   Collection Time: 11/25/14 11:46 AM  Result Value Ref Range   WBC 6.7 4.0 - 10.5 K/uL   RBC 3.53 (L) 4.22 - 5.81 MIL/uL   Hemoglobin 10.1 (L) 13.0 - 17.0 g/dL   HCT 31.2 (L) 39.0 - 52.0 %   MCV 88.4 78.0 - 100.0 fL   MCH 28.6 26.0 - 34.0 pg   MCHC 32.4 30.0 - 36.0 g/dL   RDW 15.6 (H) 11.5 - 15.5 %   Platelets 177 150 - 400 K/uL  Basic metabolic panel     Status: Abnormal   Collection Time: 11/25/14 11:46 AM  Result Value Ref Range   Sodium 135 135 - 145 mmol/L   Potassium 3.9 3.5 - 5.1 mmol/L   Chloride 103 101 - 111 mmol/L   CO2 24 22 - 32 mmol/L   Glucose, Bld 101 (H) 65 - 99 mg/dL   BUN 13 6  - 20 mg/dL   Creatinine, Ser 1.19 0.61 - 1.24 mg/dL   Calcium 8.4 (L) 8.9 - 10.3 mg/dL   GFR calc non Af Amer 54 (L) >60 mL/min   GFR calc Af Amer >60 >60 mL/min    Comment: (NOTE) The eGFR has been calculated using the CKD EPI equation. This calculation has not been validated in all clinical situations. eGFR's persistently <60 mL/min signify possible Chronic Kidney Disease.    Anion gap 8 5 - 15  Protime-INR     Status: Abnormal   Collection Time: 11/25/14 11:46 AM  Result Value Ref Range   Prothrombin Time 19.8 (H) 11.6 - 15.2 seconds   INR 1.68 (H) 0.00 - 1.49  I-stat troponin, ED     Status: None   Collection Time: 11/25/14 11:52 AM  Result Value Ref Range   Troponin i, poc 0.01 0.00 - 0.08 ng/mL   Comment 3            Comment: Due to the release kinetics of cTnI, a negative result within the first hours of the onset of symptoms does not rule out myocardial infarction with certainty. If myocardial infarction is still suspected, repeat the test at appropriate intervals.    Dg Chest 2 View  11/25/2014  CLINICAL DATA:  Left chest pain starting this morning. Cardiac surgery 4 weeks ago. COPD. EXAM: CHEST  2 VIEW COMPARISON:  11/05/2014 FINDINGS: Moderate enlargement of the cardiopericardial silhouette with evidence of prior CABG and aortic valve prosthesis. Atherosclerotic calcification of the aortic arch. Improved aeration along the right hemidiaphragm compared to 9/20 2/16. Continued small to moderate left pleural effusion with associated passive atelectasis. Faint interstitial accentuation along the lingula. Otherwise no edema. IMPRESSION: 1. Small to moderate residual left pleural effusion with passive atelectasis. There is some faint  interstitial accentuation along the lingula but otherwise no findings of edema. 2. Moderate enlargement of the cardiopericardial silhouette, stable. 3. Atherosclerotic aortic arch. 4. Prior CABG and aortic valve prosthesis. Electronically Signed    By: Van Clines M.D.   On: 11/25/2014 11:47    Review of Systems  Constitutional: Negative for fever and diaphoresis.  HENT: Negative for congestion and sore throat.   Respiratory: Positive for shortness of breath. Negative for cough.   Cardiovascular: Positive for chest pain. Negative for orthopnea, leg swelling and PND.  Gastrointestinal: Negative for nausea, vomiting, abdominal pain, blood in stool and melena.  Musculoskeletal: Negative for myalgias.  Neurological: Positive for weakness (generalized). Negative for dizziness.  All other systems reviewed and are negative.   Blood pressure 108/71, pulse 65, temperature 97.6 F (36.4 C), temperature source Oral, resp. rate 30, SpO2 98 %. Physical Exam  Nursing note and vitals reviewed. Constitutional: He is oriented to person, place, and time. He appears well-developed and well-nourished.  HENT:  Head: Normocephalic and atraumatic.  Mouth/Throat: No oropharyngeal exudate.  Eyes: EOM are normal. Pupils are equal, round, and reactive to light. No scleral icterus.  Neck: Normal range of motion. Neck supple. No JVD present.  Cardiovascular: Normal rate, S1 normal and S2 normal.  An irregularly irregular rhythm present.  Pulses:      Radial pulses are 2+ on the right side, and 2+ on the left side.  1/6 sys mm  Respiratory: Effort normal and breath sounds normal. He has no wheezes. He has no rales.  GI: Soft. Bowel sounds are normal. He exhibits no distension. There is no tenderness.  Musculoskeletal: He exhibits no edema.  Neurological: He is alert and oriented to person, place, and time.  Skin: Skin is warm and dry.  Psychiatric: He has a normal mood and affect.     Assessment/Plan Principal Problem:   Unstable angina pectoris (HCC) Active Problems:   Permanent atrial fibrillation (HCC)   Essential hypertension   Diabetes mellitus type 2 in nonobese [facility])   Pulmonary hypertension-PA 50 mmHg   CAD in native artery-  severe- medical Rx   S/P CABG (coronary artery bypass graft)  81 year old male with history of coronary disease and coronary artery bypass grafting 4, 10/29/2014. He had a LIMA to the LAD, SVG to the OM1, SVG to the posterior descending and OM 3.  He also had aortic valve replacement with a 25 mm magna ease pericardial tissue valve at the same time.  He has chronic atrial fibrillation on coumadin.  Presents with unstable angina. Pain was responsive to nitroglycerin.  Initial troponin is negative and he has no acute EKG changes. His INR is subtherapeutic at 1.68. We'll start IV heparin. Hold coumadin in case troponin elevates.  Continue to cycle troponin every 6 hours 3.  If troponin remains negative to try and titrate his Imdur to 60 mg. BP is a little on the low side.  Continue Cardizem and Lopressor. The medial titrate down the Cardizem to allow for more Imdur.  Symptoms are concerning that he had a vein graft go down. However, initial troponin is negative and he has no acute EKG changes. May be related to the diagonal #1.  Tarri Fuller, PA-C 11/25/2014, 12:53 PM    Agree with note written by Luisa Dago PAC  Pt of Dr Burt Knack s/p CABG X 4 9/16 (Cath done in June) and tissue AVR on coumadin AC secondary to CAF. Mild -mod LV dysf. Admitted with sudden onset SSCP that  awakened him from sleep. CP relieved after 2 SL NTG. Currently pain free. EKG w/o acute changes. Exam benign. INR sub therapeutic. Will admit tele. IV hep (hold coumadin for now). 2D. Adjust imdur. If enx neg and no recurrent CP can be D/Cd home on coumadin AC. Otherwise , if enz + or recurrent CP will need cath to R/O early graft failure.   Quay Burow 11/25/2014 1:51 PM

## 2014-11-25 NOTE — Progress Notes (Signed)
ANTICOAGULATION CONSULT NOTE - Initial Consult  Pharmacy Consult for heparin Indication: ACS / chest pain  No Known Allergies  Patient Measurements: Height: 6' 0.83" (185 cm) (11/04/2014) Weight: 206 lb 5.6 oz (93.6 kg) (11/10/2014) IBW/kg (Calculated) : 79.52 Heparin Dosing Weight: 93.6 kg  Vital Signs: Temp: 97.6 F (36.4 C) (10/12 1052) Temp Source: Oral (10/12 1052) BP: 106/68 mmHg (10/12 1245) Pulse Rate: 63 (10/12 1245)  Labs:  Recent Labs  11/25/14 1146  HGB 10.1*  HCT 31.2*  PLT 177  LABPROT 19.8*  INR 1.68*  CREATININE 1.19    Estimated Creatinine Clearance: 51 mL/min (by C-G formula based on Cr of 1.19).   Medical History: Past Medical History  Diagnosis Date  . Hypertension   . Hypercholesterolemia   . Chronic back pain   . Hx of cardiovascular stress test     Lex MV 3/14:  Not gated, apical thinning, no ischemia.   Marland Kitchen. Hx of echocardiogram     Echo 04/24/12: mild LVH, EF 50-55%, mild AS (mean 9), MAC, mild MR, mod-severe BAE, mod TR, PASP 39  . Acute on chronic diastolic CHF (congestive heart failure), NYHA class 3 (HCC) 07/29/2014  . TIA (transient ischemic attack) 06/2007    "had 2-3 little ones around that time"  . Carotid artery stenosis   . Chronic atrial fibrillation (HCC)   . Type II diabetes mellitus (HCC)   . Osteoarthritis   . Arthritis     "back" (07/29/2014)  . Chronic lower back pain   . Exertional shortness of breath     chronic/notes 07/29/2014  . Pneumonia ~ 1951; 02/2014  . Dysrhythmia     afib  . Heart murmur   . Stroke (HCC)     ministroke- 2xs  . Anxiety     short periods of nervousness, reported since need for heart care, he feels anxious on & off.    Marland Kitchen. COPD (chronic obstructive pulmonary disease) (HCC)     Medications:   (Not in a hospital admission)  Assessment: 89 yoM w/ a history of CAD s/p 4 vessel bypass on 10/29/2014, presenting to ED w/ chest pain responsive to nitroglycerin. No changes on EKG, troponin  negative.  He was taking warfarin PTA at a dose of 5 mg daily per nursing home MAR.  INR 1.68, Hgb 10.1, Plt 177  Will initiate heparin gtt for now and hold warfarin  Goal of Therapy:  Heparin level 0.3-0.7 units/ml Monitor platelets by anticoagulation protocol: Yes   Plan:  Will not bolus given INR elevation  Start heparin infusion at 1300 units/hr Check anti-Xa level in 8 hours and daily while on heparin Continue to monitor H&H and platelets with daily CBC  Arcola JanskyMeagan Khamryn Calderone, PharmD Clinical Pharmacy Resident Pager: [phone] 11/25/2014,2:35 PM

## 2014-11-25 NOTE — ED Notes (Addendum)
Pt family member at nurses station requesting update on room, RN informed pt family member of waiting on room but offered to order pt a meal. Service response called and meal trya ordered for pt.

## 2014-11-25 NOTE — ED Notes (Signed)
Per EMS [facility], patient awoke this morning with 9/10 chest pain.  Facility gave 2 nitro, pain reduced to 2/10.  EMS gave 2x nitro, pain from 2/10 to 0/10.  Patient has history of quadruple bypass.  EMS gave 324 aspirin en route.  Patient states pain has currently stopped completely.  Patient in no apparent distress at this time.

## 2014-11-25 NOTE — Telephone Encounter (Signed)
I spoke with the pt and he complains of off and on left sided chest pain that started this morning at 4 AM.  The pt has taken 2 nitroglycerin and had some relief of symptoms.  At this time the pt denies SOB, nausea, vomiting or radiation of pain. The pt cannot reproduce pain with movement or inspiration and he currently rates his pain as 4/10 after taking a nitroglycerin.  The pt feels like his chest pain is similar to what he felt prior to surgery. The pt has coughed a few times while on the phone but he denies fever or signs of respiratory infection.  Due to the pt's current pain and improvement of symptoms with NTG I advised that the pt should [facility].  The pt agreed with plan and I also spoke with the pt's son [name] in regards to symptoms and going to the ER. [name] would like the pt transported by EMS and plans to call 911. [facility] notified that the pt is coming to the ER.  (FYI: the pt did have evaluation at nursing home yesterday and described a number of symptoms, note in EPIC)

## 2014-11-26 ENCOUNTER — Encounter: Payer: Medicare Other | Admitting: Cardiovascular Disease

## 2014-11-26 ENCOUNTER — Encounter: Payer: Medicare Other | Admitting: Physician Assistant

## 2014-11-26 DIAGNOSIS — E7800 Pure hypercholesterolemia, unspecified: Secondary | ICD-10-CM | POA: Diagnosis not present

## 2014-11-26 DIAGNOSIS — I200 Unstable angina: Secondary | ICD-10-CM | POA: Diagnosis not present

## 2014-11-26 DIAGNOSIS — I10 Essential (primary) hypertension: Secondary | ICD-10-CM | POA: Diagnosis not present

## 2014-11-26 DIAGNOSIS — G8929 Other chronic pain: Secondary | ICD-10-CM | POA: Diagnosis not present

## 2014-11-26 DIAGNOSIS — I482 Chronic atrial fibrillation: Secondary | ICD-10-CM | POA: Diagnosis not present

## 2014-11-26 DIAGNOSIS — I2510 Atherosclerotic heart disease of native coronary artery without angina pectoris: Secondary | ICD-10-CM | POA: Diagnosis not present

## 2014-11-26 LAB — LIPID PANEL
CHOL/HDL RATIO: 3.4 ratio
Cholesterol: 105 mg/dL (ref 0–200)
HDL: 31 mg/dL — AB (ref >40)
LDL Cholesterol: 55 mg/dL (ref 0–99)
Triglycerides: 95 mg/dL (ref <150)
VLDL: 19 mg/dL (ref 0–40)

## 2014-11-26 LAB — BASIC METABOLIC PANEL
ANION GAP: 11 (ref 5–15)
BUN: 22 mg/dL — ABNORMAL HIGH (ref 6–20)
CO2: 24 mmol/L (ref 22–32)
Calcium: 9.2 mg/dL (ref 8.9–10.3)
Chloride: 101 mmol/L (ref 101–111)
Creatinine, Ser: 1.34 mg/dL — ABNORMAL HIGH (ref 0.61–1.24)
GFR calc non Af Amer: 47 mL/min — ABNORMAL LOW (ref >60)
GFR, EST AFRICAN AMERICAN: 54 mL/min — AB (ref >60)
Glucose, Bld: 135 mg/dL — ABNORMAL HIGH (ref 65–99)
POTASSIUM: 4.1 mmol/L (ref 3.5–5.1)
SODIUM: 136 mmol/L (ref 135–145)

## 2014-11-26 LAB — GLUCOSE, CAPILLARY
GLUCOSE-CAPILLARY: 112 mg/dL — AB (ref 65–99)
Glucose-Capillary: 134 mg/dL — ABNORMAL HIGH (ref 65–99)

## 2014-11-26 LAB — CBC
HCT: 32 % — ABNORMAL LOW (ref 39.0–52.0)
Hemoglobin: 10.4 g/dL — ABNORMAL LOW (ref 13.0–17.0)
MCH: 28.2 pg (ref 26.0–34.0)
MCHC: 32.5 g/dL (ref 30.0–36.0)
MCV: 86.7 fL (ref 78.0–100.0)
PLATELETS: 171 10*3/uL (ref 150–400)
RBC: 3.69 MIL/uL — AB (ref 4.22–5.81)
RDW: 15.6 % — AB (ref 11.5–15.5)
WBC: 7.7 10*3/uL (ref 4.0–10.5)

## 2014-11-26 LAB — HEPARIN LEVEL (UNFRACTIONATED)
HEPARIN UNFRACTIONATED: 0.61 [IU]/mL (ref 0.30–0.70)
HEPARIN UNFRACTIONATED: 0.49 [IU]/mL (ref 0.30–0.70)

## 2014-11-26 LAB — TROPONIN I: Troponin I: <0.03 ng/mL (ref <0.031)

## 2014-11-26 MED ORDER — ISOSORBIDE MONONITRATE ER 60 MG PO TB24: 60.0000 mg | ORAL_TABLET | Freq: Every day | ORAL | Status: DC

## 2014-11-26 NOTE — Discharge Instructions (Signed)
WILL NEED COUMADIN CHECK (INR) WITHIN 2-3 DAYS AND POSSIBLE COUMADIN DOSE ADJUSTMENT AT THAT TIME.

## 2014-11-26 NOTE — Progress Notes (Signed)
Assumed care from off going RN; patient alert & oriented; patient shows no signs of distress; no c/o's; denies pain; call bell in reach; will continue to monitor patient

## 2014-11-26 NOTE — Progress Notes (Signed)
Patient Name: Frank Marks Date of Encounter: 11/26/2014  Principal Problem:   Unstable angina pectoris [facility](HCC) Active Problems:   Permanent atrial fibrillation (HCC)   Essential hypertension   Diabetes mellitus type 2 in nonobese [facility](HCC)   Pulmonary hypertension-PA 50 mmHg   CAD in native artery- severe- medical Rx   S/P CABG (coronary artery bypass graft)   Unstable angina [facility](HCC)  Primary Cardiologist: Dr. Excell Seltzerooper Patient Profile: 81 yo male w/PMH of CAD (s/p CABG 4 10/29/2014 with LIMA-LAD, SVG-OM1, SVG- posterior descending and OM 3), diastolic CHF, s/p AVR 10/29/2014, chronic atrial fibrillation, HTN, HLD, Pulmonary HTN,  and Type 2 DM who presented on 11/25/2014 with unstable angina. INR subtherapeutic at 1.68.  SUBJECTIVE: Feeling well. Denies any recurrence in chest pain. No shortness of breath or palpitations.  OBJECTIVE Filed Vitals:   11/25/14 1600 11/25/14 1644 11/25/14 2055 11/26/14 0603  BP: 105/71 124/80 118/63 122/84  Pulse: 65 68 72 55  Temp:  97.6 F (36.4 C) 97.9 F (36.6 C) 97.6 F (36.4 C)  TempSrc:  Oral Oral Oral  Resp:   20 18  Height:  6' (1.829 m)    Weight:  196 lb 3.4 oz (89 kg)  198 lb 6.6 oz (90 kg)  SpO2: 99% 97% 98% 99%    Intake/Output Summary (Last 24 hours) at 11/26/14 0937 Last data filed at 11/26/14 [id number]  Gross per 24 hour  Intake      0 ml  Output    555 ml  Net   -555 ml   Filed Weights   11/25/14 1400 11/25/14 1644 11/26/14 0603  Weight: 206 lb 5.6 oz (93.6 kg) 196 lb 3.4 oz (89 kg) 198 lb 6.6 oz (90 kg)    PHYSICAL EXAM General: Well developed, well nourished, male in no acute distress. Head: Normocephalic, atraumatic.  Neck: Supple without bruits, JVD . Lungs:  Resp regular and unlabored, CTA without wheezing or rales. Heart: Irregularly irregular, S1, S2, no S3, S4, or murmur; no rub. Abdomen: Soft, non-tender, non-distended with normoactive bowel sounds. No hepatomegaly. No rebound/guarding. No obvious abdominal  masses. Extremities: No clubbing, cyanosis, or edema. Distal pedal pulses are 2+ bilaterally. Neuro: Alert and oriented X 3. Moves all extremities spontaneously. Psych: Normal affect.    LABS: CBC: Recent Labs  11/25/14 1146 11/26/14 0146  WBC 6.7 7.7  HGB 10.1* 10.4*  HCT 31.2* 32.0*  MCV 88.4 86.7  PLT 177 171   INR: Recent Labs  11/25/14 1146  INR 1.68*   Basic Metabolic Panel: Recent Labs  11/25/14 1146 11/26/14 0146  NA 135 136  K 3.9 4.1  CL 103 101  CO2 24 24  GLUCOSE 101* 135*  BUN 13 22*  CREATININE 1.19 1.34*  CALCIUM 8.4* 9.2   Cardiac Enzymes: Recent Labs  11/25/14 1535 11/25/14 2136 11/26/14 0146  TROPONINI <0.03 <0.03 <0.03    Recent Labs  11/25/14 1152  TROPIPOC 0.01   Fasting Lipid Panel: Recent Labs  11/26/14 0146  CHOL 105  HDL 31*  LDLCALC 55  TRIG 95  CHOLHDL 3.4   TELE:   Atrial fibrillation, rate-controlled, with rate in 60's- 70's     ECG: Atrial fibrillation w/ rate in 60's. LBBB. No changes from previous tracing.  Radiology/Studies: Dg Chest 2 View: 11/25/2014  CLINICAL DATA:  Left chest pain starting this morning. Cardiac surgery 4 weeks ago. COPD. EXAM: CHEST  2 VIEW COMPARISON:  11/05/2014 FINDINGS: Moderate enlargement of the cardiopericardial silhouette with evidence  of prior CABG and aortic valve prosthesis. Atherosclerotic calcification of the aortic arch. Improved aeration along the right hemidiaphragm compared to 9/20 2/16. Continued small to moderate left pleural effusion with associated passive atelectasis. Faint interstitial accentuation along the lingula. Otherwise no edema. IMPRESSION: 1. Small to moderate residual left pleural effusion with passive atelectasis. There is some faint interstitial accentuation along the lingula but otherwise no findings of edema. 2. Moderate enlargement of the cardiopericardial silhouette, stable. 3. Atherosclerotic aortic arch. 4. Prior CABG and aortic valve prosthesis.  Electronically Signed   By: Gaylyn Rong M.D.   On: 11/25/2014 11:47     Current Medications:  . aspirin EC  81 mg Oral Daily  . atorvastatin  10 mg Oral Daily  . diltiazem  240 mg Oral Daily  . ferrous sulfate  325 mg Oral Q breakfast  . folic acid  1 mg Oral Daily  . insulin aspart  0-15 Units Subcutaneous TID WC  . Insulin Glargine  20 Units Subcutaneous q morning - 10a  . isosorbide mononitrate  30 mg Oral Daily  . metoprolol tartrate  25 mg Oral BID   . heparin 1,300 Units/hr (11/26/14 0855)    ASSESSMENT AND PLAN:  1. Unstable angina pectoris  - history of CAD s/p CABG 4 10/29/2014 with LIMA-LAD, SVG-OM1, SVG- posterior descending and OM 3 - cyclic troponin values have been negative. - EKG without acute changes. Has been on IV Heparin in case troponin elevated. Will d/c Heparin with return back to Coumadin. - continue ASA, statin, BB, and Imdur. Increase Imdur to  daily.  2. s/p Aortic Valve Replacement  - 10/29/2014 - INR 1.68 on admission. Will have him follow closely with coumadin clinic.  3. Permanent atrial fibrillation - Continue Cardizem and Metoprolol - Heparin for anticoagulation. Will need to restart Coumadin at discharge.  4. Essential hypertension - BP has been 100/52 - 127/84 while admitted  5. Diabetes mellitus type 2 in nonobese [facility]) - SSI while admitted   Signed, Ellsworth Lennox , PA-C 9:37 AM 11/26/2014 Pager: [phone]   Agree with note by Reita May  No CP overnight. Enz neg. Exam benign. Afib with CVR. INR sub therapeutic. OK for DC home. Adjust coumadin dose. F/U with Dr/ Corinna Capra, M.D., FACP, [facility], Kathryne Eriksson [facility] [address] [place], [place]  [address]  [phone] 11/26/2014 10:03 AM

## 2014-11-26 NOTE — Progress Notes (Signed)
ANTICOAGULATION CONSULT NOTE - Follow Up Consult  Pharmacy Consult for heparin Indication: Afib and USAP  Labs:  Recent Labs  11/25/14 1146 11/25/14 1535 11/25/14 2136 11/25/14 2349  HGB 10.1*  --   --   --   HCT 31.2*  --   --   --   PLT 177  --   --   --   LABPROT 19.8*  --   --   --   INR 1.68*  --   --   --   HEPARINUNFRC  --   --   --  0.49  CREATININE 1.19  --   --   --   TROPONINI  --  <0.03 <0.03  --     Assessment/Plan:  81yo male therapeutic on heparin after rate change. Will continue gtt at current rate and confirm stable with am labs.   Vernard GamblesVeronda Ebba Goll, PharmD, BCPS  11/26/2014,12:46 AM

## 2014-11-26 NOTE — Discharge Summary (Signed)
CARDIOLOGY DISCHARGE SUMMARY   Patient ID: Frank Marks MRN: [id number] DOB/AGE: 79-Jun-1928 79 y.o.  Admit date: 11/25/2014 Discharge date: 11/26/2014  PCP: Ailene Ravel, MD Primary Cardiologist: Dr. Excell Seltzer   Primary Discharge Diagnosis: Unstable angina pectoris [facility]) Secondary Discharge Diagnosis: Permanent atrial fibrillation, Essential hypertension, Diabetes mellitus type 2 in non-obese, Pulmonary hypertension-PA 50 mmHg, CAD in native artery- severe- medical Rx, S/P CABG (coronary artery bypass graft)  Consults: None   Procedures: None  Hospital Course: Frank Marks is a 81 y.o. male with past medical history of CAD (s/p CABG, 10/29/2014, LIMA to the LAD, SVG to the OM1, SVG to the posterior descending and OM 3), Aortic Valve Replacement on 10/29/2014, chronic atrial fibrillation (on Coumadin), HTN, HLD, chronic diastolic CHF, Pulmonary HTN, and Type 2 DM who presented to [facility] ED on 11/25/2014 for unstable angina.  He awoke that morning with 9/10 chest pain which was significantly reduced with SL NTG. He also reported associated dyspnea but no nausea, vomiting, or radiating pain. Upon arrival to the ED, he was without any recurrent chest pain. While in the ED, his initial troponin was negative and his EKG showed no acute changes. His INR was noted to be subtherapeutic at 1.68.  IV Heparin was started due to concern for possible graft collapse. Overnight, cyclic troponins were obtained and remained negative. Further EKG tracings were without acute changes.   On the morning of 11/26/2014, he was doing well and denied any recurrence in his chest pain. Reported no other anginal symptoms such as shortness of breath, palpitations, nausea, or vomiting. Vitals signs and lab results were reviewed and appeared stable.   Patient was last examined by Dr. Allyson Sabal and deemed stable for discharge. He will need an INR within the nest few days and possible Coumadin dose adjustment  at that time if needed. He will be returning to [facility]. His only medication adjustment this admission was increasing Imdur from  daily to  daily. He has a scheduled cardiology follow-up appointment with Tereso Newcomer, PA-C on 12/10/2014.  Labs:   Lab Results  Component Value Date   WBC 7.7 11/26/2014   HGB 10.4* 11/26/2014   HCT 32.0* 11/26/2014   MCV 86.7 11/26/2014   PLT 171 11/26/2014     Recent Labs Lab 11/26/14 0146  NA 136  K 4.1  CL 101  CO2 24  BUN 22*  CREATININE 1.34*  CALCIUM 9.2  GLUCOSE 135*    Recent Labs  11/25/14 1535 11/25/14 2136 11/26/14 0146  TROPONINI <0.03 <0.03 <0.03   Lipid Panel     Component Value Date/Time   CHOL 105 11/26/2014 0146   TRIG 95 11/26/2014 0146   HDL 31* 11/26/2014 0146   CHOLHDL 3.4 11/26/2014 0146   VLDL 19 11/26/2014 0146   LDLCALC 55 11/26/2014 0146    PRO B NATRIURETIC PEPTIDE (BNP)  Date/Time Value Ref Range Status  07/23/2014 04:19 PM 411.0* 0.0 - 100.0 pg/mL Final  04/24/2012 10:58 AM 106.0* 0.0 - 100.0 pg/mL Final    Recent Labs  11/25/14 1146  INR 1.68*      Radiology: Dg Chest 2 View: 11/25/2014  CLINICAL DATA:  Left chest pain starting this morning. Cardiac surgery 4 weeks ago. COPD. EXAM: CHEST  2 VIEW COMPARISON:  11/05/2014 FINDINGS: Moderate enlargement of the cardiopericardial silhouette with evidence of prior CABG and aortic valve prosthesis. Atherosclerotic calcification of the aortic arch. Improved aeration along the right hemidiaphragm compared to 9/20 2/16. Continued  small to moderate left pleural effusion with associated passive atelectasis. Faint interstitial accentuation along the lingula. Otherwise no edema. IMPRESSION: 1. Small to moderate residual left pleural effusion with passive atelectasis. There is some faint interstitial accentuation along the lingula but otherwise no findings of edema. 2. Moderate enlargement of the cardiopericardial silhouette, stable. 3.  Atherosclerotic aortic arch. 4. Prior CABG and aortic valve prosthesis. Electronically Signed   By: Gaylyn RongWalter  Liebkemann M.D.   On: 11/25/2014 11:47    EKG: Atrial Fibrillation with rate in 60's. LBBB. No acute changes from previous tracing.   FOLLOW UP PLANS AND APPOINTMENTS No Known Allergies   Medication List    TAKE these medications        acetaminophen 325 MG tablet  Commonly known as:  TYLENOL  Take 2 tablets (650 mg total) by mouth every 4 (four) hours as needed for headache or mild pain.     Albuterol Sulfate 108 (90 BASE) MCG/ACT Aepb  Commonly known as:  PROAIR RESPICLICK  Inhale 1 puff into the lungs every 6 (six) hours as needed (shortness of breath).     aspirin EC 81 MG tablet  Take 1 tablet (81 mg total) by mouth daily.     atorvastatin 10 MG tablet  Commonly known as:  LIPITOR  Take 10 mg by mouth daily.     BD PEN NEEDLE NANO U/F 32G X 4 MM Misc  Generic drug:  Insulin Pen Needle  USE WITH INSULIN ADMINISTRATION DAILY     chlorhexidine 0.12 % solution  Commonly known as:  PERIDEX  Use as directed 15 mLs in the mouth or throat 3 (three) times daily. May stop after discharged from SNF     diltiazem 240 MG 24 hr capsule  Commonly known as:  CARDIZEM CD  Take 1 capsule (240 mg total) by mouth daily.     feeding supplement (ENSURE ENLIVE) Liqd  Take 237 mLs by mouth 2 (two) times daily between meals.     ferrous sulfate 325 (65 FE) MG tablet  Take 1 tablet (325 mg total) by mouth daily with breakfast. For one month     folic acid 1 MG tablet  Commonly known as:  FOLVITE  Take 1 tablet (1 mg total) by mouth daily. For one month then stop.     insulin aspart 100 UNIT/ML injection  Commonly known as:  novoLOG  Inject 0-15 Units into the skin 3 (three) times daily with meals. U=uinits of Insulin CBG 70-120=0 U    CBG 251-300=8 U CBG 121-150=2 U  CBG 301-350=11 U CBG 151-200=3 U  CBG 351-400=15 U CBG 201-250=5 U  CBG>400, call MD     isosorbide mononitrate  60 MG 24 hr tablet  Commonly known as:  IMDUR  Take 1 tablet (60 mg total) by mouth daily.     LANTUS SOLOSTAR 100 UNIT/ML Solostar Pen  Generic drug:  Insulin Glargine  Please use SS to administer Lantus (if needed). If glucose remains elevated and patient has better caloric intake, may gradually resume 20 units of Lantus in am     metFORMIN 500 MG 24 hr tablet  Commonly known as:  GLUCOPHAGE-XR  Take 2 tablets (1,000 mg total) by mouth 2 (two) times daily.     metoprolol tartrate 25 MG tablet  Commonly known as:  LOPRESSOR  Take 1 tablet (25 mg total) by mouth 2 (two) times daily.     NITROSTAT 0.4 MG SL tablet  Generic drug:  nitroGLYCERIN  Place  0.4 mg under the tongue every 5 (five) minutes as needed for chest pain. Limit 3 tabs per day     TRUETRACK TEST test strip  Generic drug:  glucose blood  1 each by Other route 2 (two) times daily.     warfarin 5 MG tablet  Commonly known as:  COUMADIN  Take 5 mg by mouth daily.         Follow-up Information    Follow up with Tereso Newcomer, PA-C On 12/10/2014.   Specialties:  Physician Assistant, Radiology, Interventional Cardiology   Why:  Cardiology Hospital Follow-Up on 12/10/2014 at 2:20PM.   Contact information:   [address] [place] [address] [phone]      BRING ALL MEDICATIONS WITH YOU TO FOLLOW UP APPOINTMENTS  Time spent with patient to include physician time: 35 minutes Signed: Ellsworth Lennox, PA 11/26/2014, 11:07 AM Co-Sign MD

## 2014-11-30 ENCOUNTER — Encounter: Payer: Medicare Other | Admitting: Physician Assistant

## 2014-12-08 ENCOUNTER — Other Ambulatory Visit: Payer: Self-pay | Admitting: Surgery

## 2014-12-08 DIAGNOSIS — Z951 Presence of aortocoronary bypass graft: Secondary | ICD-10-CM

## 2014-12-09 ENCOUNTER — Ambulatory Visit (INDEPENDENT_AMBULATORY_CARE_PROVIDER_SITE_OTHER): Payer: Self-pay | Admitting: Surgery

## 2014-12-09 ENCOUNTER — Ambulatory Visit
Admission: RE | Admit: 2014-12-09 | Discharge: 2014-12-09 | Disposition: A | Discharge status: Home / Self Care | Payer: Medicare Other | Source: Ambulatory Visit | Attending: Surgery | Admitting: Surgery

## 2014-12-09 ENCOUNTER — Encounter: Payer: Self-pay | Admitting: Surgery

## 2014-12-09 VITALS — BP 135/80 | HR 78 | Resp 20 | Ht 72.0 in | Wt 198.0 lb

## 2014-12-09 DIAGNOSIS — Z954 Presence of other heart-valve replacement: Secondary | ICD-10-CM

## 2014-12-09 DIAGNOSIS — I482 Chronic atrial fibrillation, unspecified: Secondary | ICD-10-CM

## 2014-12-09 DIAGNOSIS — I5022 Chronic systolic (congestive) heart failure: Secondary | ICD-10-CM

## 2014-12-09 DIAGNOSIS — Z952 Presence of prosthetic heart valve: Secondary | ICD-10-CM

## 2014-12-09 DIAGNOSIS — Z951 Presence of aortocoronary bypass graft: Secondary | ICD-10-CM

## 2014-12-09 DIAGNOSIS — I2510 Atherosclerotic heart disease of native coronary artery without angina pectoris: Secondary | ICD-10-CM

## 2014-12-09 NOTE — Progress Notes (Signed)
HPI: Patient returns for routine postoperative follow-up having undergone CABG x 4 and AVR with a 25 mm pericardial valve on 10/29/2014. The patient's early postoperative recovery while in the hospital was notable for development of postop bleeding from his sternum requiring exploration a few hrs postop. He has a slow recovery due to his age and deconditioning and did develop some postop delirium. He was discharge to a SNF. He was back in the hospital on 10/12 for two days with left sided chest pain. He says that this was not improved after 3 SL NTG at home. He had negative troponins and ECGs and his pain promptly resolved after admission and has not recurred.  Since hospital discharge the patient reports that he has improved and was discharged from the SNF to home. He has 24 hr care and has been monitored closely by his sons. He has been walking short distances but says that he tires easily.   Current Outpatient Prescriptions  Medication Sig Dispense Refill  . aspirin EC 81 MG tablet Take 1 tablet (81 mg total) by mouth daily. 30 tablet 12  . diltiazem (CARDIZEM CD) 240 MG 24 hr capsule Take 1 capsule (240 mg total) by mouth daily. 30 capsule 6  . ferrous sulfate 325 (65 FE) MG tablet Take 1 tablet (325 mg total) by mouth daily with breakfast. For one month 30 tablet 1  . glimepiride (AMARYL) 2 MG tablet Take 2 mg by mouth daily with breakfast.    . isosorbide mononitrate (IMDUR) 60 MG 24 hr tablet Take 1 tablet (60 mg total) by mouth daily. 30 tablet 6  . LANTUS SOLOSTAR 100 UNIT/ML Solostar Pen Please use SS to administer Lantus (if needed). If glucose remains elevated and patient has better caloric intake, may gradually resume 20 units of Lantus in am (Patient taking differently: Inject 20 Units into the skin every morning. ) 15 mL 3  . metFORMIN (GLUCOPHAGE-XR) 500 MG 24 hr tablet Take 2 tablets (1,000 mg total) by mouth 2 (two) times daily.    . metoprolol tartrate (LOPRESSOR) 25 MG  tablet Take 1 tablet (25 mg total) by mouth 2 (two) times daily. 60 tablet 11  . NITROSTAT 0.4 MG SL tablet Place 0.4 mg under the tongue every 5 (five) minutes as needed for chest pain. Limit 3 tabs per day  2  . pravastatin (PRAVACHOL) 80 MG tablet Take 80 mg by mouth daily.    Marland Kitchen warfarin (COUMADIN) 5 MG tablet Take 5 mg by mouth daily.     No current facility-administered medications for this visit.    Physical Exam: BP 135/80 mmHg  Pulse 78  Resp 20  Ht 6' (1.829 m)  Wt 198 lb (89.812 kg)  BMI 26.85 kg/m2  SpO2 97% He looks weak but better than he did at discharge. Lung exam is clear. Cardiac exam shows a regular rate and rhythm with normal heart sounds. Chest incision is healing well and sternum is stable. The leg incisions are healing well and there is no peripheral edema.   Diagnostic Tests:  CLINICAL DATA: Follow up cardiac surgery 10/29/2014.  EXAM: CHEST 2 VIEW  COMPARISON: 11/05/2014 and 11/25/2014.  FINDINGS: The heart size and mediastinal contours are stable status post median sternotomy and CABG/AVR. Left lower lobe atelectasis has improved. There is a persistent small left pleural effusion. No edema or pneumothorax. The bones appear unchanged.  IMPRESSION: Improving left basilar aeration following recent median sternotomy. Persistent small left pleural effusion.  Electronically Signed  By: Carey BullocksWilliam Veazey M.D.  On: 12/09/2014 13:08  Impression:  Overall I think he is making slow but steady progress considering the magnitude of the surgery and age and preop deconditioning. I encouraged him to continue walking as much as possible. He needs to build up some stamina and this will not happen if he does not push himself. He is on coumadin for chronic atrial fibrillation and his last INR was 2.1. His therapeutic range is 2-2.5. It is not completely clear what the cause of his chest pain was that prompted readmission but it could have been postop  incisional chest wall pain since everything else was negative and it has not recurred.  Plan:  He will continue to follow up with Dr. Nathanial RancherHamrick and Dr. Excell Seltzerooper.   Alleen BorneBryan K Bartle, MD [facility] [phone]

## 2014-12-09 NOTE — Progress Notes (Signed)
Cardiology Office Note   Date:  12/10/2014   ID:  Frank PlantsClyde D Genis, DOB 1926-06-01, MRN [id number]  PCP:  Ailene RavelHAMRICK,MAURA L, MD  Cardiologist:  Dr. Tonny BollmanMichael Cooper   Electrophysiologist:  n/a  Chief Complaint  Patient presents with  . Hospitalization Follow-up    admit with chest pain  . Coronary Artery Disease     History of Present Illness: Frank Marks is a 81 y.o. male with a hx of CAD, chronic AFib, systolic HF.  He presented in 6/16 with progressive dyspnea, exercise intolerance and chest pain.  He underwent cardiac catheterization demonstrating severe diffuse proximal and mid LAD stenosis, total occlusion of the right coronary artery, and severe stenosis of the obtuse marginal branch of the left circumflex. He has been noted to have mild LV dysfunction.  PCI was not a good option and CABG was initially deferred due to advanced age.  He continued to have progressive decline in function and was ultimately referred to Dr. Laneta SimmersBartle for consideration of CABG.  Repeat echo was concerning for low flow severe AS.  He was felt to be a candidate for surgery.   He was admitted 9/15-9/27 and underwent CABG + bioprosthetic AVR (L-LAD, S-OM1, S-PDA/OM3).  Post op course was notable for persistent sternal bleeding required exploration of the mediastinum and ligation of a sternal bleeding site.  He was in AFib with rapid rate and was placed on Amiodarone but this was DC'd when rate was controlled.  He had an increase in his creatinine but this improved prior to DC. He was placed on Coumadin and it was felt that transition back to Pradaxa could be considered 3 mos post CABG+AVR.  He was DC to SNF.    Readmitted 10/12-10/13 with chest discomfort concerning for unstable angina. INR was subtherapeutic and he was covered with IV heparin. Cardiac enzymes remained negative. Isosorbide was increased from 30 mg to 60 mg daily.  Returns for FU.  Here today with his friend and his son. Overall, doing well. He  is working with home health physical therapy. Chest soreness is improving. He denies significant dyspnea. He denies orthopnea or PND. Denies syncope. He does have some difficulty with his balance. He has not fallen. He is eating well. He denies any bleeding issues.   Studies/Reports Reviewed Today:  Carotid US 10/21/14 R 60-79%, L 40-59%  Echo 10/16/14 Mild LVH, EF 45%, diff HK, GLS - 11.4%, abnormal strain, mild AS by mean gradient but mod to severe by AVA, mild dilated aortic root (38 mm), MAC, trivial MR, severe LAE,mod RAE, PASP 46 mmHg  LHC 07/29/14 LAD: Proximal 80%, mid 80% LCx: Distal 40%, OM1 90%, OM3 70% RCA: Proximal 60%, mid 95% distal occluded (L-R collaterals) EF 45-50% FINAL CONCLUSIONS:  SEVERE MULTIVESSEL CAD WITH DIFFUSE PROXIMAL LAD STENOSIS, SEVERE OM1 STENOSIS, AND TOTAL OCCLUSION OF THE RCA WITH LEFT-TO-RIGHT COLLATERALS  MILD SEGMENTAL LV SYSTOLIC DYSFUNCTION WITH NORMAL LVEDP  RECOMMENDATIONS:  DIFFICULT SITUATION IN THIS ELDERLY GENTLEMAN WITH PROGRESSIVE DECLINE IN FUNCTIONAL CAPACITY, PRIMARILY RELATED TO WORSENING SHORTNESS OF BREATH. IN MY OPINION HIS CORONARY DISEASE WOULD CLEARLY BE BEST TREATED WITH CABG (MULTIVESSEL DIFFUSE DISEASE, DIABETES, LV SYSTOLIC DYSFUNCTION). HOWEVER, I AM CONCERNED ABOUT HIS RISK OF CABG AT AGE 42. PCI +/- MEDICAL THERAPY IS ANOTHER OPTION BUT BECAUSE OF RCA TOTAL OCCLUSION, DIFFUSE LAD STENOSIS EXTENDING BACK TO THE OSTIUM, NEED FOR CHRONIC ANTICOAGULATION, THIS APPROACH ALSO HAS SIGNIFICANT LIMITATIONS.   WILL ADMIT, CYCLE ENZYMES AS THE PATIENT HAD RESTING CHEST PAIN  TODAY, AND REVIEW FILMS/CLINICAL SCENARIO WITH COLLEAGUES  DISCUSSED AT LENGTH WITH PATIENT AND FAMILY      Myoview 3/14 Normal stress nuclear study.     Past Medical History  Diagnosis Date  . Hypertension   . Hypercholesterolemia   . Chronic back pain   . Hx of cardiovascular stress test     Lex MV 3/14:  Not gated, apical thinning, no ischemia.     Marland Kitchen Hx of echocardiogram     Echo 04/24/12: mild LVH, EF 50-55%, mild AS (mean 9), MAC, mild MR, mod-severe BAE, mod TR, PASP 39  . Acute on chronic diastolic CHF (congestive heart failure), NYHA class 3 (HCC) 07/29/2014  . TIA (transient ischemic attack) 06/2007    "had 2-3 little ones around that time"  . Carotid artery stenosis   . Chronic atrial fibrillation (HCC)   . Type II diabetes mellitus (HCC)   . Osteoarthritis   . Arthritis     "back" (07/29/2014)  . Chronic lower back pain   . Exertional shortness of breath     chronic/notes 07/29/2014  . Pneumonia ~ 1951; 02/2014  . Dysrhythmia     afib  . Heart murmur   . Stroke (HCC)     ministroke- 2xs  . Anxiety     short periods of nervousness, reported since need for heart care, he feels anxious on & off.    Marland Kitchen COPD (chronic obstructive pulmonary disease) [facility])     Past Surgical History  Procedure Laterality Date  . Lumbar laminectomy/decompression microdiscectomy  1990's X 1; 2000's X1  . Knee arthroscopy Left   . Tonsillectomy    . Appendectomy    . Replacement total knee Bilateral ~ 2001-2002  . Total hip arthroplasty Left ~ 2003  . Joint replacement    . Cataract extraction w/ intraocular lens  implant, bilateral Bilateral   . Cardiac catheterization  07/29/2014  . Cardiac catheterization N/A 07/29/2014    Procedure: Left Heart Cath and Coronary Angiography;  Surgeon: [name], MD;  Location: [facility] INVASIVE CV LAB;  Service: Cardiovascular;  Laterality: N/A;  . Back surgery      x2   . Eye surgery      cataracts - removed- bilateral, /w IOL  . Coronary artery bypass graft N/A 10/29/2014    Procedure: CORONARY ARTERY BYPASS GRAFTING (CABG);  Surgeon: [name], MD;  Location: [facility] OR;  Service: Open Heart Surgery;  Laterality: N/A;  . Aortic valve replacement N/A 10/29/2014    Procedure: AORTIC VALVE REPLACEMENT (AVR);  Surgeon: [name], MD;  Location: [facility] OR;  Service: Open Heart Surgery;  Laterality: N/A;  . Tee  without cardioversion N/A 10/29/2014    Procedure: TRANSESOPHAGEAL ECHOCARDIOGRAM (TEE);  Surgeon: [name], MD;  Location: [facility] OR;  Service: Open Heart Surgery;  Laterality: N/A;  . Exploration post operative open heart N/A 10/29/2014    Procedure: EXPLORATION POST OPERATIVE OPEN HEART;  Surgeon: [name], MD;  Location: [facility] OR;  Service: Open Heart Surgery;  Laterality: N/A;     Current Outpatient Prescriptions  Medication Sig Dispense Refill  . aspirin EC 81 MG tablet Take 1 tablet (81 mg total) by mouth daily. 30 tablet 12  . diltiazem (CARDIZEM CD) 240 MG 24 hr capsule Take 1 capsule (240 mg total) by mouth daily. 30 capsule 6  . ferrous sulfate 325 (65 FE) MG tablet Take 1 tablet (325 mg total) by mouth daily with breakfast. For one month 30  tablet 1  . glimepiride (AMARYL) 2 MG tablet Take 2 mg by mouth daily with breakfast.    . Insulin Glargine (LANTUS SOLOSTAR) 100 UNIT/ML Solostar Pen Inject 20 Units into the skin daily at 10 pm.    . isosorbide mononitrate (IMDUR) 60 MG 24 hr tablet Take 1 tablet (60 mg total) by mouth daily. 30 tablet 6  . metFORMIN (GLUCOPHAGE-XR) 500 MG 24 hr tablet Take 2 tablets (1,000 mg total) by mouth 2 (two) times daily.    . metoprolol tartrate (LOPRESSOR) 25 MG tablet Take 1 tablet (25 mg total) by mouth 2 (two) times daily. 60 tablet 11  . NITROSTAT 0.4 MG SL tablet Place 0.4 mg under the tongue every 5 (five) minutes as needed for chest pain. Limit 3 tabs per day  2  . pravastatin (PRAVACHOL) 80 MG tablet Take 80 mg by mouth daily.    Marland Kitchen warfarin (COUMADIN) 5 MG tablet Take 5 mg by mouth daily.     No current facility-administered medications for this visit.    Allergies:   Review of patient's allergies indicates no known allergies.    Social History:   Social History   Social History  . Marital Status: Divorced    Spouse Name: N/A  . Number of Children: N/A  . Years of Education: N/A   Occupational History  . reired     from work  in [employer]   Social History Main Topics  . Smoking status: Former Smoker -- 1.00 packs/day for 5 years    Types: Cigarettes    Quit date: 08/20/1984  . Smokeless tobacco: Former Neurosurgeon    Types: Chew     Comment: "stopped smoking cigarettes in the 1970's; quit chewing ~ 2000"  . Alcohol Use: No  . Drug Use: No  . Sexual Activity: No   Other Topics Concern  . None   Social History Narrative     Family History:   Family History  Problem Relation Age of Onset  . Other      no family hx of coronary artery disease  . Stroke Father   . Healthy Mother   . Arthritis Mother   . Alzheimer's disease Mother       ROS:   Please see the history of present illness.   Review of Systems  HENT: Positive for hearing loss.   Cardiovascular: Positive for dyspnea on exertion.  Hematologic/Lymphatic: Bruises/bleeds easily.  All other systems reviewed and are negative.     PHYSICAL EXAM: VS:  BP 128/62 mmHg  Pulse 64  Ht 6\' 1"  (1.854 m)  Wt 198 lb (89.812 kg)  BMI 26.13 kg/m2  SpO2 92%    Wt Readings from Last 3 Encounters:  12/10/14 198 lb (89.812 kg)  12/09/14 198 lb (89.812 kg)  11/26/14 198 lb 6.6 oz (90 kg)     GEN: Well nourished, well developed, in no acute distress HEENT: normal Neck: no JVD, no masses Cardiac:  Normal S1/S2, irregularly irregular rhythm; no murmur ,  no rubs or gallops, no edema   Chest: Median sternotomy well healed without erythema or discharge Respiratory:  clear to auscultation bilaterally, no wheezing, rhonchi or rales. GI: soft, nontender, nondistended, + BS MS: no deformity or atrophy Skin: warm and dry  Neuro:  CNs II-XII intact, Strength and sensation are intact Psych: Normal affect   EKG:  EKG is ordered today.  It demonstrates:   Atrial fibrillation, HR 64, LAD, IVCD, no change from prior  Recent Labs: 02/12/2014: B Natriuretic Peptide 272.0* 07/23/2014: Pro B Natriuretic peptide (BNP) 411.0* 11/01/2014: ALT 37; Magnesium  1.7; TSH 1.840 11/26/2014: BUN 22*; Creatinine, Ser 1.34*; Hemoglobin 10.4*; Platelets 171; Potassium 4.1; Sodium 136    Lipid Panel    Component Value Date/Time   CHOL 105 11/26/2014 0146   TRIG 95 11/26/2014 0146   HDL 31* 11/26/2014 0146   CHOLHDL 3.4 11/26/2014 0146   VLDL 19 11/26/2014 0146   LDLCALC 55 11/26/2014 0146      ASSESSMENT AND PLAN:  1. CAD: Status post CABG.   Progressing slowly.  He is working with HHPT. If he can get to [facility] for cardiac rehab when his PT is completed, that would be ideal.  Otherwise, he should increase activity on his own.  Continue aspirin, nitrates, beta blocker, statin.  2. Aortic Stenosis: Status post bioprosthetic aVR. Follow-up echocardiogram will be arranged. Continue SBE prophylaxis.  3. Chronic Systolic CHF:  Volume appears stable. He is not currently taking diuretics.  4. CKD:  Recent creatinine 1.34.  5. Chronic Atrial Fibrillation:  Heart rate controlled. He's currently on Coumadin. Once he is 3 months post CABG + AVR, consider changing back to Pradaxa.  INR yesterday at primary care was 2.1. Goal is 2-2.5. I will have our Coumadin Clinic manage his INR but have his labs drawn at his PCPs office to make it easier on him as he lives 45 minutes away.  We will have his INR rechecked in 2 weeks and called to our office for dosing.  6. Carotid Stenosis:  Will need to repeat Carotid US in 6 mos.   7. HTN:   Controlled.   8. Hyperlipidemia:  Continue statin.   9. Diabetes Mellitus:  FU with PCP.      Medication Changes: Current medicines are reviewed at length with the patient today.  Concerns regarding medicines are as outlined above.  The following changes have been made:   Discontinued Medications   LANTUS SOLOSTAR 100 UNIT/ML SOLOSTAR PEN    Please use SS to administer Lantus (if needed). If glucose remains elevated and patient has better caloric intake, may gradually resume 20 units of Lantus in am   Modified Medications    No medications on file   New Prescriptions   No medications on file   Labs/ tests ordered today include:   Orders Placed This Encounter  Procedures  . EKG 12-Lead  . Echocardiogram     Disposition:    FU with Dr. [name] 4-6 weeks.     Signed, Brynda Rim, MHS 12/10/2014 5:27 PM    [facility] [address] [place], [place], [place]  [address] Phone: [phone]; Fax: [phone]

## 2014-12-10 ENCOUNTER — Encounter: Payer: Self-pay | Admitting: Physician Assistant

## 2014-12-10 ENCOUNTER — Ambulatory Visit (INDEPENDENT_AMBULATORY_CARE_PROVIDER_SITE_OTHER): Payer: Medicare Other | Admitting: Physician Assistant

## 2014-12-10 VITALS — BP 128/62 | HR 64 | Ht 73.0 in | Wt 198.0 lb

## 2014-12-10 DIAGNOSIS — I5022 Chronic systolic (congestive) heart failure: Secondary | ICD-10-CM

## 2014-12-10 DIAGNOSIS — N183 Chronic kidney disease, stage 3 (moderate): Secondary | ICD-10-CM

## 2014-12-10 DIAGNOSIS — E1122 Type 2 diabetes mellitus with diabetic chronic kidney disease: Secondary | ICD-10-CM

## 2014-12-10 DIAGNOSIS — I350 Nonrheumatic aortic (valve) stenosis: Secondary | ICD-10-CM

## 2014-12-10 DIAGNOSIS — I2510 Atherosclerotic heart disease of native coronary artery without angina pectoris: Secondary | ICD-10-CM

## 2014-12-10 DIAGNOSIS — Z952 Presence of prosthetic heart valve: Secondary | ICD-10-CM

## 2014-12-10 DIAGNOSIS — I10 Essential (primary) hypertension: Secondary | ICD-10-CM

## 2014-12-10 DIAGNOSIS — I200 Unstable angina: Secondary | ICD-10-CM | POA: Diagnosis not present

## 2014-12-10 DIAGNOSIS — E785 Hyperlipidemia, unspecified: Secondary | ICD-10-CM

## 2014-12-10 DIAGNOSIS — Z951 Presence of aortocoronary bypass graft: Secondary | ICD-10-CM | POA: Diagnosis not present

## 2014-12-10 DIAGNOSIS — I482 Chronic atrial fibrillation, unspecified: Secondary | ICD-10-CM

## 2014-12-10 DIAGNOSIS — Z954 Presence of other heart-valve replacement: Secondary | ICD-10-CM

## 2014-12-10 DIAGNOSIS — I6523 Occlusion and stenosis of bilateral carotid arteries: Secondary | ICD-10-CM

## 2014-12-10 NOTE — Patient Instructions (Signed)
Medication Instructions:  1. Your physician recommends that you continue on your current medications as directed. Please refer to the Current Medication list given to you today.   Labwork: NONE  Testing/Procedures: Your physician has requested that you have an echocardiogram. Echocardiography is a painless test that uses sound waves to create images of your heart. It provides your doctor with information about the size and shape of your heart and how well your heart's chambers and valves are working. This procedure takes approximately one hour. There are no restrictions for this procedure.   Follow-Up: 4-6 WEEKS WITH DR. Excell SeltzerOOPER  Any Other Special Instructions Will Be Listed Below (If Applicable). HAVE YOUR PRIMARY CARE CHECK YOUR COUMADIN LEVEL 2 WEEKS AND CALL OUR COUMADIN CLINIC [phone] FOR DOSING    If you need a refill on your cardiac medications before your next appointment, please call your pharmacy.

## 2014-12-15 LAB — POCT INR: INR: 1.9

## 2014-12-16 ENCOUNTER — Ambulatory Visit (INDEPENDENT_AMBULATORY_CARE_PROVIDER_SITE_OTHER): Payer: Medicare Other | Admitting: Internal Medicine

## 2014-12-16 DIAGNOSIS — I4891 Unspecified atrial fibrillation: Secondary | ICD-10-CM

## 2014-12-16 DIAGNOSIS — I350 Nonrheumatic aortic (valve) stenosis: Secondary | ICD-10-CM

## 2014-12-16 DIAGNOSIS — I358 Other nonrheumatic aortic valve disorders: Secondary | ICD-10-CM | POA: Insufficient documentation

## 2014-12-16 DIAGNOSIS — I272 Other secondary pulmonary hypertension: Secondary | ICD-10-CM

## 2014-12-16 DIAGNOSIS — Z952 Presence of prosthetic heart valve: Secondary | ICD-10-CM | POA: Insufficient documentation

## 2014-12-16 DIAGNOSIS — Z954 Presence of other heart-valve replacement: Secondary | ICD-10-CM

## 2014-12-17 ENCOUNTER — Inpatient Hospital Stay (HOSPITAL_COMMUNITY): Admission: RE | Admit: 2014-12-17 | Payer: Medicare Other | Source: Ambulatory Visit

## 2014-12-20 ENCOUNTER — Other Ambulatory Visit: Payer: Self-pay | Admitting: Internal Medicine

## 2014-12-21 ENCOUNTER — Other Ambulatory Visit: Payer: Self-pay | Admitting: Internal Medicine

## 2014-12-21 ENCOUNTER — Ambulatory Visit (HOSPITAL_COMMUNITY): Payer: Medicare Other | Attending: Physician Assistant

## 2014-12-21 ENCOUNTER — Other Ambulatory Visit: Payer: Self-pay

## 2014-12-21 ENCOUNTER — Other Ambulatory Visit: Payer: Self-pay | Admitting: Cardiovascular Disease

## 2014-12-21 DIAGNOSIS — J90 Pleural effusion, not elsewhere classified: Secondary | ICD-10-CM | POA: Insufficient documentation

## 2014-12-21 DIAGNOSIS — I340 Nonrheumatic mitral (valve) insufficiency: Secondary | ICD-10-CM | POA: Insufficient documentation

## 2014-12-21 DIAGNOSIS — Z952 Presence of prosthetic heart valve: Secondary | ICD-10-CM

## 2014-12-21 DIAGNOSIS — I77810 Thoracic aortic ectasia: Secondary | ICD-10-CM | POA: Diagnosis not present

## 2014-12-21 DIAGNOSIS — I517 Cardiomegaly: Secondary | ICD-10-CM | POA: Diagnosis not present

## 2014-12-21 DIAGNOSIS — I4891 Unspecified atrial fibrillation: Secondary | ICD-10-CM | POA: Diagnosis not present

## 2014-12-21 DIAGNOSIS — I272 Other secondary pulmonary hypertension: Secondary | ICD-10-CM | POA: Diagnosis not present

## 2014-12-21 DIAGNOSIS — Z954 Presence of other heart-valve replacement: Secondary | ICD-10-CM | POA: Insufficient documentation

## 2014-12-21 DIAGNOSIS — I350 Nonrheumatic aortic (valve) stenosis: Secondary | ICD-10-CM

## 2014-12-22 ENCOUNTER — Encounter: Payer: Self-pay | Admitting: Physician Assistant

## 2014-12-22 ENCOUNTER — Telehealth: Payer: Self-pay | Admitting: *Deleted

## 2014-12-22 ENCOUNTER — Telehealth: Payer: Self-pay | Admitting: Pharmacist

## 2014-12-22 NOTE — Telephone Encounter (Signed)
-----   Message from Alleen BorneBryan K Bartle, MD sent at 12/22/2014 11:16 AM EST ----- He is on coumadin for atrial fib. He has a tissue aortic valve so he does not need coumadin for that. I would use the 2-3 range for him. ----- Message -----    From: Awilda MetroMegan E Myranda Pavone, [facility]    Sent: 12/16/2014   4:49 PM      To: Alleen BorneBryan K Bartle, MD  Dr. Laneta SimmersBartle,  I wanted to clarify Mr. Frank Marks' INR range for management in our Coumadin Clinic. Usual INR range for AVR patients is 2-3, I could not find any indication for using lower 2-2.5 range and was hoping you could clarify.  Thanks, Margaretmary DysMegan Kalib Bhagat, PharmD

## 2014-12-22 NOTE — Telephone Encounter (Signed)
Pt notified of echo results and findings. Pt denies any breathing problems when laying flat or coughing. He states he is doing fine. I advised pt if coughing or increased sob before Dr. Excell Seltzerooper appt 11/28 to call office so we can CXR, pt said ok.

## 2014-12-22 NOTE — Telephone Encounter (Signed)
Will update INR range with next Coumadin visit - INR checks with [facility] next scheduled 11/9.

## 2014-12-23 ENCOUNTER — Ambulatory Visit (INDEPENDENT_AMBULATORY_CARE_PROVIDER_SITE_OTHER): Payer: Medicare Other | Admitting: Interventional Cardiology

## 2014-12-23 DIAGNOSIS — I350 Nonrheumatic aortic (valve) stenosis: Secondary | ICD-10-CM

## 2014-12-23 DIAGNOSIS — Z952 Presence of prosthetic heart valve: Secondary | ICD-10-CM

## 2014-12-23 DIAGNOSIS — Z954 Presence of other heart-valve replacement: Secondary | ICD-10-CM

## 2014-12-23 DIAGNOSIS — I272 Other secondary pulmonary hypertension: Secondary | ICD-10-CM

## 2014-12-23 DIAGNOSIS — I4891 Unspecified atrial fibrillation: Secondary | ICD-10-CM

## 2014-12-23 LAB — POCT INR: INR: 2

## 2014-12-28 ENCOUNTER — Other Ambulatory Visit: Payer: Self-pay | Admitting: Internal Medicine

## 2014-12-30 ENCOUNTER — Ambulatory Visit (INDEPENDENT_AMBULATORY_CARE_PROVIDER_SITE_OTHER): Payer: Medicare Other | Admitting: Cardiovascular Disease

## 2014-12-30 DIAGNOSIS — Z952 Presence of prosthetic heart valve: Secondary | ICD-10-CM

## 2014-12-30 DIAGNOSIS — I350 Nonrheumatic aortic (valve) stenosis: Secondary | ICD-10-CM

## 2014-12-30 DIAGNOSIS — Z954 Presence of other heart-valve replacement: Secondary | ICD-10-CM

## 2014-12-30 DIAGNOSIS — I272 Other secondary pulmonary hypertension: Secondary | ICD-10-CM

## 2014-12-30 DIAGNOSIS — I4891 Unspecified atrial fibrillation: Secondary | ICD-10-CM

## 2014-12-30 LAB — POCT INR: INR: 2

## 2015-01-02 ENCOUNTER — Other Ambulatory Visit: Payer: Self-pay | Admitting: Internal Medicine

## 2015-01-04 ENCOUNTER — Other Ambulatory Visit: Payer: Self-pay | Admitting: Internal Medicine

## 2015-01-07 ENCOUNTER — Other Ambulatory Visit: Payer: Self-pay | Admitting: Internal Medicine

## 2015-01-11 ENCOUNTER — Ambulatory Visit (INDEPENDENT_AMBULATORY_CARE_PROVIDER_SITE_OTHER): Payer: Medicare Other | Admitting: Cardiovascular Disease

## 2015-01-11 ENCOUNTER — Encounter: Payer: Self-pay | Admitting: Cardiovascular Disease

## 2015-01-11 VITALS — BP 132/76 | HR 82 | Ht 73.0 in | Wt 196.0 lb

## 2015-01-11 DIAGNOSIS — I2510 Atherosclerotic heart disease of native coronary artery without angina pectoris: Secondary | ICD-10-CM

## 2015-01-11 DIAGNOSIS — I482 Chronic atrial fibrillation, unspecified: Secondary | ICD-10-CM

## 2015-01-11 DIAGNOSIS — I200 Unstable angina: Secondary | ICD-10-CM

## 2015-01-11 MED ORDER — ISOSORBIDE MONONITRATE ER 60 MG PO TB24: 60.0000 mg | ORAL_TABLET | Freq: Every day | ORAL | Status: DC

## 2015-01-11 NOTE — Progress Notes (Signed)
Cardiology Office Note Date:  01/11/2015   ID:  Frank Marks, DOB 1926-11-30, MRN [id number]  PCP:  Ailene RavelHAMRICK,MAURA L, MD  Cardiologist:  Tonny Bollmanooper, Kennet Mccort, MD    Chief Complaint  Patient presents with  . Shortness of Breath   History of Present Illness: Frank Marks is a 81 y.o. male who presents for  Follow-up of coronary artery disease, chronic atrial fibrillation, and chronic systolic heart failure.  The patient had progressive symptoms of angina and congestive heart failure and ultimately was treated with multivessel CABG and bioprosthetic aortic valve replacement in September 2016. He required reexploration of the mediastinum and ligation of the sternal bleeding site. He's had a slow postoperative recovery. He was seen by Tereso NewcomerScott Weaver October 27 and returns today for further follow-up.  The patient has been doing well over the past few weeks. He continues to slowly regain strength. He denies any chest pain. Exertional dyspnea persists but he has not had further progression of symptoms. He denies leg swelling, orthopnea, or PND. He was changed from Pradaxa to warfarin post-operatively and would like to change back if possible. It's not easy for him to get out for blood draws and he has done well on Pradaxa for many years preceding heart surgery.  Past Medical History  Diagnosis Date  . Hypertension   . Hypercholesterolemia   . Chronic back pain   . Hx of cardiovascular stress test     Lex MV 3/14:  Not gated, apical thinning, no ischemia.   Marland Kitchen. Hx of echocardiogram 2014    Echo 04/24/12: mild LVH, EF 50-55%, mild AS (mean 9), MAC, mild MR, mod-severe BAE, mod TR, PASP 39  . Acute on chronic diastolic CHF (congestive heart failure), NYHA class 3 (HCC) 07/29/2014  . TIA (transient ischemic attack) 06/2007    "had 2-3 little ones around that time"  . Carotid artery stenosis   . Chronic atrial fibrillation (HCC)   . Type II diabetes mellitus (HCC)   . Osteoarthritis   . Arthritis       "back" (07/29/2014)  . Chronic lower back pain   . Pneumonia ~ 1951; 02/2014  . Dysrhythmia     afib  . Stroke (HCC)     ministroke- 2xs  . Anxiety     short periods of nervousness, reported since need for heart care, he feels anxious on & off.    Marland Kitchen. COPD (chronic obstructive pulmonary disease) (HCC)   . Aortic stenosis     a. s/p bioprosthetic AVR 9/16.  b. Echo 11/16: Mild LVH, EF 50-55%, normal wall motion, indeterminate diastolic function, AVR okay with mean gradient 7 mmHg and no regurgitation, borderline dilated aortic root (38 mm), mildly dilated ascending aorta (40 mm), MAC, mild MR, severe LAE, mildly reduced RVSF, mild to moderate RAE, PASP 46 mmHg, left sided pleural effusion    Past Surgical History  Procedure Laterality Date  . Lumbar laminectomy/decompression microdiscectomy  1990's X 1; 2000's X1  . Knee arthroscopy Left   . Tonsillectomy    . Appendectomy    . Replacement total knee Bilateral ~ 2001-2002  . Total hip arthroplasty Left ~ 2003  . Joint replacement    . Cataract extraction w/ intraocular lens  implant, bilateral Bilateral   . Cardiac catheterization  07/29/2014  . Cardiac catheterization N/A 07/29/2014    Procedure: Left Heart Cath and Coronary Angiography;  Surgeon: Tonny BollmanMichael Ligaya Cormier, MD;  Location: [facility] INVASIVE CV LAB;  Service: Cardiovascular;  Laterality: N/A;  .  Back surgery      x2   . Eye surgery      cataracts - removed- bilateral, /w IOL  . Coronary artery bypass graft N/A 10/29/2014    Procedure: CORONARY ARTERY BYPASS GRAFTING (CABG);  Surgeon: [name], MD;  Location: [facility] OR;  Service: Open Heart Surgery;  Laterality: N/A;  . Aortic valve replacement N/A 10/29/2014    Procedure: AORTIC VALVE REPLACEMENT (AVR);  Surgeon: [name], MD;  Location: [facility] OR;  Service: Open Heart Surgery;  Laterality: N/A;  . Tee without cardioversion N/A 10/29/2014    Procedure: TRANSESOPHAGEAL ECHOCARDIOGRAM (TEE);  Surgeon: [name], MD;  Location: [facility]  OR;  Service: Open Heart Surgery;  Laterality: N/A;  . Exploration post operative open heart N/A 10/29/2014    Procedure: EXPLORATION POST OPERATIVE OPEN HEART;  Surgeon: [name], MD;  Location: [facility] OR;  Service: Open Heart Surgery;  Laterality: N/A;    Current Outpatient Prescriptions  Medication Sig Dispense Refill  . aspirin EC 81 MG tablet Take 1 tablet (81 mg total) by mouth daily. 30 tablet 12  . diltiazem (CARDIZEM CD) 240 MG 24 hr capsule Take 1 capsule (240 mg total) by mouth daily. 30 capsule 6  . ferrous sulfate 325 (65 FE) MG tablet Take 1 tablet (325 mg total) by mouth daily with breakfast. For one month 30 tablet 1  . glimepiride (AMARYL) 2 MG tablet Take 2 mg by mouth daily with breakfast.    . Insulin Glargine (LANTUS SOLOSTAR) 100 UNIT/ML Solostar Pen Inject 20 Units into the skin daily at 10 pm.    . isosorbide mononitrate (IMDUR) 60 MG 24 hr tablet Take 1 tablet (60 mg total) by mouth daily. 30 tablet 6  . lisinopril (PRINIVIL,ZESTRIL) 20 MG tablet Take 20 mg by mouth daily.  11  . metFORMIN (GLUCOPHAGE-XR) 500 MG 24 hr tablet Take 2 tablets (1,000 mg total) by mouth 2 (two) times daily.    . metoprolol tartrate (LOPRESSOR) 25 MG tablet Take 1 tablet (25 mg total) by mouth 2 (two) times daily. 60 tablet 11  . NITROSTAT 0.4 MG SL tablet Place 0.4 mg under the tongue every 5 (five) minutes as needed for chest pain. Limit 3 tabs per day  2  . pravastatin (PRAVACHOL) 80 MG tablet Take 80 mg by mouth daily.    Marland Kitchen warfarin (COUMADIN) 5 MG tablet Take 1 tablet (5 mg total) by mouth as directed. 30 tablet 2   No current facility-administered medications for this visit.    Allergies:   Review of patient's allergies indicates no known allergies.   Social History:  The patient  reports that he quit smoking about 30 years ago. His smoking use included Cigarettes. He has a 5 pack-year smoking history. He has quit using smokeless tobacco. His smokeless tobacco use included Chew. He  reports that he does not drink alcohol or use illicit drugs.   Family History:  The patient's  family history includes Alzheimer's disease in his mother; Arthritis in his mother; Healthy in his mother; Other in an other family member; Stroke in his father.    ROS:  Please see the history of present illness.  Otherwise, review of systems is positive for  Hearing loss, back pain, easy bruising, irregular heartbeats.  All other systems are reviewed and negative.    PHYSICAL EXAM: VS:  BP 132/76 mmHg  Pulse 82  Ht 6\' 1"  (1.854 m)  Wt 196 lb (88.905 kg)  BMI  25.86 kg/m2  SpO2 92% , BMI Body mass index is 25.86 kg/(m^2). GEN: Well nourished, well developed, in no acute distress HEENT: normal Neck: no JVD, no masses. No carotid bruits Cardiac: RRR without murmur or gallop                Respiratory:  clear to auscultation bilaterally, normal work of breathing GI: soft, nontender, nondistended, + BS MS: no deformity or atrophy Ext: no pretibial edema Skin: warm and dry, no rash Neuro:  Strength and sensation are intact Psych: euthymic mood, full affect  EKG:  EKG is ordered today.  Recent Labs: 02/12/2014: B Natriuretic Peptide 272.0* 07/23/2014: Pro B Natriuretic peptide (BNP) 411.0* 11/01/2014: ALT 37; Magnesium 1.7; TSH 1.840 11/26/2014: BUN 22*; Creatinine, Ser 1.34*; Hemoglobin 10.4*; Platelets 171; Potassium 4.1; Sodium 136   Lipid Panel     Component Value Date/Time   CHOL 105 11/26/2014 0146   TRIG 95 11/26/2014 0146   HDL 31* 11/26/2014 0146   CHOLHDL 3.4 11/26/2014 0146   VLDL 19 11/26/2014 0146   LDLCALC 55 11/26/2014 0146   Wt Readings from Last 3 Encounters:  01/11/15 196 lb (88.905 kg)  12/10/14 198 lb (89.812 kg)  12/09/14 198 lb (89.812 kg)    Cardiac Studies Reviewed: 2D Echo 12/21/2014: Study Conclusions - Left ventricle: The cavity size was normal. Wall thickness was increased in a pattern of mild LVH. Systolic function was low normal to mildly  reduced. The estimated ejection fraction was in the range of 50% to 55%. Wall motion was normal; there were no regional wall motion abnormalities. Septal bounce consistent with prior cardiac surgery. Indeterminant diastolic function (atrial fibrillation). - Aortic valve: There was a bioprosthetic aortic valve that appeared to function normally. There was no significant regurgitation. Mean gradient (S): 7 mm Hg. - Aorta: Borderline dilated aortic root, mildly dilated ascending aorta. Aortic root diameter: 38 mm. Ascending aortic diameter: 40 mm (S). - Mitral valve: Moderately calcified annulus. There was mild regurgitation. - Left atrium: The atrium was severely dilated. - Right ventricle: The cavity size was normal. Systolic function was mildly reduced. - Right atrium: The atrium was mildly to moderately dilated. - Tricuspid valve: Peak RV-RA gradient (S): 31 mm Hg. - Pulmonary arteries: PA peak pressure: 46 mm Hg (S). - Systemic veins: IVC measured 2.3 cm with < 50% respirophasic variation, suggesting RA pressure 15 mmHg. - Pericardium, extracardiac: There was a left-sided pleural effusion.  Impressions:  - The patient was in atrial fibrillation. Normal LV size with mild LV hypertrophy. EF 50-55%, low normal to mildly reduced systolic function. Septal bounce consistent with prior cardiac surgery. Normal RV size with mildly decreased systolic function. Bioprosthetic aortic valve appeared to function normally. Mild pulmonary hypertension.  ASSESSMENT AND PLAN: 1.  CAD, native vessel, without angina:  The patient is doing well and continues with slow improvement following bypass surgery. He did have an episode of chest pain requiring observation in the hospital. He's had no further problems. There was no evidence of myonecrosis. He will continue on low-dose aspirin , metoprolol , and pravastatin.  2. Chronic atrial fibrillation: Appears stable. Will  change him back to Pradaxa at his request after tolerating it well for many years. He has undergone bioprosthetic aortic valve replacement, but his atrial fibrillation is clearly not valvular as he has had this for many years preceding hemodynamically significant aortic stenosis.  3.  Chronic systolic heart failure: Currently with New York Heart Association class II symptoms. He continues on  an ACE inhibitor , beta blocker, and long-acting nitrate. LVEF is improved postoperatively now estimated at 55%.    4. Type 2 diabetes: patient is treated with an  ACE inhibitor. Diabetes is followed by his primary care doctor.  5. Essential HTN, well-controlled BP. Continue same Rx.  6. Hyperlipidemia: continue statin drug.   Current medicines are reviewed with the patient today.  The patient does not have concerns regarding medicines.  Labs/ tests ordered today include:  No orders of the defined types were placed in this encounter.   Disposition:   FU 3 months  Signed, [name], MD  01/11/2015 3:51 PM    [facility] [address] [place], [place], [place]  [address] Phone: [phone]; Fax: [phone]

## 2015-01-11 NOTE — Patient Instructions (Signed)
Medication Instructions:  Your physician recommends that you continue on your current medications as directed. Please refer to the Current Medication list given to you today.  Please have another INR check around 01/27/15.  You will be given instructions by the coumadin clinic in regards to switching from Coumadin to Pradaxa.   Labwork: No new orders.   Testing/Procedures: No new orders.   Follow-Up: Your physician recommends that you schedule a follow-up appointment in: 3 MONTHS with Dr Excell Seltzerooper   Any Other Special Instructions Will Be Listed Below (If Applicable).     If you need a refill on your cardiac medications before your next appointment, please call your pharmacy.

## 2015-01-12 ENCOUNTER — Ambulatory Visit (INDEPENDENT_AMBULATORY_CARE_PROVIDER_SITE_OTHER): Payer: Medicare Other | Admitting: Interventional Cardiology

## 2015-01-12 DIAGNOSIS — Z952 Presence of prosthetic heart valve: Secondary | ICD-10-CM

## 2015-01-12 DIAGNOSIS — I350 Nonrheumatic aortic (valve) stenosis: Secondary | ICD-10-CM

## 2015-01-12 DIAGNOSIS — I4891 Unspecified atrial fibrillation: Secondary | ICD-10-CM

## 2015-01-12 DIAGNOSIS — I272 Other secondary pulmonary hypertension: Secondary | ICD-10-CM

## 2015-01-12 DIAGNOSIS — Z954 Presence of other heart-valve replacement: Secondary | ICD-10-CM

## 2015-01-12 LAB — POCT INR: INR: 2.4

## 2015-01-26 ENCOUNTER — Telehealth: Payer: Self-pay | Admitting: Cardiovascular Disease

## 2015-01-26 NOTE — Telephone Encounter (Signed)
Spoke with Delayne RN with [facility] and gave her orders to recheck INR on 02/01/15 instead of on 02/02/15. Instructed her to call us from the home when she checks his INR so we can call give dosing instructions and make changes to his dose if needed.

## 2015-01-26 NOTE — Telephone Encounter (Signed)
New message      Pt is due to have pt/inr checked on the 20th.  Home health will be there on the 19th.  Can they check it then?

## 2015-01-30 ENCOUNTER — Other Ambulatory Visit: Payer: Self-pay | Admitting: Internal Medicine

## 2015-02-01 ENCOUNTER — Ambulatory Visit (INDEPENDENT_AMBULATORY_CARE_PROVIDER_SITE_OTHER): Payer: Medicare Other | Admitting: Cardiovascular Disease

## 2015-02-01 DIAGNOSIS — I272 Other secondary pulmonary hypertension: Secondary | ICD-10-CM

## 2015-02-01 DIAGNOSIS — Z954 Presence of other heart-valve replacement: Secondary | ICD-10-CM

## 2015-02-01 DIAGNOSIS — I4891 Unspecified atrial fibrillation: Secondary | ICD-10-CM

## 2015-02-01 DIAGNOSIS — Z952 Presence of prosthetic heart valve: Secondary | ICD-10-CM

## 2015-02-01 DIAGNOSIS — I350 Nonrheumatic aortic (valve) stenosis: Secondary | ICD-10-CM

## 2015-02-01 LAB — POCT INR: INR: 2.1

## 2015-02-01 MED ORDER — DABIGATRAN ETEXILATE MESYLATE 150 MG PO CAPS: 150.0000 mg | ORAL_CAPSULE | Freq: Two times a day (BID) | ORAL | Status: AC

## 2015-02-23 ENCOUNTER — Telehealth: Payer: Self-pay | Admitting: Cardiovascular Disease

## 2015-02-23 NOTE — Telephone Encounter (Signed)
New message  Pharmacy calling   filled praxada on 12/9   / coumadin was on  12/15   Is patient on this medication or    *STAT* If patient is at the pharmacy, call can be transferred to refill team.   1. Which medications need to be refilled? (please list name of each medication and dose if known) coumadin 5 mg   2. Which pharmacy/location (including street and city if local pharmacy) is medication to be sent to? [facility] [place]   3. Do they need a 30 day or 90 day supply? 30 days

## 2015-03-21 ENCOUNTER — Emergency Department (HOSPITAL_COMMUNITY)
Admission: EM | Admit: 2015-03-21 | Discharge: 2015-03-21 | Disposition: A | Discharge status: Home / Self Care | Payer: Medicare Other | Attending: Emergency Medicine | Admitting: Emergency Medicine

## 2015-03-21 ENCOUNTER — Encounter (HOSPITAL_COMMUNITY): Payer: Self-pay | Admitting: *Deleted

## 2015-03-21 ENCOUNTER — Emergency Department (HOSPITAL_COMMUNITY): Payer: Medicare Other

## 2015-03-21 DIAGNOSIS — X58XXXA Exposure to other specified factors, initial encounter: Secondary | ICD-10-CM | POA: Insufficient documentation

## 2015-03-21 DIAGNOSIS — Y998 Other external cause status: Secondary | ICD-10-CM | POA: Insufficient documentation

## 2015-03-21 DIAGNOSIS — Z7982 Long term (current) use of aspirin: Secondary | ICD-10-CM | POA: Insufficient documentation

## 2015-03-21 DIAGNOSIS — Z951 Presence of aortocoronary bypass graft: Secondary | ICD-10-CM | POA: Insufficient documentation

## 2015-03-21 DIAGNOSIS — F419 Anxiety disorder, unspecified: Secondary | ICD-10-CM | POA: Diagnosis not present

## 2015-03-21 DIAGNOSIS — I482 Chronic atrial fibrillation: Secondary | ICD-10-CM | POA: Insufficient documentation

## 2015-03-21 DIAGNOSIS — Z8701 Personal history of pneumonia (recurrent): Secondary | ICD-10-CM | POA: Diagnosis not present

## 2015-03-21 DIAGNOSIS — Z87891 Personal history of nicotine dependence: Secondary | ICD-10-CM | POA: Diagnosis not present

## 2015-03-21 DIAGNOSIS — Y9389 Activity, other specified: Secondary | ICD-10-CM | POA: Insufficient documentation

## 2015-03-21 DIAGNOSIS — Z79899 Other long term (current) drug therapy: Secondary | ICD-10-CM | POA: Insufficient documentation

## 2015-03-21 DIAGNOSIS — M1990 Unspecified osteoarthritis, unspecified site: Secondary | ICD-10-CM | POA: Insufficient documentation

## 2015-03-21 DIAGNOSIS — E119 Type 2 diabetes mellitus without complications: Secondary | ICD-10-CM | POA: Diagnosis not present

## 2015-03-21 DIAGNOSIS — E7800 Pure hypercholesterolemia, unspecified: Secondary | ICD-10-CM | POA: Diagnosis not present

## 2015-03-21 DIAGNOSIS — G8929 Other chronic pain: Secondary | ICD-10-CM | POA: Insufficient documentation

## 2015-03-21 DIAGNOSIS — Z794 Long term (current) use of insulin: Secondary | ICD-10-CM | POA: Insufficient documentation

## 2015-03-21 DIAGNOSIS — Y9289 Other specified places as the place of occurrence of the external cause: Secondary | ICD-10-CM | POA: Insufficient documentation

## 2015-03-21 DIAGNOSIS — I10 Essential (primary) hypertension: Secondary | ICD-10-CM | POA: Insufficient documentation

## 2015-03-21 DIAGNOSIS — I5033 Acute on chronic diastolic (congestive) heart failure: Secondary | ICD-10-CM | POA: Insufficient documentation

## 2015-03-21 DIAGNOSIS — Z9889 Other specified postprocedural states: Secondary | ICD-10-CM | POA: Insufficient documentation

## 2015-03-21 DIAGNOSIS — Z7984 Long term (current) use of oral hypoglycemic drugs: Secondary | ICD-10-CM | POA: Diagnosis not present

## 2015-03-21 DIAGNOSIS — S79912A Unspecified injury of left hip, initial encounter: Secondary | ICD-10-CM | POA: Diagnosis present

## 2015-03-21 DIAGNOSIS — S72122A Displaced fracture of lesser trochanter of left femur, initial encounter for closed fracture: Secondary | ICD-10-CM | POA: Diagnosis not present

## 2015-03-21 DIAGNOSIS — M25552 Pain in left hip: Secondary | ICD-10-CM

## 2015-03-21 DIAGNOSIS — Z8673 Personal history of transient ischemic attack (TIA), and cerebral infarction without residual deficits: Secondary | ICD-10-CM | POA: Insufficient documentation

## 2015-03-21 DIAGNOSIS — J449 Chronic obstructive pulmonary disease, unspecified: Secondary | ICD-10-CM | POA: Diagnosis not present

## 2015-03-21 DIAGNOSIS — T148XXA Other injury of unspecified body region, initial encounter: Secondary | ICD-10-CM

## 2015-03-21 MED ORDER — TRAMADOL HCL 50 MG PO TABS
50.0000 mg | ORAL_TABLET | Freq: Once | ORAL | Status: AC
  Administered 2015-03-21: 50 mg via ORAL
  Filled 2015-03-21: qty 1

## 2015-03-21 MED ORDER — TRAMADOL HCL 50 MG PO TABS: 50.0000 mg | ORAL_TABLET | Freq: Two times a day (BID) | ORAL | Status: DC | PRN

## 2015-03-21 MED ORDER — ACETAMINOPHEN 325 MG PO TABS
325.0000 mg | ORAL_TABLET | Freq: Once | ORAL | Status: AC
  Administered 2015-03-21: 325 mg via ORAL
  Filled 2015-03-21: qty 1

## 2015-03-21 MED ORDER — ACETAMINOPHEN 325 MG PO TABS: 650.0000 mg | ORAL_TABLET | Freq: Four times a day (QID) | ORAL | Status: DC | PRN

## 2015-03-21 NOTE — Care Management Note (Signed)
Case Management Note  Patient Details  Name: Frank Marks MRN: [id number] Date of Birth: 1926/06/02  Subjective/Objective:  82 y.o. M seen in the ED after a 1 week hx of progressively worsening L Hip Pain, exacerbated by "sitting in the recliner". Pain worsened on Saturday unrelieved by Tylenol. Denies falls. Lives alone. Hx of R and L TKA, and L THA by [name], MD in [place], [place]. Reports has had HHPT in the past through [facility] and believes he would benefit from sessions now to relieve "frozen hip"  Son [name] will be available at  [phone] to take calls from [facility] representative as pt wears a hearing aide and may not be able to hear well enough on the phone.                   Action/Plan: Referred to [facility] Avie Echevaria via phone, awaiting confirmation for HHPT. No further CM needs at this time.    Expected Discharge Date:                  Expected Discharge Plan:  Home w Home Health Services  In-House Referral:     Discharge planning Services  CM Consult  Post Acute Care Choice:    Choice offered to:  Patient, Adult Children [facility]) [phone])  DME Arranged:   (Has RW, BSC and Lift Chair from  past TKA L and R and L THA) DME Agency:     HH Arranged:  PT HH Agency:  [facility] (Used [facility] in past and would like to use them again)  Status of Service:  Completed, signed off  Medicare Important Message Given:    Date Medicare IM Given:    Medicare IM give by:    Date Additional Medicare IM Given:    Additional Medicare Important Message give by:     If discussed at Long Length of Stay Meetings, dates discussed:    Additional Comments:  Yvone Neu, RN 03/21/2015, 9:51 AM

## 2015-03-21 NOTE — ED Notes (Signed)
Pt returned from xray

## 2015-03-21 NOTE — ED Notes (Signed)
Pt states Left hip pain that radiates down to his left foot for 2 days.  No injury noted.  Pt states worse when lying down.  No other medical c/o at this time.  Pt A&O.

## 2015-03-21 NOTE — ED Provider Notes (Signed)
CSN: [id number]     Arrival date & time 03/21/15  4098 History   First MD Initiated Contact with Patient 03/21/15 0701     Chief Complaint  Patient presents with  . Hip Pain     (Consider location/radiation/quality/duration/timing/severity/associated sxs/prior Treatment) Patient is a 82 y.o. male presenting with hip pain.  Hip Pain This is a new problem. The current episode started more than 1 week ago. The problem occurs constantly. The problem has been gradually worsening. Pertinent negatives include no chest pain, no abdominal pain, no headaches and no shortness of breath. The symptoms are aggravated by walking (certain positions). He has tried acetaminophen for the symptoms. The treatment provided mild relief.    Past Medical History  Diagnosis Date  . Hypertension   . Hypercholesterolemia   . Chronic back pain   . Hx of cardiovascular stress test     Lex MV 3/14:  Not gated, apical thinning, no ischemia.   Marland Kitchen Hx of echocardiogram 2014    Echo 04/24/12: mild LVH, EF 50-55%, mild AS (mean 9), MAC, mild MR, mod-severe BAE, mod TR, PASP 39  . Acute on chronic diastolic CHF (congestive heart failure), NYHA class 3 (HCC) 07/29/2014  . TIA (transient ischemic attack) 06/2007    "had 2-3 little ones around that time"  . Carotid artery stenosis   . Chronic atrial fibrillation (HCC)   . Type II diabetes mellitus (HCC)   . Osteoarthritis   . Arthritis     "back" (07/29/2014)  . Chronic lower back pain   . Pneumonia ~ 1951; 02/2014  . Dysrhythmia     afib  . Stroke (HCC)     ministroke- 2xs  . Anxiety     short periods of nervousness, reported since need for heart care, he feels anxious on & off.    Marland Kitchen COPD (chronic obstructive pulmonary disease) (HCC)   . Aortic stenosis     a. s/p bioprosthetic AVR 9/16.  b. Echo 11/16: Mild LVH, EF 50-55%, normal wall motion, indeterminate diastolic function, AVR okay with mean gradient 7 mmHg and no regurgitation, borderline dilated aortic root (38  mm), mildly dilated ascending aorta (40 mm), MAC, mild MR, severe LAE, mildly reduced RVSF, mild to moderate RAE, PASP 46 mmHg, left sided pleural effusion   Past Surgical History  Procedure Laterality Date  . Lumbar laminectomy/decompression microdiscectomy  1990's X 1; 2000's X1  . Knee arthroscopy Left   . Tonsillectomy    . Appendectomy    . Replacement total knee Bilateral ~ 2001-2002  . Total hip arthroplasty Left ~ 2003  . Joint replacement    . Cataract extraction w/ intraocular lens  implant, bilateral Bilateral   . Cardiac catheterization  07/29/2014  . Cardiac catheterization N/A 07/29/2014    Procedure: Left Heart Cath and Coronary Angiography;  Surgeon: [name], MD;  Location: [facility] INVASIVE CV LAB;  Service: Cardiovascular;  Laterality: N/A;  . Back surgery      x2   . Eye surgery      cataracts - removed- bilateral, /w IOL  . Coronary artery bypass graft N/A 10/29/2014    Procedure: CORONARY ARTERY BYPASS GRAFTING (CABG);  Surgeon: [name], MD;  Location: [facility] OR;  Service: Open Heart Surgery;  Laterality: N/A;  . Aortic valve replacement N/A 10/29/2014    Procedure: AORTIC VALVE REPLACEMENT (AVR);  Surgeon: [name], MD;  Location: [facility] OR;  Service: Open Heart Surgery;  Laterality: N/A;  . Tee without cardioversion  N/A 10/29/2014    Procedure: TRANSESOPHAGEAL ECHOCARDIOGRAM (TEE);  Surgeon: [name], MD;  Location: [facility] OR;  Service: Open Heart Surgery;  Laterality: N/A;  . Exploration post operative open heart N/A 10/29/2014    Procedure: EXPLORATION POST OPERATIVE OPEN HEART;  Surgeon: [name], MD;  Location: [facility] OR;  Service: Open Heart Surgery;  Laterality: N/A;   Family History  Problem Relation Age of Onset  . Other      no family hx of coronary artery disease  . Stroke Father   . Healthy Mother   . Arthritis Mother   . Alzheimer's disease Mother    Social History  Substance Use Topics  . Smoking status: Former Smoker -- 1.00 packs/day  for 5 years    Types: Cigarettes    Quit date: 08/20/1984  . Smokeless tobacco: Former Neurosurgeon    Types: Chew     Comment: "stopped smoking cigarettes in the 1970's; quit chewing ~ 2000"  . Alcohol Use: No    Review of Systems  Constitutional: Negative for fever, chills and activity change.  HENT: Negative for congestion and rhinorrhea.   Eyes: Negative for visual disturbance.  Respiratory: Negative for cough and shortness of breath.   Cardiovascular: Negative for chest pain.  Gastrointestinal: Negative for vomiting, abdominal pain, diarrhea and constipation.  Endocrine: Negative for polyuria.  Genitourinary: Negative for dysuria, flank pain and genital sores.  Musculoskeletal: Negative for back pain, joint swelling, gait problem and neck pain.       Left hip pain  Skin: Negative for wound.  Neurological: Negative for seizures and headaches.  All other systems reviewed and are negative.     Allergies  Review of patient's allergies indicates no known allergies.  Home Medications   Prior to Admission medications   Medication Sig Start Date End Date Taking? Authorizing Provider  acetaminophen (TYLENOL) 325 MG tablet Take 2 tablets (650 mg total) by mouth every 6 (six) hours as needed. 03/21/15   Marily Memos, MD  aspirin EC 81 MG tablet Take 1 tablet (81 mg total) by mouth daily. 12/06/11   [name], MD  dabigatran (PRADAXA) 150 MG CAPS capsule Take 1 capsule (150 mg total) by mouth 2 (two) times daily. 02/01/15   [name], MD  diltiazem (CARDIZEM CD) 240 MG 24 hr capsule Take 1 capsule (240 mg total) by mouth daily. 07/23/14   [name], MD  ferrous sulfate 325 (65 FE) MG tablet Take 1 tablet (325 mg total) by mouth daily with breakfast. For one month 11/10/14   [name], PA-C  glimepiride (AMARYL) 2 MG tablet Take 2 mg by mouth daily with breakfast.    Historical Provider, MD  Insulin Glargine (LANTUS SOLOSTAR) 100 UNIT/ML Solostar Pen Inject 20 Units into  the skin daily at 10 pm.    Historical Provider, MD  isosorbide mononitrate (IMDUR) 60 MG 24 hr tablet Take 1 tablet (60 mg total) by mouth daily. 01/11/15   [name], MD  lisinopril (PRINIVIL,ZESTRIL) 20 MG tablet Take 20 mg by mouth daily. 01/04/15   Historical Provider, MD  metFORMIN (GLUCOPHAGE-XR) 500 MG 24 hr tablet Take 2 tablets (1,000 mg total) by mouth 2 (two) times daily. 08/01/14   Abelino Derrick, PA-C  metoprolol tartrate (LOPRESSOR) 25 MG tablet Take 1 tablet (25 mg total) by mouth 2 (two) times daily. 07/30/14   [name], PA-C  NITROSTAT 0.4 MG SL tablet Place 0.4 mg under the tongue every 5 (five) minutes  as needed for chest pain. Limit 3 tabs per day 09/04/14   Historical Provider, MD  pravastatin (PRAVACHOL) 80 MG tablet Take 80 mg by mouth daily.    Historical Provider, MD  traMADol (ULTRAM) 50 MG tablet Take 1 tablet (50 mg total) by mouth every 12 (twelve) hours as needed for severe pain. 03/21/15   Marily Memos, MD   BP 133/74 mmHg  Pulse 63  Temp(Src) 98.4 F (36.9 C) (Oral)  Resp 18  Ht  (1.854 m)  Wt 195 lb (88.451 kg)  BMI 25.73 kg/m2  SpO2 100% Physical Exam  Constitutional: He appears well-developed and well-nourished.  HENT:  Head: Normocephalic and atraumatic.  Neck: Normal range of motion.  Cardiovascular: Normal rate.   Pulmonary/Chest: Effort normal. No respiratory distress.  Abdominal: He exhibits no distension.  Musculoskeletal: Normal range of motion. He exhibits no edema or tenderness.  Left hip pain with bearing weight, but no difficulty ambulating, also gluteal muscles seem to be painful when patient has pROM quickly, but not slowly. No obvious spasming muscles on exam.   Neurological: He is alert.  Nursing note and vitals reviewed.   ED Course  Procedures (including critical care time) Labs Review Labs Reviewed - No data to display  Imaging Review Dg Pelvis 1-2 Views  03/21/2015  CLINICAL DATA:  Acute left hip pain without known  injury. EXAM: PELVIS - 1-2 VIEW COMPARISON:  None. FINDINGS: Status post left total hip arthroplasty. Degenerative disc disease is noted in the lower lumbar spine. Right hip is unremarkable. Possible mildly displaced avulsion fracture involving the lesser trochanter of the proximal left femur. IMPRESSION: Status post left total hip arthroplasty. Possible mildly displaced avulsion fracture involving the lesser trochanter of the proximal left femur. Electronically Signed   By: Lupita Raider, M.D.   On: 03/21/2015 08:50   Dg Tibia/fibula Left  03/21/2015  CLINICAL DATA:  Acute left hip pain without known injury. EXAM: LEFT TIBIA AND FIBULA - 2 VIEW COMPARISON:  None. FINDINGS: Status post left knee arthroplasty. Vascular calcifications are noted. No fracture or dislocation is noted. IMPRESSION: No acute abnormality seen in the left tibia or fibula. Electronically Signed   By: Lupita Raider, M.D.   On: 03/21/2015 08:46   Dg Femur Min 2 Views Left  03/21/2015  CLINICAL DATA:  LEFT hip pain the radiates to LEFT foot for 2 days EXAM: LEFT FEMUR 2 VIEWS COMPARISON:  None. FINDINGS: LEFT hip total arthroplasty noted. LEFT knee total arthroplasty noted. Small curvilinear fragment adjacent to the inferior trochanter. No additional evidence of fracture dislocation. Some heterotopic calcification adjacent to the greater trochanter. IMPRESSION: 1. Small avulsion fragment adjacent to the lesser trochanter is indeterminate in age. 2. No complication associated with the LEFT hip or knee arthroplasty. Electronically Signed   By: Genevive Bi M.D.   On: 03/21/2015 08:46   I have personally reviewed and evaluated these images and lab results as part of my medical decision-making.   EKG Interpretation None      MDM   Final diagnoses:  Left hip pain  Avulsion fracture   Doubt fracture without trauma, but 2/2 prosthesis and duration of symptoms, will get xr's. Will likely need conservative treatment with PCP  follow up and PT. Will avoid most medications for these symptoms as they are all sedating and he is fall risk at baseline 2/2 decreased mobility. Will rec NSAID, PT, massage, warm compresses and ROM.   XR w/ age indeterminate avulsion fracture of  the inside of his femur. Around the lesser trochanter. I think this is unlikely to be the source of his symptoms as his pain is mostly in the gluteal muscle area. Either way and avulsion fracture would not change management at this time. Care management has been consult to we are waiting for their help and the patient will be discharged on Ultram along with above-mentioned therapies.    Marily Memos, MD 03/21/15 [phone]

## 2015-03-21 NOTE — ED Notes (Signed)
MD at bedside. 

## 2015-03-22 ENCOUNTER — Telehealth: Payer: Self-pay | Admitting: Surgery

## 2015-03-22 NOTE — Telephone Encounter (Signed)
ED CM received call from patient's son Frank Marks regarding [facility] referral. Son states, he contacted [facility] who stated the referral was not received. Referral resent via fax received fax confirmation. Son instructed to contact [facility] tomorrow to follow -up on start of services. No further questions or concerns verbalized.

## 2015-03-23 ENCOUNTER — Ambulatory Visit: Payer: Medicare Other | Admitting: Pulmonary Disease

## 2015-03-23 ENCOUNTER — Telehealth: Payer: Self-pay | Admitting: *Deleted

## 2015-03-23 NOTE — Telephone Encounter (Signed)
ERCM placed referral to [facility] as [facility] was not able to accept referral.  Angela Adam of [facility] contacted and accepted referral.

## 2015-03-26 ENCOUNTER — Encounter (HOSPITAL_COMMUNITY): Payer: Self-pay | Admitting: Emergency Medicine

## 2015-03-26 ENCOUNTER — Emergency Department (HOSPITAL_COMMUNITY)
Admission: EM | Admit: 2015-03-26 | Discharge: 2015-03-27 | Disposition: A | Discharge status: Home / Self Care | Payer: Medicare Other | Attending: Emergency Medicine | Admitting: Emergency Medicine

## 2015-03-26 DIAGNOSIS — Z7982 Long term (current) use of aspirin: Secondary | ICD-10-CM | POA: Insufficient documentation

## 2015-03-26 DIAGNOSIS — Z8701 Personal history of pneumonia (recurrent): Secondary | ICD-10-CM | POA: Diagnosis not present

## 2015-03-26 DIAGNOSIS — Z7901 Long term (current) use of anticoagulants: Secondary | ICD-10-CM | POA: Insufficient documentation

## 2015-03-26 DIAGNOSIS — F419 Anxiety disorder, unspecified: Secondary | ICD-10-CM | POA: Diagnosis not present

## 2015-03-26 DIAGNOSIS — M1990 Unspecified osteoarthritis, unspecified site: Secondary | ICD-10-CM | POA: Diagnosis not present

## 2015-03-26 DIAGNOSIS — J449 Chronic obstructive pulmonary disease, unspecified: Secondary | ICD-10-CM | POA: Diagnosis not present

## 2015-03-26 DIAGNOSIS — I5033 Acute on chronic diastolic (congestive) heart failure: Secondary | ICD-10-CM | POA: Insufficient documentation

## 2015-03-26 DIAGNOSIS — G8929 Other chronic pain: Secondary | ICD-10-CM | POA: Diagnosis not present

## 2015-03-26 DIAGNOSIS — Z96642 Presence of left artificial hip joint: Secondary | ICD-10-CM | POA: Insufficient documentation

## 2015-03-26 DIAGNOSIS — Z79899 Other long term (current) drug therapy: Secondary | ICD-10-CM | POA: Insufficient documentation

## 2015-03-26 DIAGNOSIS — I10 Essential (primary) hypertension: Secondary | ICD-10-CM | POA: Insufficient documentation

## 2015-03-26 DIAGNOSIS — I482 Chronic atrial fibrillation: Secondary | ICD-10-CM | POA: Diagnosis not present

## 2015-03-26 DIAGNOSIS — Z8673 Personal history of transient ischemic attack (TIA), and cerebral infarction without residual deficits: Secondary | ICD-10-CM | POA: Insufficient documentation

## 2015-03-26 DIAGNOSIS — E119 Type 2 diabetes mellitus without complications: Secondary | ICD-10-CM | POA: Diagnosis not present

## 2015-03-26 DIAGNOSIS — E785 Hyperlipidemia, unspecified: Secondary | ICD-10-CM | POA: Diagnosis not present

## 2015-03-26 DIAGNOSIS — M25552 Pain in left hip: Secondary | ICD-10-CM | POA: Insufficient documentation

## 2015-03-26 DIAGNOSIS — Z794 Long term (current) use of insulin: Secondary | ICD-10-CM | POA: Diagnosis not present

## 2015-03-26 DIAGNOSIS — Z7984 Long term (current) use of oral hypoglycemic drugs: Secondary | ICD-10-CM | POA: Diagnosis not present

## 2015-03-26 DIAGNOSIS — Z87891 Personal history of nicotine dependence: Secondary | ICD-10-CM | POA: Insufficient documentation

## 2015-03-26 NOTE — ED Notes (Signed)
Patient arrives with complaint of left hip pain. States previous history of surgery to that hip with chronic hip pain for 10 years. Presents tonight because pain is worse.

## 2015-03-26 NOTE — ED Notes (Signed)
Per son, patient has been taking tramadol for his pain throughout this week. Today the pain medication hasn't been working.

## 2015-03-27 ENCOUNTER — Emergency Department (HOSPITAL_COMMUNITY): Payer: Medicare Other

## 2015-03-27 ENCOUNTER — Other Ambulatory Visit: Payer: Self-pay | Admitting: Cardiovascular Disease

## 2015-03-27 MED ORDER — HYDROCODONE-ACETAMINOPHEN 5-325 MG PO TABS
1.0000 | ORAL_TABLET | Freq: Once | ORAL | Status: AC
  Administered 2015-03-27: 1 via ORAL
  Filled 2015-03-27: qty 1

## 2015-03-27 MED ORDER — DOCUSATE SODIUM 100 MG PO CAPS: 100.0000 mg | ORAL_CAPSULE | Freq: Two times a day (BID) | ORAL | Status: AC

## 2015-03-27 MED ORDER — HYDROCODONE-ACETAMINOPHEN 5-325 MG PO TABS: 1.0000 | ORAL_TABLET | Freq: Four times a day (QID) | ORAL | Status: DC | PRN

## 2015-03-27 NOTE — ED Provider Notes (Signed)
By signing my name below, I, [name], attest that this documentation has been prepared under the direction and in the presence of [name], DO. Electronically Signed: [name], ED Scribe. 03/27/2015. 12:25 AM.   TIME SEEN: 12:15 AM  CHIEF COMPLAINT: Left hip pain  HPI:  Frank Marks is a 82 y.o. male with a hx of total left hip arthroplasty 10 years ago by Dr. [name] at [facility], hypertension, hyperlipidemia, TIA, chronic atrial fibrillation on Pradaxa who presents to the Emergency Department complaining of gradually worsening moderate constant left hip pain that intermittently radiates down left foot onset last night. Pt normally ambulates with a cane. He was seen here 5 days ago and told that his x-ray showed a small fracture to the lesser trochanter and given Tramadol. He states that he has been taking the Tramadol all week but the Tramadol has not been relieving his symptoms since last night. He denies any re-injuries or trauma.  No CP or SOB. Pt has upcoming physical therapy appointment in 3 days. Denies history of PE or DVT. No leg swelling.    ROS: See HPI Constitutional: no fever  Eyes: no drainage  ENT: no runny nose   Cardiovascular:  no chest pain  Resp: no SOB  GI: no vomiting GU: no dysuria Integumentary: no rash  Allergy: no hives  Musculoskeletal: no leg swelling  Neurological: no slurred speech ROS otherwise negative  PAST MEDICAL HISTORY/PAST SURGICAL HISTORY:  Past Medical History  Diagnosis Date  . Hypertension   . Hypercholesterolemia   . Chronic back pain   . Hx of cardiovascular stress test     Lex MV 3/14:  Not gated, apical thinning, no ischemia.   Marland Kitchen Hx of echocardiogram 2014    Echo 04/24/12: mild LVH, EF 50-55%, mild AS (mean 9), MAC, mild MR, mod-severe BAE, mod TR, PASP 39  . Acute on chronic diastolic CHF (congestive heart failure), NYHA class 3 (HCC) 07/29/2014  . TIA (transient ischemic attack) 06/2007    "had 2-3  little ones around that time"  . Carotid artery stenosis   . Chronic atrial fibrillation (HCC)   . Type II diabetes mellitus (HCC)   . Osteoarthritis   . Arthritis     "back" (07/29/2014)  . Chronic lower back pain   . Pneumonia ~ 1951; 02/2014  . Dysrhythmia     afib  . Stroke (HCC)     ministroke- 2xs  . Anxiety     short periods of nervousness, reported since need for heart care, he feels anxious on & off.    Marland Kitchen COPD (chronic obstructive pulmonary disease) (HCC)   . Aortic stenosis     a. s/p bioprosthetic AVR 9/16.  b. Echo 11/16: Mild LVH, EF 50-55%, normal wall motion, indeterminate diastolic function, AVR okay with mean gradient 7 mmHg and no regurgitation, borderline dilated aortic root (38 mm), mildly dilated ascending aorta (40 mm), MAC, mild MR, severe LAE, mildly reduced RVSF, mild to moderate RAE, PASP 46 mmHg, left sided pleural effusion    MEDICATIONS:  Prior to Admission medications   Medication Sig Start Date End Date Taking? Authorizing Provider  acetaminophen (TYLENOL) 325 MG tablet Take 2 tablets (650 mg total) by mouth every 6 (six) hours as needed. 03/21/15  Yes Marily Memos, MD  dabigatran (PRADAXA) 150 MG CAPS capsule Take 1 capsule (150 mg total) by mouth 2 (two) times daily. 02/01/15  Yes [name], MD  diltiazem (CARDIZEM CD) 240 MG  24 hr capsule Take 1 capsule (240 mg total) by mouth daily. 07/23/14  Yes [name], MD  ferrous sulfate 325 (65 FE) MG tablet Take 1 tablet (325 mg total) by mouth daily with breakfast. For one month 11/10/14  Yes [name], PA-C  folic acid (FOLVITE) 1 MG tablet Take 1 mg by mouth daily. 02/21/15  Yes Historical Provider, MD  glimepiride (AMARYL) 4 MG tablet Take 4 mg by mouth daily. 03/24/15  Yes Historical Provider, MD  Insulin Glargine (LANTUS SOLOSTAR) 100 UNIT/ML Solostar Pen Inject 20 Units into the skin daily at 10 pm.   Yes Historical Provider, MD  isosorbide mononitrate (IMDUR) 60 MG 24 hr tablet Take 1 tablet  (60 mg total) by mouth daily. 01/11/15  Yes [name], MD  lisinopril (PRINIVIL,ZESTRIL) 20 MG tablet Take 20 mg by mouth daily. 01/04/15  Yes Historical Provider, MD  metFORMIN (GLUCOPHAGE-XR) 500 MG 24 hr tablet Take 2 tablets (1,000 mg total) by mouth 2 (two) times daily. 08/01/14  Yes [name], PA-C  metoprolol tartrate (LOPRESSOR) 25 MG tablet Take 1 tablet (25 mg total) by mouth 2 (two) times daily. 07/30/14  Yes [name], PA-C  NITROSTAT 0.4 MG SL tablet Place 0.4 mg under the tongue every 5 (five) minutes as needed for chest pain. Limit 3 tabs per day 09/04/14  Yes Historical Provider, MD  pravastatin (PRAVACHOL) 80 MG tablet Take 80 mg by mouth daily.   Yes Historical Provider, MD  traMADol (ULTRAM) 50 MG tablet Take 1 tablet (50 mg total) by mouth every 12 (twelve) hours as needed for severe pain. 03/21/15  Yes Marily Memos, MD  aspirin EC 81 MG tablet Take 1 tablet (81 mg total) by mouth daily. Patient not taking: Reported on 03/26/2015 12/06/11   [name], MD    ALLERGIES:  No Known Allergies  SOCIAL HISTORY:  Social History  Substance Use Topics  . Smoking status: Former Smoker -- 1.00 packs/day for 5 years    Types: Cigarettes    Quit date: 08/20/1984  . Smokeless tobacco: Former Neurosurgeon    Types: Chew     Comment: "stopped smoking cigarettes in the 1970's; quit chewing ~ 2000"  . Alcohol Use: No    FAMILY HISTORY: Family History  Problem Relation Age of Onset  . Other      no family hx of coronary artery disease  . Stroke Father   . Healthy Mother   . Arthritis Mother   . Alzheimer's disease Mother     EXAM: BP 128/71 mmHg  Pulse 51  Temp(Src) 98.1 F (36.7 C) (Oral)  Resp 14  SpO2 100% CONSTITUTIONAL: Alert and oriented and responds appropriately to questions. Elderly; well-nourished, NAD HEAD: Normocephalic EYES: Conjunctivae clear, PERRL ENT: normal nose; no rhinorrhea; moist mucous membranes; pharynx without lesions noted NECK: Supple, no  meningismus, no LAD  CARD: RRR; S1 and S2 appreciated; no murmurs, no clicks, no rubs, no gallops RESP: Normal chest excursion without splinting or tachypnea; breath sounds clear and equal bilaterally; no wheezes, no rhonchi, no rales, no hypoxia or respiratory distress, speaking full sentences ABD/GI: Normal bowel sounds; non-distended; soft, non-tender, no rebound, no guarding, no peritoneal signs BACK:  The back appears normal and is non-tender to palpation, there is no CVA tenderness, no midline spinal tenderness or step-off or deformity EXT: No reproducible pain with palpation of the left hip or anywhere on the LLE, no bony deformity, able to lift leg off bed without any difficulty  or assistance, no joint effusion, no erythema or warmth, no rash, 2+ left DP pulse, normal ROM in all joints; otherwise extremities non-tender to palpation; no edema; normal capillary refill; no cyanosis, no calf tenderness or swelling    SKIN: Normal color for age and race; warm NEURO: Moves all extremities equally, sensation to light touch intact diffusely, cranial nerves II through XII intact PSYCH: The patient's mood and manner are appropriate. Grooming and personal hygiene are appropriate.  MEDICAL DECISION MAKING: Patient here with complaints of left hip pain. I'm not able to reproduce his pain with palpation and he has good range of motion in this joint. Son at bedside reports patient is able to ambulate using a cane. No new injury. No calf swelling or tenderness on exam. He is neurovascularly intact distally. We'll proceed with CT imaging to evaluate for any occult fracture not seen on x-ray. We'll give Vicodin for pain and reassess.  ED PROGRESS: Patient's CT scan shows no fracture. He does have an irregularity of the lesser trochanter but this appears chronic and less likely a acute fracture. I feel physical therapy is appropriate for this patient and it is set up to be started in the next several days at home.  Son feels comfortable taking patient home. No sign of septic arthritis on exam. No sign of DVT. Patient is ambulatory. I feel we can discharge him home with prescription for Vicodin for further pain control. I recommended close outpatient follow-up with her primary care provider. They verbalize understanding and are comfortable with this plan.     I personally performed the services described in this documentation, which was scribed in my presence. The recorded information has been reviewed and is accurate.   Kristen N Layla MawDO 03/27/15 [phone]

## 2015-03-27 NOTE — ED Notes (Signed)
Pt ambulated in hall with cain very well with no difficulty.

## 2015-03-27 NOTE — ED Notes (Signed)
Pt verbalized understanding of d/c instructions and has no further questions. Pt stable, able to ambulate well with cain and NAD. Pt d/c home with son driving.

## 2015-03-27 NOTE — Discharge Instructions (Signed)

## 2015-04-16 ENCOUNTER — Ambulatory Visit (INDEPENDENT_AMBULATORY_CARE_PROVIDER_SITE_OTHER): Payer: Medicare Other | Admitting: Cardiovascular Disease

## 2015-04-16 ENCOUNTER — Encounter: Payer: Self-pay | Admitting: Cardiovascular Disease

## 2015-04-16 VITALS — BP 124/80 | HR 72 | Ht 73.0 in | Wt 198.1 lb

## 2015-04-16 DIAGNOSIS — I25810 Atherosclerosis of coronary artery bypass graft(s) without angina pectoris: Secondary | ICD-10-CM | POA: Diagnosis not present

## 2015-04-16 DIAGNOSIS — I350 Nonrheumatic aortic (valve) stenosis: Secondary | ICD-10-CM

## 2015-04-16 NOTE — Patient Instructions (Signed)
Medication Instructions:  Your physician has recommended you make the following change in your medication:  1. STOP Isosorbide MN  Labwork: No new orders.   Testing/Procedures: No new orders.   Follow-Up: Your physician wants you to follow-up in: 6 MONTHS with Dr Cooper. You will receive a reminder letter in the mail two months in advance. If you don't receive a letter, please call our office to schedule the follow-up appointment.   Any Other Special Instructions Will Be Listed Below (If Applicable).     If you need a refill on your cardiac medications before your next appointment, please call your pharmacy.   

## 2015-04-16 NOTE — Progress Notes (Signed)
Cardiology Office Note Date:  04/16/2015   ID:  Frank Marks, DOB March 15, 1926, MRN [id number]  PCP:  Ailene RavelHAMRICK,MAURA L, MD  Cardiologist:  Tonny Bollmanooper, Atom Solivan, MD    Chief Complaint  Patient presents with  . Shortness of Breath    History of Present Illness: Frank Marks is a 82 y.o. male who presents for follow-up of coronary artery disease, chronic atrial fibrillation, and chronic systolic heart failure. The patient had progressive symptoms of angina and congestive heart failure and ultimately was treated with multivessel CABG and bioprosthetic aortic valve replacement in September 2016.  Frank Marks is here with his sone today. He is moving very slowly.  He's been noted to have some confusion recently. He denies chest pain. Shortness of breath with activity is unchanged. He has no resting shortness of breath , orthopnea, or PND. He's had mild swelling on the left side. He complains of dizziness when he first gets up.  No syncope. Oral hypoglycemic medicines have been recently adjusted.  Past Medical History  Diagnosis Date  . Hypertension   . Hypercholesterolemia   . Chronic back pain   . Hx of cardiovascular stress test     Lex MV 3/14:  Not gated, apical thinning, no ischemia.   Marland Kitchen. Hx of echocardiogram 2014    Echo 04/24/12: mild LVH, EF 50-55%, mild AS (mean 9), MAC, mild MR, mod-severe BAE, mod TR, PASP 39  . Acute on chronic diastolic CHF (congestive heart failure), NYHA class 3 (HCC) 07/29/2014  . TIA (transient ischemic attack) 06/2007    "had 2-3 little ones around that time"  . Carotid artery stenosis   . Chronic atrial fibrillation (HCC)   . Type II diabetes mellitus (HCC)   . Osteoarthritis   . Arthritis     "back" (07/29/2014)  . Chronic lower back pain   . Pneumonia ~ 1951; 02/2014  . Dysrhythmia     afib  . Stroke (HCC)     ministroke- 2xs  . Anxiety     short periods of nervousness, reported since need for heart care, he feels anxious on & off.    Marland Kitchen. COPD (chronic  obstructive pulmonary disease) (HCC)   . Aortic stenosis     a. s/p bioprosthetic AVR 9/16.  b. Echo 11/16: Mild LVH, EF 50-55%, normal wall motion, indeterminate diastolic function, AVR okay with mean gradient 7 mmHg and no regurgitation, borderline dilated aortic root (38 mm), mildly dilated ascending aorta (40 mm), MAC, mild MR, severe LAE, mildly reduced RVSF, mild to moderate RAE, PASP 46 mmHg, left sided pleural effusion    Past Surgical History  Procedure Laterality Date  . Lumbar laminectomy/decompression microdiscectomy  1990's X 1; 2000's X1  . Knee arthroscopy Left   . Tonsillectomy    . Appendectomy    . Replacement total knee Bilateral ~ 2001-2002  . Total hip arthroplasty Left ~ 2003  . Joint replacement    . Cataract extraction w/ intraocular lens  implant, bilateral Bilateral   . Cardiac catheterization  07/29/2014  . Cardiac catheterization N/A 07/29/2014    Procedure: Left Heart Cath and Coronary Angiography;  Surgeon: Tonny BollmanMichael Quantae Martel, MD;  [facility];  Service: Cardiovascular;  Laterality: N/A;  . Back surgery      x2   . Eye surgery      cataracts - removed- bilateral, /w IOL  . Coronary artery bypass graft N/A 10/29/2014    Procedure: CORONARY ARTERY BYPASS GRAFTING (CABG);  Surgeon: Payton DoughtyBryan K  Laneta Simmers, MD;  Location: [facility] OR;  Service: Open Heart Surgery;  Laterality: N/A;  . Aortic valve replacement N/A 10/29/2014    Procedure: AORTIC VALVE REPLACEMENT (AVR);  Surgeon: [name], MD;  Location: [facility] OR;  Service: Open Heart Surgery;  Laterality: N/A;  . Tee without cardioversion N/A 10/29/2014    Procedure: TRANSESOPHAGEAL ECHOCARDIOGRAM (TEE);  Surgeon: [name], MD;  Location: [facility] OR;  Service: Open Heart Surgery;  Laterality: N/A;  . Exploration post operative open heart N/A 10/29/2014    Procedure: EXPLORATION POST OPERATIVE OPEN HEART;  Surgeon: [name], MD;  Location: [facility] OR;  Service: Open Heart Surgery;  Laterality: N/A;    Current  Outpatient Prescriptions  Medication Sig Dispense Refill  . dabigatran (PRADAXA) 150 MG CAPS capsule Take 1 capsule (150 mg total) by mouth 2 (two) times daily. 60 capsule 3  . diltiazem (CARDIZEM CD) 240 MG 24 hr capsule TAKE 1 CAPSULE (240 MG TOTAL) BY MOUTH DAILY. 30 capsule 11  . docusate sodium (COLACE) 100 MG capsule Take 1 capsule (100 mg total) by mouth every 12 (twelve) hours. 60 capsule 0  . ferrous sulfate 325 (65 FE) MG tablet Take 1 tablet (325 mg total) by mouth daily with breakfast. For one month 30 tablet 1  . folic acid (FOLVITE) 1 MG tablet Take 1 mg by mouth daily.  3  . glimepiride (AMARYL) 4 MG tablet Take 4 mg by mouth daily.    . Insulin Glargine (LANTUS SOLOSTAR) 100 UNIT/ML Solostar Pen Inject 20 Units into the skin daily at 10 pm.    . lisinopril (PRINIVIL,ZESTRIL) 20 MG tablet Take 20 mg by mouth daily.  11  . metFORMIN (GLUCOPHAGE) 500 MG tablet Take 500 mg by mouth 2 (two) times daily with a meal.    . metoprolol tartrate (LOPRESSOR) 25 MG tablet Take 1 tablet (25 mg total) by mouth 2 (two) times daily. 60 tablet 11  . NITROSTAT 0.4 MG SL tablet Place 0.4 mg under the tongue every 5 (five) minutes as needed for chest pain. Limit 3 tabs per day  2  . pravastatin (PRAVACHOL) 80 MG tablet Take 80 mg by mouth daily.     No current facility-administered medications for this visit.    Allergies:   Review of patient's allergies indicates no known allergies.   Social History:  The patient  reports that he quit smoking about 30 years ago. His smoking use included Cigarettes. He has a 5 pack-year smoking history. He has quit using smokeless tobacco. His smokeless tobacco use included Chew. He reports that he does not drink alcohol or use illicit drugs.   Family History:  The patient's  family history includes Alzheimer's disease in his mother; Arthritis in his mother; Healthy in his mother; Stroke in his father.    ROS:  Please see the history of present illness.   Otherwise, review of systems is positive for gait instability.  All other systems are reviewed and negative.    PHYSICAL EXAM: VS:  BP 124/80 mmHg  Pulse 72  Ht 6\' 1"  (1.854 m)  Wt 89.867 kg (198 lb 1.9 oz)  BMI 26.14 kg/m2 , BMI Body mass index is 26.14 kg/(m^2). GEN: Pleasant elderly male, in no acute distress HEENT: normal Neck: no JVD, no masses. No carotid bruits Cardiac: irregularly irregular without murmur or gallop           Respiratory:  clear to auscultation bilaterally, normal work of breathing GI: soft, nontender, nondistended, +  BS MS: no deformity or atrophy Ext: 1+ edema left ankle, trace edema on the right Skin: warm and dry, no rash Neuro:  Strength and sensation are intact Psych: euthymic mood, full affect  EKG:  EKG is ordered today. The ekg ordered today shows  Atrial fibrillation 72 bpm , left axis deviation,  nonspecific intraventricular conduction delay.  Recent Labs: 07/23/2014: Pro B Natriuretic peptide (BNP) 411.0* 11/01/2014: ALT 37; Magnesium 1.7; TSH 1.840 11/26/2014: BUN 22*; Creatinine, Ser 1.34*; Hemoglobin 10.4*; Platelets 171; Potassium 4.1; Sodium 136   Lipid Panel     Component Value Date/Time   CHOL 105 11/26/2014 0146   TRIG 95 11/26/2014 0146   HDL 31* 11/26/2014 0146   CHOLHDL 3.4 11/26/2014 0146   VLDL 19 11/26/2014 0146   LDLCALC 55 11/26/2014 0146      Wt Readings from Last 3 Encounters:  04/16/15 89.867 kg (198 lb 1.9 oz)  03/21/15 88.451 kg (195 lb)  01/11/15 88.905 kg (196 lb)     Cardiac Studies Reviewed: 2D Echo 12/21/2014: Study Conclusions - Left ventricle: The cavity size was normal. Wall thickness was increased in a pattern of mild LVH. Systolic function was low normal to mildly reduced. The estimated ejection fraction was in the range of 50% to 55%. Wall motion was normal; there were no regional wall motion abnormalities. Septal bounce consistent with prior cardiac surgery. Indeterminant diastolic  function (atrial fibrillation). - Aortic valve: There was a bioprosthetic aortic valve that appeared to function normally. There was no significant regurgitation. Mean gradient (S): 7 mm Hg. - Aorta: Borderline dilated aortic root, mildly dilated ascending aorta. Aortic root diameter: 38 mm. Ascending aortic diameter: 40 mm (S). - Mitral valve: Moderately calcified annulus. There was mild regurgitation. - Left atrium: The atrium was severely dilated. - Right ventricle: The cavity size was normal. Systolic function was mildly reduced. - Right atrium: The atrium was mildly to moderately dilated. - Tricuspid valve: Peak RV-RA gradient (S): 31 mm Hg. - Pulmonary arteries: PA peak pressure: 46 mm Hg (S). - Systemic veins: IVC measured 2.3 cm with < 50% respirophasic variation, suggesting RA pressure 15 mmHg. - Pericardium, extracardiac: There was a left-sided pleural effusion.  Impressions:  - The patient was in atrial fibrillation. Normal LV size with mild LV hypertrophy. EF 50-55%, low normal to mildly reduced systolic function. Septal bounce consistent with prior cardiac surgery. Normal RV size with mildly decreased systolic function. Bioprosthetic aortic valve appeared to function normally. Mild pulmonary hypertension.  ASSESSMENT AND PLAN: 1. CAD, native vessel, without angina: The patient is doing well and continues with slow improvement following bypass surgery.  Will stop isosorbide as his possible this is contributing to his dizziness. Now that he is revascularized, I would not expect him to have angina especially at his low workload.  2. Chronic atrial fibrillation: Appears stable.  Tolerating Pradaxa without bleeding problems. Heart rate is controlled.  3. Chronic systolic heart failure: Currently with New York Heart Association class II symptoms. He continues on an ACE inhibitor , beta blocker. LVEF is improved postoperatively now estimated at  55%.   4. Type 2 diabetes: patient is treated with an ACE inhibitor. Diabetes is followed by his primary care doctor.  5. Essential HTN, well-controlled BP. Continue same Rx  Except for discontinuation of isosorbide  6. Hyperlipidemia: continue statin drug.   Current medicines are reviewed with the patient today.  The patient does not have concerns regarding medicines.  Labs/ tests ordered today include:  Orders Placed This Encounter  Procedures  . EKG 12-Lead    Disposition:   FU  6 months  Signed, [name], MD  04/16/2015 12:51 PM    [facility] [address] [place], [place], [place]  [address] Phone: [phone]; Fax: [phone]

## 2015-04-18 ENCOUNTER — Other Ambulatory Visit: Payer: Self-pay | Admitting: Internal Medicine

## 2015-04-21 ENCOUNTER — Other Ambulatory Visit: Payer: Self-pay | Admitting: Cardiovascular Disease

## 2015-04-21 DIAGNOSIS — I6523 Occlusion and stenosis of bilateral carotid arteries: Secondary | ICD-10-CM

## 2015-04-24 ENCOUNTER — Other Ambulatory Visit: Payer: Self-pay | Admitting: Internal Medicine

## 2015-04-27 ENCOUNTER — Inpatient Hospital Stay (HOSPITAL_COMMUNITY): Admission: RE | Admit: 2015-04-27 | Payer: Medicare Other | Source: Ambulatory Visit

## 2015-05-04 ENCOUNTER — Encounter (HOSPITAL_COMMUNITY): Payer: Medicare Other

## 2015-05-05 ENCOUNTER — Encounter (HOSPITAL_COMMUNITY): Payer: Medicare Other

## 2015-05-11 ENCOUNTER — Ambulatory Visit (HOSPITAL_COMMUNITY)
Admission: RE | Admit: 2015-05-11 | Discharge: 2015-05-11 | Disposition: A | Discharge status: Home / Self Care | Payer: Medicare Other | Source: Ambulatory Visit | Attending: Cardiovascular Disease | Admitting: Cardiovascular Disease

## 2015-05-11 DIAGNOSIS — I110 Hypertensive heart disease with heart failure: Secondary | ICD-10-CM | POA: Diagnosis not present

## 2015-05-11 DIAGNOSIS — I5032 Chronic diastolic (congestive) heart failure: Secondary | ICD-10-CM | POA: Insufficient documentation

## 2015-05-11 DIAGNOSIS — E119 Type 2 diabetes mellitus without complications: Secondary | ICD-10-CM | POA: Diagnosis not present

## 2015-05-11 DIAGNOSIS — E7800 Pure hypercholesterolemia, unspecified: Secondary | ICD-10-CM | POA: Insufficient documentation

## 2015-05-11 DIAGNOSIS — I6523 Occlusion and stenosis of bilateral carotid arteries: Secondary | ICD-10-CM | POA: Diagnosis not present

## 2015-05-12 ENCOUNTER — Inpatient Hospital Stay (HOSPITAL_COMMUNITY): Admission: RE | Admit: 2015-05-12 | Payer: Medicare Other | Source: Ambulatory Visit

## 2015-06-21 ENCOUNTER — Telehealth: Payer: Self-pay | Admitting: Cardiovascular Disease

## 2015-06-21 NOTE — Telephone Encounter (Signed)
Patient's son called to report SOB since Saturday night.  He st the patient called him Sunday morning complaining that he was up all night with SOB, dizziness, and nausea.  The son went to the patient's house Sunday morning after the phone call. He said the only symptom he had at that time was SOB. He st the SOB is constant, even while sitting. He st he tried to get his father to go to the ED but he adamantly refused.  Today, the patient wants to be seen be Dr. Excell Seltzerooper for evaluation. He refuses to go to the ED and refuses to see an APP.  Confirmed with patient's son the patient has no CP, swelling. He reports only SOB this AM.  He has no VS to report.  To Dr. Earmon Phoenixooper's nurse for appropriate follow-up.

## 2015-06-21 NOTE — Telephone Encounter (Signed)
I spoke with the pt's son and the pt saw his PCP after contacting our office.  The PCP heard abnormal lung sounds and has sent the pt for a chest x-ray at this time to rule out infection. I made [name] aware that I will contact him tomorrow to check on the pt and if the pt does have infection then he would not need to see Cardiology and should continue follow-up with PCP.

## 2015-06-21 NOTE — Telephone Encounter (Signed)
Per pt son Pt c/o Shortness Of Breath: STAT if SOB developed within the last 24 hours or pt is noticeably SOB on the phone  1. Are you currently SOB (can you hear that pt is SOB on the phone)? per son Yes!  2. How long have you been experiencing SOB? 1 DAY  3. Are you SOB when sitting or when up moving around?  BOTH   4. Are you currently experiencing any other symptoms? per son just spoke to him YES!

## 2015-06-22 ENCOUNTER — Emergency Department (HOSPITAL_COMMUNITY): Payer: Medicare Other

## 2015-06-22 ENCOUNTER — Encounter (HOSPITAL_COMMUNITY): Payer: Self-pay

## 2015-06-22 ENCOUNTER — Emergency Department (HOSPITAL_COMMUNITY)
Admission: EM | Admit: 2015-06-22 | Discharge: 2015-06-22 | Disposition: A | Discharge status: Home / Self Care | Payer: Medicare Other | Attending: Emergency Medicine | Admitting: Emergency Medicine

## 2015-06-22 DIAGNOSIS — Z951 Presence of aortocoronary bypass graft: Secondary | ICD-10-CM | POA: Insufficient documentation

## 2015-06-22 DIAGNOSIS — G8929 Other chronic pain: Secondary | ICD-10-CM | POA: Diagnosis not present

## 2015-06-22 DIAGNOSIS — J441 Chronic obstructive pulmonary disease with (acute) exacerbation: Secondary | ICD-10-CM | POA: Diagnosis not present

## 2015-06-22 DIAGNOSIS — E119 Type 2 diabetes mellitus without complications: Secondary | ICD-10-CM | POA: Insufficient documentation

## 2015-06-22 DIAGNOSIS — Z8673 Personal history of transient ischemic attack (TIA), and cerebral infarction without residual deficits: Secondary | ICD-10-CM | POA: Insufficient documentation

## 2015-06-22 DIAGNOSIS — Z8659 Personal history of other mental and behavioral disorders: Secondary | ICD-10-CM | POA: Diagnosis not present

## 2015-06-22 DIAGNOSIS — Z87891 Personal history of nicotine dependence: Secondary | ICD-10-CM | POA: Diagnosis not present

## 2015-06-22 DIAGNOSIS — Z8739 Personal history of other diseases of the musculoskeletal system and connective tissue: Secondary | ICD-10-CM | POA: Diagnosis not present

## 2015-06-22 DIAGNOSIS — Z79899 Other long term (current) drug therapy: Secondary | ICD-10-CM | POA: Diagnosis not present

## 2015-06-22 DIAGNOSIS — E7800 Pure hypercholesterolemia, unspecified: Secondary | ICD-10-CM | POA: Insufficient documentation

## 2015-06-22 DIAGNOSIS — Z7984 Long term (current) use of oral hypoglycemic drugs: Secondary | ICD-10-CM | POA: Insufficient documentation

## 2015-06-22 DIAGNOSIS — I5033 Acute on chronic diastolic (congestive) heart failure: Secondary | ICD-10-CM | POA: Diagnosis not present

## 2015-06-22 DIAGNOSIS — Z9889 Other specified postprocedural states: Secondary | ICD-10-CM | POA: Diagnosis not present

## 2015-06-22 DIAGNOSIS — Z8701 Personal history of pneumonia (recurrent): Secondary | ICD-10-CM | POA: Insufficient documentation

## 2015-06-22 DIAGNOSIS — J90 Pleural effusion, not elsewhere classified: Secondary | ICD-10-CM | POA: Diagnosis not present

## 2015-06-22 DIAGNOSIS — I10 Essential (primary) hypertension: Secondary | ICD-10-CM | POA: Insufficient documentation

## 2015-06-22 DIAGNOSIS — R0602 Shortness of breath: Secondary | ICD-10-CM | POA: Diagnosis present

## 2015-06-22 DIAGNOSIS — I482 Chronic atrial fibrillation: Secondary | ICD-10-CM | POA: Insufficient documentation

## 2015-06-22 DIAGNOSIS — Z794 Long term (current) use of insulin: Secondary | ICD-10-CM | POA: Diagnosis not present

## 2015-06-22 LAB — I-STAT TROPONIN, ED: TROPONIN I, POC: 0.02 ng/mL (ref 0.00–0.08)

## 2015-06-22 LAB — BODY FLUID CELL COUNT WITH DIFFERENTIAL
EOS FL: 1 %
LYMPHS FL: 91 %
Monocyte-Macrophage-Serous Fluid: 4 % — ABNORMAL LOW (ref 50–90)
NEUTROPHIL FLUID: 4 % (ref 0–25)
Total Nucleated Cell Count, Fluid: 2390 cu mm — ABNORMAL HIGH (ref 0–1000)

## 2015-06-22 LAB — APTT: aPTT: 76 seconds — ABNORMAL HIGH (ref 24–37)

## 2015-06-22 LAB — GRAM STAIN

## 2015-06-22 LAB — BASIC METABOLIC PANEL
Anion gap: 12 (ref 5–15)
BUN: 24 mg/dL — ABNORMAL HIGH (ref 6–20)
CALCIUM: 9.6 mg/dL (ref 8.9–10.3)
CHLORIDE: 103 mmol/L (ref 101–111)
CO2: 22 mmol/L (ref 22–32)
CREATININE: 1.22 mg/dL (ref 0.61–1.24)
GFR calc non Af Amer: 52 mL/min — ABNORMAL LOW (ref >60)
GLUCOSE: 257 mg/dL — AB (ref 65–99)
Potassium: 4 mmol/L (ref 3.5–5.1)
Sodium: 137 mmol/L (ref 135–145)

## 2015-06-22 LAB — TRIGLYCERIDES, BODY FLUIDS

## 2015-06-22 LAB — LACTATE DEHYDROGENASE, PLEURAL OR PERITONEAL FLUID: LD FL: 169 U/L — AB (ref 3–23)

## 2015-06-22 LAB — CBC
HEMATOCRIT: 32.1 % — AB (ref 39.0–52.0)
HEMOGLOBIN: 10.7 g/dL — AB (ref 13.0–17.0)
MCH: 28.8 pg (ref 26.0–34.0)
MCHC: 33.3 g/dL (ref 30.0–36.0)
MCV: 86.3 fL (ref 78.0–100.0)
Platelets: 229 10*3/uL (ref 150–400)
RBC: 3.72 MIL/uL — ABNORMAL LOW (ref 4.22–5.81)
RDW: 13.6 % (ref 11.5–15.5)
WBC: 7.2 10*3/uL (ref 4.0–10.5)

## 2015-06-22 LAB — PROTIME-INR
INR: 1.37 (ref 0.00–1.49)
PROTHROMBIN TIME: 17 s — AB (ref 11.6–15.2)

## 2015-06-22 LAB — GLUCOSE, SEROUS FLUID: GLUCOSE FL: 210 mg/dL

## 2015-06-22 LAB — PROTEIN, BODY FLUID: TOTAL PROTEIN, FLUID: 4.2 g/dL

## 2015-06-22 LAB — BRAIN NATRIURETIC PEPTIDE: B Natriuretic Peptide: 168.2 pg/mL — ABNORMAL HIGH (ref 0.0–100.0)

## 2015-06-22 MED ORDER — LIDOCAINE HCL (PF) 1 % IJ SOLN
INTRAMUSCULAR | Status: AC
  Filled 2015-06-22: qty 10

## 2015-06-22 NOTE — ED Notes (Signed)
Pt. Noted to have HR in 30's and 40's. EKG shot and EDP made aware.

## 2015-06-22 NOTE — Telephone Encounter (Signed)
F/u  Pt son following up from yesterday about pt's Xray in relation to SOB. Please call back and discuss.

## 2015-06-22 NOTE — ED Notes (Signed)
Pt. Transported back from [place] 

## 2015-06-22 NOTE — Telephone Encounter (Signed)
I spoke with [name] and made him aware that I just spoke with our medical records department and they have not received a fax in regards to this pt. Per Harvie Heckandy he has spoken with Dr Miguel RotaHamrick's office and they have faxed over information two times. [name] would like to know what needs to be done for the pt today.  The pt is SOB at rest and with activity.  I made him aware that if the pt is actively having these symptoms then he needs to proceed to the ER for evaluation.  Per Harvie Heckandy he does not want the pt to go to the ER and prefers that he be directly admitted.  I made him aware that I do not have any records of CXR findings and I do not know what was wrong on this test at this time.  At this time the pt should go to the ER. Per Harvie Heckandy he will speak with the pt but he would like to be contacted once we receive records.

## 2015-06-22 NOTE — Telephone Encounter (Signed)
[facility] due to SOB and moderate left pleural effusion on 06/21/15 CXR.

## 2015-06-22 NOTE — ED Provider Notes (Signed)
CSN: [id number]     Arrival date & time 06/22/15  1341 History   First MD Initiated Contact with Patient 06/22/15 1417     Chief Complaint  Patient presents with  . Shortness of Breath     (Consider location/radiation/quality/duration/timing/severity/associated sxs/prior Treatment) HPI Patient began developing shortness of breath 5 days ago. It has been incrementally worsening. The patient denies chest pain, cough, fever or chills. Shortness of breath is most pronounced with exertion but also noted at rest. Patient has had chronic atrial fibrillation and takes Pradaxa. He has not had any increased swelling of his lower extremities. Patient had contacted his primary care provider this morning and was sent for chest x-ray. Chest x-ray identified a moderate sized left pleural effusion. Patient was advised to come to the emergency department for treatment. Past Medical History  Diagnosis Date  . Hypertension   . Hypercholesterolemia   . Chronic back pain   . Hx of cardiovascular stress test     Lex MV 3/14:  Not gated, apical thinning, no ischemia.   Marland Kitchen. Hx of echocardiogram 2014    Echo 04/24/12: mild LVH, EF 50-55%, mild AS (mean 9), MAC, mild MR, mod-severe BAE, mod TR, PASP 39  . Acute on chronic diastolic CHF (congestive heart failure), NYHA class 3 (HCC) 07/29/2014  . TIA (transient ischemic attack) 06/2007    "had 2-3 little ones around that time"  . Carotid artery stenosis   . Chronic atrial fibrillation (HCC)   . Type II diabetes mellitus (HCC)   . Osteoarthritis   . Arthritis     "back" (07/29/2014)  . Chronic lower back pain   . Pneumonia ~ 1951; 02/2014  . Dysrhythmia     afib  . Stroke (HCC)     ministroke- 2xs  . Anxiety     short periods of nervousness, reported since need for heart care, he feels anxious on & off.    Marland Kitchen. COPD (chronic obstructive pulmonary disease) (HCC)   . Aortic stenosis     a. s/p bioprosthetic AVR 9/16.  b. Echo 11/16: Mild LVH, EF 50-55%, normal wall  motion, indeterminate diastolic function, AVR okay with mean gradient 7 mmHg and no regurgitation, borderline dilated aortic root (38 mm), mildly dilated ascending aorta (40 mm), MAC, mild MR, severe LAE, mildly reduced RVSF, mild to moderate RAE, PASP 46 mmHg, left sided pleural effusion   Past Surgical History  Procedure Laterality Date  . Lumbar laminectomy/decompression microdiscectomy  1990's X 1; 2000's X1  . Knee arthroscopy Left   . Tonsillectomy    . Appendectomy    . Replacement total knee Bilateral ~ 2001-2002  . Total hip arthroplasty Left ~ 2003  . Joint replacement    . Cataract extraction w/ intraocular lens  implant, bilateral Bilateral   . Cardiac catheterization  07/29/2014  . Cardiac catheterization N/A 07/29/2014    Procedure: Left Heart Cath and Coronary Angiography;  Surgeon: Tonny BollmanMichael Cooper, MD;  [facility];  Service: Cardiovascular;  Laterality: N/A;  . Back surgery      x2   . Eye surgery      cataracts - removed- bilateral, /w IOL  . Coronary artery bypass graft N/A 10/29/2014    Procedure: CORONARY ARTERY BYPASS GRAFTING (CABG);  Surgeon: Alleen BorneBryan K Bartle, MD;  [facility];  Service: Open Heart Surgery;  Laterality: N/A;  . Aortic valve replacement N/A 10/29/2014    Procedure: AORTIC VALVE REPLACEMENT (AVR);  Surgeon: Alleen BorneBryan K Bartle, MD;  Location: [facility] OR;  Service: Open Heart Surgery;  Laterality: N/A;  . Tee without cardioversion N/A 10/29/2014    Procedure: TRANSESOPHAGEAL ECHOCARDIOGRAM (TEE);  Surgeon: [name], MD;  Location: [facility] OR;  Service: Open Heart Surgery;  Laterality: N/A;  . Exploration post operative open heart N/A 10/29/2014    Procedure: EXPLORATION POST OPERATIVE OPEN HEART;  Surgeon: [name], MD;  Location: [facility] OR;  Service: Open Heart Surgery;  Laterality: N/A;   Family History  Problem Relation Age of Onset  . Other      no family hx of coronary artery disease  . Stroke Father   . Healthy Mother   . Arthritis  Mother   . Alzheimer's disease Mother    Social History  Substance Use Topics  . Smoking status: Former Smoker -- 1.00 packs/day for 5 years    Types: Cigarettes    Quit date: 08/20/1984  . Smokeless tobacco: Former Neurosurgeon    Types: Chew     Comment: "stopped smoking cigarettes in the 1970's; quit chewing ~ 2000"  . Alcohol Use: No    Review of Systems 10 Systems reviewed and are negative for acute change except as noted in the HPI.    Allergies  Review of patient's allergies indicates no known allergies.  Home Medications   Prior to Admission medications   Medication Sig Start Date End Date Taking? Authorizing Provider  dabigatran (PRADAXA) 150 MG CAPS capsule Take 1 capsule (150 mg total) by mouth 2 (two) times daily. 02/01/15  Yes [name], MD  diltiazem (CARDIZEM CD) 240 MG 24 hr capsule TAKE 1 CAPSULE (240 MG TOTAL) BY MOUTH DAILY. 03/29/15  Yes [name], MD  docusate sodium (COLACE) 100 MG capsule Take 1 capsule (100 mg total) by mouth every 12 (twelve) hours. 03/27/15  Yes [name], DO  ferrous sulfate 325 (65 FE) MG tablet Take 1 tablet (325 mg total) by mouth daily with breakfast. For one month 11/10/14  Yes [name], PA-C  folic acid (FOLVITE) 1 MG tablet Take 1 mg by mouth daily. 02/21/15  Yes Historical Provider, MD  glimepiride (AMARYL) 4 MG tablet Take 4 mg by mouth daily. 03/24/15  Yes Historical Provider, MD  Insulin Glargine (LANTUS SOLOSTAR) 100 UNIT/ML Solostar Pen Inject 20 Units into the skin daily at 10 pm.   Yes Historical Provider, MD  lisinopril (PRINIVIL,ZESTRIL) 20 MG tablet Take 20 mg by mouth daily. 01/04/15  Yes Historical Provider, MD  metFORMIN (GLUCOPHAGE) 500 MG tablet Take 500 mg by mouth 2 (two) times daily with a meal.   Yes Historical Provider, MD  metoprolol tartrate (LOPRESSOR) 25 MG tablet Take 1 tablet (25 mg total) by mouth 2 (two) times daily. 07/30/14  Yes [name], PA-C  NITROSTAT 0.4 MG SL tablet Place 0.4 mg  under the tongue every 5 (five) minutes as needed for chest pain. Limit 3 tabs per day 09/04/14  Yes Historical Provider, MD  pravastatin (PRAVACHOL) 80 MG tablet Take 80 mg by mouth daily.   Yes Historical Provider, MD   BP 129/90 mmHg  Pulse 72  Temp(Src) 97.7 F (36.5 C)  Resp 23  Ht 6' (1.829 m)  Wt 200 lb (90.719 kg)  BMI 27.12 kg/m2  SpO2 100% Physical Exam  Constitutional: He is oriented to person, place, and time. He appears well-developed and well-nourished.  HENT:  Head: Normocephalic and atraumatic.  Eyes: EOM are normal. Pupils are equal, round, and reactive to light.  Neck: Neck supple.  Cardiovascular: Regular rhythm, normal heart sounds and intact distal pulses.   Irregularly irregular  Pulmonary/Chest: Effort normal.  Slightly decreased breath sounds at the left base. Patient does not have respiratory distress at rest.  Abdominal: Soft. Bowel sounds are normal. He exhibits no distension. There is no tenderness.  Musculoskeletal: Normal range of motion. He exhibits no edema or tenderness.  Neurological: He is alert and oriented to person, place, and time. He has normal strength. Coordination normal. GCS eye subscore is 4. GCS verbal subscore is 5. GCS motor subscore is 6.  Skin: Skin is warm, dry and intact.  Psychiatric: He has a normal mood and affect.    ED Course  Procedures (including critical care time) Labs Review Labs Reviewed  CBC - Abnormal; Notable for the following:    RBC 3.72 (*)    Hemoglobin 10.7 (*)    HCT 32.1 (*)    All other components within normal limits  BASIC METABOLIC PANEL - Abnormal; Notable for the following:    Glucose, Bld 257 (*)    BUN 24 (*)    GFR calc non Af Amer 52 (*)    All other components within normal limits  BRAIN NATRIURETIC PEPTIDE - Abnormal; Notable for the following:    B Natriuretic Peptide 168.2 (*)    All other components within normal limits  PROTIME-INR - Abnormal; Notable for the following:     Prothrombin Time 17.0 (*)    All other components within normal limits  APTT - Abnormal; Notable for the following:    aPTT 76 (*)    All other components within normal limits  URINALYSIS, ROUTINE W REFLEX MICROSCOPIC (NOT AT [facility])  Rosezena Sensor, ED    Imaging Review Dg Chest 2 View  06/22/2015  CLINICAL DATA:  Pleural effusion EXAM: CHEST  2 VIEW COMPARISON:  06/21/2015 FINDINGS: Cardiomediastinal silhouette is stable. Again noted moderate size left pleural effusion with left basilar atelectasis or infiltrate. Right lung is clear. No pulmonary edema. IMPRESSION: Moderate size left pleural effusion with left basilar atelectasis or infiltrate. No pulmonary edema. Electronically Signed   By: Natasha Mead M.D.   On: 06/22/2015 15:30   I have personally reviewed and evaluated these images and lab results as part of my medical decision-making.   EKG Interpretation   Date/Time:  Tuesday Jun 22 2015 14:21:07 EDT Ventricular Rate:  52 PR Interval:    QRS Duration: 130 QT Interval:  441 QTC Calculation: 410 R Axis:   -80 Text Interpretation:  Atrial flutter Nonspecific IVCD with LAD LVH with  secondary repolarization abnormality Anterior infarct, old Confirmed by  Donnald Garre, MD, Lebron Conners [phone]) on 06/22/2015 4:54:32 PM     Consult: Patient's case was reviewed Dr. Elmon Kirschner of interventional radiology. The patient will be taken for diagnostic and therapeutic pleurocentesis. MDM   Final diagnoses:  Pleural effusion   The patient hasn't developed enlargement of a pleural effusion over the past 5 days. Patient is nontoxic and alert. He has chronic atrial fibrillation treated with Pradaxa. He has no associated chest pain. There is no peripheral edema. Patient did not wish to be admitted to the hospital we have arranged for diagnostic and therapeutic pleurocentesis to be done by interventional radiology. If patient tolerates this well and feels improved, plan will be for discharge with ongoing outpatient  management.    Arby Barrette, MD 06/22/15 [phone]

## 2015-06-22 NOTE — ED Notes (Signed)
Pt presents with 4-5 day h/o shortness of breath, pt seen at PCP with chest xray taken, sent to Cardiology.  Pt denies any chest pain, denies cough.  Pt reports shortness of breath at rest.

## 2015-06-22 NOTE — Telephone Encounter (Signed)
Follow Up  Pt son called states that the provider in the ER is waiting. He seems fairly upset. Informed pt that the fax was received and that the nurse was aware of where the pt is and that we are working on this. Pt requests a call back to discuss.

## 2015-06-22 NOTE — Telephone Encounter (Signed)
I received CXR results and per Radiology the pt has moderate left pleural effusion associated left basilar atelectasis increase since previous exam of 12/09/2014. I will review this information with Dr Excell Seltzerooper.

## 2015-06-22 NOTE — Procedures (Signed)
Ultrasound-guided diagnostic and therapeutic left thoracentesis performed yielding 1 liter of dark,bloody fluid. No immediate complications. Follow-up chest x-ray pending. The fluid was sent to the lab for preordered studies.        

## 2015-06-22 NOTE — Discharge Instructions (Signed)
Pleural Effusion °A pleural effusion is an abnormal buildup of fluid in the layers of tissue between your lungs and the inside of your chest (pleural space). These two layers of tissue that line both your lungs and the inside of your chest are called pleura. Usually, there is no air in the space between the pleura, only a thin layer of fluid. If left untreated, a large amount of fluid can build up and cause the lung to collapse. A pleural effusion is usually caused by another disease that requires treatment. °The two main types of pleural effusion are: °· Transudative pleural effusion. This happens when fluid leaks into the pleural space because of a low protein count in your blood or high blood pressure in your vessels. Heart failure often causes this. °· Exudative infusion. This occurs when fluid collects in the pleural space from blocked blood vessels or lymph vessels. Some lung diseases, injuries, and cancers can cause this type of effusion. °CAUSES °Pleural effusion can be caused by: °· Heart failure. °· A blood clot in the lung (pulmonary embolism). °· Pneumonia. °· Cancer. °· Liver failure (cirrhosis). °· Kidney disease. °· Complications from surgery, such as from open heart surgery. °SIGNS AND SYMPTOMS °In some cases, pleural effusion may cause no symptoms. Symptoms can include: °· Shortness of breath, especially when lying down. °· Chest pain, often worse when taking a deep breath. °· Fever. °· Dry cough that is lasting (chronic). °· Hiccups. °· Rapid breathing. °An underlying condition that is causing the pleural effusion (such as heart failure, pneumonia, blood clots, tuberculosis, or cancer) may also cause additional symptoms. °DIAGNOSIS °Your health care provider may suspect pleural effusion based on your symptoms and medical history. Your health care provider will also do a physical exam and a chest X-ray. If the X-ray shows there is fluid in your chest, you may need to have this fluid removed using a  needle (thoracentesis) so it can be tested. °You may also have: °· Imaging studies of the chest, such as: °¨ Ultrasound. °¨ CT scan. °· Blood tests for kidney and liver function. °TREATMENT °Treatment depends on the cause of the pleural effusion. Treatment may include: °· Taking antibiotic medicines to clear up an infection that is causing the pleural effusion. °· Placing a tube in the chest to drain the effusion (tube thoracostomy). This procedure is often used when there is an infection in the fluid. °· Surgery to remove the fibrous outer layer of tissue from the pleural space (decortication). °· Thoracentesis, which can improve cough and shortness of breath. °· A procedure to put medicine into the chest cavity to seal the pleural space to prevent fluid buildup (pleurodesis). °· Chemotherapy and radiation therapy. These may be required in the case of cancerous (malignant) pleural effusion. °HOME CARE INSTRUCTIONS °· Take medicines only as directed by your health care provider. °· Keep track of how long you can gently exercise before you get short of breath. Try simply walking at first. °· Do not use any tobacco products, including cigarettes, chewing tobacco, or electronic cigarettes. If you need help quitting, ask your health care provider. °· Keep all follow-up visits as directed by your health care provider. This is important. °SEEK MEDICAL CARE IF: °· The amount of time that you are able to exercise decreases or does not improve with time. °· You have pain or signs of infection at the puncture site if you had thoracentesis. Watch for: °¨ Drainage. °¨ Redness. °¨ Swelling. °· You have a fever. °  SEEK IMMEDIATE MEDICAL CARE IF: °· You are short of breath. °· You develop chest pain. °· You develop a new cough. °MAKE SURE YOU: °· Understand these instructions. °· Will watch your condition. °· Will get help right away if you are not doing well or get worse. °  °This information is not intended to replace advice  given to you by your health care provider. Make sure you discuss any questions you have with your health care provider. °  °Document Released: 01/30/2005 Document Revised: 02/20/2014 Document Reviewed: 06/25/2013 °Elsevier Interactive Patient Education ©2016 [employer] ° °

## 2015-06-22 NOTE — ED Provider Notes (Signed)
18:20- evaluation after thoracocentesis.   Medications  lidocaine (PF) (XYLOCAINE) 1 % injection (not administered)    Patient Vitals for the past 24 hrs:  BP Temp Pulse Resp SpO2 Height Weight  06/22/15 1802 128/79 mmHg - (!) 47 13 99 % - -  06/22/15 1651 120/61 mmHg - - - - - -  06/22/15 1632 157/78 mmHg - - - - - -  06/22/15 1600 129/90 mmHg - 72 23 100 % - -  06/22/15 1530 133/79 mmHg - 71 12 99 % - -  06/22/15 1500 124/79 mmHg - (!) 44 26 - - -  06/22/15 1445 117/72 mmHg - 73 (!) 30 98 % - -  06/22/15 1354 113/68 mmHg 97.7 F (36.5 C) 74 18 100 % 6' (1.829 m) 200 lb (90.719 kg)    6:26 PM Reevaluation with update and discussion. After initial assessment and treatment, an updated evaluation reveals Patient states he feels better at this time with only a little bit of intermittent left chest discomfort with deep breathing. He denies shortness of breath. Vital signs and pulse oxygenation are reassuring. Findings discussed with patient and wife, all questions answered. He plans on following up with his pulmonary service for evaluation of the effusion, and discussion of diagnostic testing, which has been ordered on the fluid.  Mancel BaleElliott Davelle Anselmi, MD 06/22/15 [phone]

## 2015-06-22 NOTE — Telephone Encounter (Signed)
I spoke with the pt's son and he states the pt's CXR showed fluid around the left lung and this was keeping the lung from fully expanding.  This [facility] and I do not have access to results in [facility]. Dr Nathanial RancherHamrick said the pt needs to contact Cardiology to have fluid drained off of lung. I spoke with Bonita QuinLinda at [facility] office and she will fax a copy of records to our office for review.

## 2015-06-22 NOTE — Telephone Encounter (Signed)
Follow Up   Pt son returned the call

## 2015-06-22 NOTE — Telephone Encounter (Signed)
I contacted Trish (cardmaster) and made her aware that the pt is in the ER and that he has a pleural effusion and is symptomatic with SOB.  ER needs to evaluate patient. The pt has seen Dr Kendrick FriesMcQuaid in Pulmonary in the past.    I spoke with Harvie Heckandy and the pt is currently getting a repeat chest x-ray.  He asked why our office did not import the pt's chest xray.  I made him aware that our office did not perform the test and that the I did not have access to the report to begin with and we had to obtain this information from the PCP. The pt's CXR images are actually available for viewing in the PACS system. The pt will continue with evaluation at [facility].

## 2015-06-23 LAB — MISC LABCORP TEST (SEND OUT): Labcorp test code: 88062

## 2015-06-23 LAB — PH, BODY FLUID: pH, Body Fluid: 7.7

## 2015-06-23 LAB — TRIGLYCERIDES, BODY FLUIDS: Triglycerides, Fluid: 35 mg/dL

## 2015-06-27 LAB — CULTURE, BODY FLUID-BOTTLE

## 2015-06-27 LAB — CULTURE, BODY FLUID W GRAM STAIN -BOTTLE: Culture: NO GROWTH

## 2015-06-28 ENCOUNTER — Other Ambulatory Visit (INDEPENDENT_AMBULATORY_CARE_PROVIDER_SITE_OTHER): Payer: Medicare Other

## 2015-06-28 ENCOUNTER — Ambulatory Visit (INDEPENDENT_AMBULATORY_CARE_PROVIDER_SITE_OTHER)
Admission: RE | Admit: 2015-06-28 | Discharge: 2015-06-28 | Disposition: A | Discharge status: Home / Self Care | Payer: Medicare Other | Source: Ambulatory Visit | Attending: Pulmonary Disease | Admitting: Pulmonary Disease

## 2015-06-28 ENCOUNTER — Encounter: Payer: Self-pay | Admitting: Pulmonary Disease

## 2015-06-28 ENCOUNTER — Ambulatory Visit (INDEPENDENT_AMBULATORY_CARE_PROVIDER_SITE_OTHER): Payer: Medicare Other | Admitting: Pulmonary Disease

## 2015-06-28 VITALS — BP 124/66 | Ht 73.0 in | Wt 193.0 lb

## 2015-06-28 DIAGNOSIS — I6523 Occlusion and stenosis of bilateral carotid arteries: Secondary | ICD-10-CM | POA: Diagnosis not present

## 2015-06-28 DIAGNOSIS — R0602 Shortness of breath: Secondary | ICD-10-CM

## 2015-06-28 DIAGNOSIS — J90 Pleural effusion, not elsewhere classified: Secondary | ICD-10-CM

## 2015-06-28 DIAGNOSIS — J449 Chronic obstructive pulmonary disease, unspecified: Secondary | ICD-10-CM | POA: Diagnosis not present

## 2015-06-28 LAB — CBC WITH DIFFERENTIAL/PLATELET
BASOS ABS: 0 10*3/uL (ref 0.0–0.1)
BASOS PCT: 0.4 % (ref 0.0–3.0)
EOS PCT: 1.5 % (ref 0.0–5.0)
Eosinophils Absolute: 0.1 10*3/uL (ref 0.0–0.7)
HEMATOCRIT: 31.6 % — AB (ref 39.0–52.0)
Hemoglobin: 10.7 g/dL — ABNORMAL LOW (ref 13.0–17.0)
LYMPHS ABS: 1 10*3/uL (ref 0.7–4.0)
LYMPHS PCT: 12.5 % (ref 12.0–46.0)
MCHC: 33.9 g/dL (ref 30.0–36.0)
MCV: 84.6 fl (ref 78.0–100.0)
MONOS PCT: 10.8 % (ref 3.0–12.0)
Monocytes Absolute: 0.9 10*3/uL (ref 0.1–1.0)
NEUTROS ABS: 6.2 10*3/uL (ref 1.4–7.7)
NEUTROS PCT: 74.8 % (ref 43.0–77.0)
PLATELETS: 305 10*3/uL (ref 150.0–400.0)
RBC: 3.74 Mil/uL — ABNORMAL LOW (ref 4.22–5.81)
RDW: 14.2 % (ref 11.5–15.5)
WBC: 8.3 10*3/uL (ref 4.0–10.5)

## 2015-06-28 NOTE — Assessment & Plan Note (Signed)
He only has moderate airflow obstruction and no symptoms so I'm okay with him not taking Spiriva as he is doing.  He will need to have a flu shot in the fall

## 2015-06-28 NOTE — Patient Instructions (Signed)
We will call you with the results of today's chest x-ray and blood test We will have you come back in one month and we will get a chest x-ray annual be seen by one of our nurse practitioners I will see you back in 3 months or sooner if needed

## 2015-06-28 NOTE — Progress Notes (Signed)
Subjective:    Patient ID: Frank Marks, male    DOB: 26-Apr-1926, 82 y.o.   MRN: [id number]  Synopsis: Referred in 2016 for evaluation of dyspnea in the setting of COPD and aortic stenosis and coronary artery disease.Marland Kitchen.  Has moderate COPD despite a brief smoking history, declined AAT testing.  Also has significant coronary artery disease. July 2016 pulmonary function testing ratio 63%, FEV1 2.12 L (70% predicted), total lung capacity 6.01 L (78% predicted), DLCO 13.99 (38% predicted) 09/2014 CT chest : Multiple "tiny" nodules, non-specific interstitial changes near pleura, mild emphysema, cardiac artery and valve calcification   HPI  Chief Complaint  Patient presents with  . Follow-up    pt c/o stable SOB with any exertion, prod cough with some colored mucus.  denies CP.   Frank Marks is here to follow up from an ER visit.  He had dyspnea for several weeks.  He had improved chest pain initially after his surgeyr back in September, but his dyspnea has persisted.  The dyspnea really worsened last week and he ended up in the ER and had a thoracentesis by radiology.  This showed an exudative effusion which was Ritch with lymphocytes. Since then he had no shortness of breath. He also denies cough, wheezing, chest tightness. He has not had any leg swelling recently.  Past Medical History  Diagnosis Date  . Hypertension   . Hypercholesterolemia   . Chronic back pain   . Hx of cardiovascular stress test     Lex MV 3/14:  Not gated, apical thinning, no ischemia.   Marland Kitchen. Hx of echocardiogram 2014    Echo 04/24/12: mild LVH, EF 50-55%, mild AS (mean 9), MAC, mild MR, mod-severe BAE, mod TR, PASP 39  . Acute on chronic diastolic CHF (congestive heart failure), NYHA class 3 (HCC) 07/29/2014  . TIA (transient ischemic attack) 06/2007    "had 2-3 little ones around that time"  . Carotid artery stenosis   . Chronic atrial fibrillation (HCC)   . Type II diabetes mellitus (HCC)   . Osteoarthritis   .  Arthritis     "back" (07/29/2014)  . Chronic lower back pain   . Pneumonia ~ 1951; 02/2014  . Dysrhythmia     afib  . Stroke (HCC)     ministroke- 2xs  . Anxiety     short periods of nervousness, reported since need for heart care, he feels anxious on & off.    Marland Kitchen. COPD (chronic obstructive pulmonary disease) (HCC)   . Aortic stenosis     a. s/p bioprosthetic AVR 9/16.  b. Echo 11/16: Mild LVH, EF 50-55%, normal wall motion, indeterminate diastolic function, AVR okay with mean gradient 7 mmHg and no regurgitation, borderline dilated aortic root (38 mm), mildly dilated ascending aorta (40 mm), MAC, mild MR, severe LAE, mildly reduced RVSF, mild to moderate RAE, PASP 46 mmHg, left sided pleural effusion      Review of Systems  Constitutional: Positive for fatigue. Negative for fever and chills.  HENT: Negative for postnasal drip, rhinorrhea and sinus pressure.   Respiratory: Negative for cough, shortness of breath and wheezing.   Cardiovascular: Negative for chest pain, palpitations and leg swelling.       Objective:   Physical Exam  Filed Vitals:   06/28/15 1413  BP: 124/66  Height: 6\' 1"  (1.854 m)  Weight: 193 lb (87.544 kg)    RA  Gen: chronically ill appearing but no distress HENT: OP clear, TM's clear, neck supple  PULM: Slightly diminished left base, otherwise clear CV: RRR, no mgr, trace edema GI: BS+, soft, nontender Derm: no cyanosis or rash Psyche: normal mood and affect  Hospital records from last week's ER visit personally reviewed where he had a thoracentesis showing lymphocyte rich exudative effusion Cytology was negative  Chest x-ray images from last week personally reviewed showing cardiomegaly and a left-sided pleural effusion    Assessment & Plan:  Pleural effusion Jermichael has an exudative pleural effusion which was seen on chest x-ray last week. Cytology was negative fortunately. The cell count showed lymphocyte rich exudate. I explained to him and his  son today that this can be seen in patients several months after cardiac surgery which is the most likely explanation. However, the differential diagnosis of lymphocytic pleural effusions does include malignancy and leukemias so we need to keep an eye on this. It does not sound like he has had any infectious symptoms recently which would explain this.  Plan: Chest x-ray today to ensure the effusion is gone CBC with differential If the effusion recurs then I want him to have a thoracentesis again and have the fluid sent for flow cytometry Follow-up in 4 weeks with another chest x-ray with our nurse practitioner Follow-up with me in 3 months  COPD, moderate (HCC) He only has moderate airflow obstruction and no symptoms so I'm okay with him not taking Spiriva as he is doing.  He will need to have a flu shot in the fall     Current outpatient prescriptions:  .  dabigatran (PRADAXA) 150 MG CAPS capsule, Take 1 capsule (150 mg total) by mouth 2 (two) times daily., Disp: 60 capsule, Rfl: 3 .  diltiazem (CARDIZEM CD) 240 MG 24 hr capsule, TAKE 1 CAPSULE (240 MG TOTAL) BY MOUTH DAILY., Disp: 30 capsule, Rfl: 11 .  docusate sodium (COLACE) 100 MG capsule, Take 1 capsule (100 mg total) by mouth every 12 (twelve) hours., Disp: 60 capsule, Rfl: 0 .  ferrous sulfate 325 (65 FE) MG tablet, Take 1 tablet (325 mg total) by mouth daily with breakfast. For one month, Disp: 30 tablet, Rfl: 1 .  folic acid (FOLVITE) 1 MG tablet, Take 1 mg by mouth daily., Disp: , Rfl: 3 .  glimepiride (AMARYL) 4 MG tablet, Take 4 mg by mouth daily., Disp: , Rfl:  .  Insulin Glargine (LANTUS SOLOSTAR) 100 UNIT/ML Solostar Pen, Inject 20 Units into the skin daily at 10 pm., Disp: , Rfl:  .  lisinopril (PRINIVIL,ZESTRIL) 20 MG tablet, Take 20 mg by mouth daily., Disp: , Rfl: 11 .  metFORMIN (GLUCOPHAGE) 500 MG tablet, Take 500 mg by mouth 2 (two) times daily with a meal., Disp: , Rfl:  .  metoprolol tartrate (LOPRESSOR) 25 MG  tablet, Take 1 tablet (25 mg total) by mouth 2 (two) times daily., Disp: 60 tablet, Rfl: 11 .  NITROSTAT 0.4 MG SL tablet, Place 0.4 mg under the tongue every 5 (five) minutes as needed for chest pain. Limit 3 tabs per day, Disp: , Rfl: 2 .  pravastatin (PRAVACHOL) 80 MG tablet, Take 80 mg by mouth daily., Disp: , Rfl:

## 2015-06-28 NOTE — Assessment & Plan Note (Signed)
Frank Marks has an exudative pleural effusion which was seen on chest x-ray last week. Cytology was negative fortunately. The cell count showed lymphocyte rich exudate. I explained to him and his son today that this can be seen in patients several months after cardiac surgery which is the most likely explanation. However, the differential diagnosis of lymphocytic pleural effusions does include malignancy and leukemias so we need to keep an eye on this. It does not sound like he has had any infectious symptoms recently which would explain this.  Plan: Chest x-ray today to ensure the effusion is gone CBC with differential If the effusion recurs then I want him to have a thoracentesis again and have the fluid sent for flow cytometry Follow-up in 4 weeks with another chest x-ray with our nurse practitioner Follow-up with me in 3 months

## 2015-06-29 ENCOUNTER — Other Ambulatory Visit: Payer: Self-pay

## 2015-06-29 ENCOUNTER — Other Ambulatory Visit (HOSPITAL_COMMUNITY): Payer: Self-pay | Admitting: Physician Assistant

## 2015-06-29 DIAGNOSIS — J90 Pleural effusion, not elsewhere classified: Secondary | ICD-10-CM

## 2015-06-29 NOTE — Addendum Note (Signed)
Addended by: Boone MasterJONES, JESSICA E on: 06/29/2015 03:31 PM   Modules accepted: Orders

## 2015-06-30 ENCOUNTER — Encounter: Payer: Self-pay | Admitting: Pulmonary Disease

## 2015-06-30 ENCOUNTER — Telehealth: Payer: Self-pay | Admitting: Pulmonary Disease

## 2015-06-30 NOTE — Telephone Encounter (Signed)
Dr Kendrick FriesMcQuaid please advise on e-mail   Hi Dr. Kendrick FriesMcQuaid,   I had my father in to see you Monday -- his name is Frank Marks, DOB 2026/04/13 -- your nurse called me with the results of the xray you ordered and said the fluid was back, but looked like more than was originally extracted from the outer part of his left lung. The soonest appointment to have that extracted at [facility] is 10:00 Monday May 22. He says he feels the pressure more every day. I'm wondering if you are able to help with a process that's easier for him -- I feel like I'm caught up in a medical system that's not beneficial to an 82 year old patient. I can't seem to get it scheduled with one of your colleagues any sooner. My only choice is to go to the ER, which not only costs him more, but stresses him out a LOT more because of the sitting and waiting, going through their many steps. Is there a way to get this procedure moved up to today or tomorrow? He says it gets worse every day. You can call my cell [phone] to let me know. Thanks, Frank Marks

## 2015-06-30 NOTE — Telephone Encounter (Signed)
Dr Delton CoombesByrum, as Dr Kendrick FriesMcQuaid is not available this afternoon, please advise if you have any other recommendations for this patient. Pt has been advised to contact IR who is performing the procedure and see if there are any sooner appts - patient Son [name] contacted IR and they advised the earliest appt they could scheduled would be 07/05/15. Pt is having more increased SOB/pressure in chest and is stating that going to the ED is not an option. At this point, IR cannot offer an earlier appt.

## 2015-06-30 NOTE — Telephone Encounter (Signed)
Spoke to MW before scheduling pt to see if an in-house thoracentesis is an option for pt-states that he does not feel that this can be done, but he is happy to see pt as an acute ov.   Called pt's son to relay provider recs, offered acute ov. Pt's son declined, saying he will reassess pt in the morning to see if an ov or ED visit is necessary between now and Monday.  Pt's son states he will call IR daily to check for cancellations so thoracentesis can be moved up.  I again stressed the importance of going to ED if pt's chest pain/pressure worsens.  Pt's son expressed understanding.  Nothing further needed.

## 2015-06-30 NOTE — Telephone Encounter (Signed)
If he is developing breathing distress nad if IR cannot do the procedure sooner then ED is the best option. Alternatively we could try to do thora in this office sooner than the 22nd, but not clear to me that we can arrange any faster.

## 2015-06-30 NOTE — Telephone Encounter (Signed)
Patient's son called again anxious stating the patient needs fluid drawn from lung today, Kathrine Cordsandy CB [phone].

## 2015-06-30 NOTE — Telephone Encounter (Signed)
Spoke with pt's son. Pt would like to have his thoracentesis moved up.  [facility] They will need to call central scheduling at [phone].  Spoke with pt's son. He has been given this phone number and will call to move appointment up.

## 2015-07-01 ENCOUNTER — Ambulatory Visit (INDEPENDENT_AMBULATORY_CARE_PROVIDER_SITE_OTHER)
Admission: RE | Admit: 2015-07-01 | Discharge: 2015-07-01 | Disposition: A | Discharge status: Home / Self Care | Payer: Medicare Other | Source: Ambulatory Visit | Attending: Pulmonary Disease | Admitting: Pulmonary Disease

## 2015-07-01 ENCOUNTER — Other Ambulatory Visit: Payer: Self-pay | Admitting: Pulmonary Disease

## 2015-07-01 ENCOUNTER — Encounter: Payer: Self-pay | Admitting: Acute Care

## 2015-07-01 ENCOUNTER — Telehealth: Payer: Self-pay | Admitting: Pulmonary Disease

## 2015-07-01 ENCOUNTER — Ambulatory Visit (HOSPITAL_COMMUNITY)
Admission: RE | Admit: 2015-07-01 | Discharge: 2015-07-01 | Disposition: A | Discharge status: Home / Self Care | Payer: Medicare Other | Source: Ambulatory Visit | Attending: Pulmonary Disease | Admitting: Pulmonary Disease

## 2015-07-01 ENCOUNTER — Ambulatory Visit (INDEPENDENT_AMBULATORY_CARE_PROVIDER_SITE_OTHER): Payer: Medicare Other | Admitting: Acute Care

## 2015-07-01 VITALS — BP 160/86 | HR 75 | Temp 97.5°F

## 2015-07-01 DIAGNOSIS — J90 Pleural effusion, not elsewhere classified: Secondary | ICD-10-CM

## 2015-07-01 DIAGNOSIS — I6523 Occlusion and stenosis of bilateral carotid arteries: Secondary | ICD-10-CM

## 2015-07-01 DIAGNOSIS — R0602 Shortness of breath: Secondary | ICD-10-CM

## 2015-07-01 DIAGNOSIS — R079 Chest pain, unspecified: Secondary | ICD-10-CM | POA: Diagnosis not present

## 2015-07-01 NOTE — Telephone Encounter (Signed)
I called Frank Marks to discuss the results of the ultrasound and I ended up speaking to his son Frank Marks.  I explained that today's ultrasound showed what appears to be a loculated pleural space and with insufficient fluid to draw off.  Frank Marks tells me that his father's symptoms (chest pain and dyspnea) have progressed this week.  I explained to him that we need to have a CT scan of his chest performed and I recommended hospitalization for pain control and to expedite workup. Frank Marks stated that his father does not want to be hospitalized.  Plan: [facility] chest [facility] either this afternoon or tomorrow morning Will discuss with thoracic surgery after reviewing the images from the CT chest  Heber CarolinaBrent Dionne Knoop, MD [place] [facility] Pager: [phone] Cell: [phone] After 3pm or if no response, call [phone]

## 2015-07-01 NOTE — Patient Instructions (Signed)
We will check a CXR today. We will check an EKG today To [facility] department for evaluation for possible thoracentesis, most likely after holding blood thinner due to risk of bleeding. Follow up with Dr. Kendrick FriesMcQuaid  In 1 week Please contact office for sooner follow up if symptoms do not improve or worsen or seek emergency care

## 2015-07-01 NOTE — Assessment & Plan Note (Addendum)
Seen Acutely for Worsening Left Sided Chest Pressure Plan: CXR today Evaluation for [facility] at [facility]. Pt. Is on Pradexa and unlikely to have procedure today. Consider CT scan of chest to evaluate worsening left sided chest pain. EKG to R/O possible cardiac etiology. Follow up with Dr. Kendrick FriesMcQuaid in 1 week Please contact office for sooner follow up if symptoms do not improve or worsen or seek emergency care

## 2015-07-01 NOTE — Addendum Note (Signed)
Addended by: Maisie FusGREEN, Drayson Dorko M on: 07/01/2015 09:14 AM   Modules accepted: Orders

## 2015-07-01 NOTE — Telephone Encounter (Signed)
No appt was scheduled for today- pt son states that appt was made on 06/30/15 but there is nothing in the computer. Nothing further needed. Pt put on schedule to SG to be seen today. Pt to be made aware of appt upon arrival - pt on his way now to the office to be seen.

## 2015-07-01 NOTE — Progress Notes (Signed)
History of Present Illness Frank Marks is a 82 y.o. male with moderate COPD despite brief smoking history, CAD, seen by Dr. Kendrick Fries.  Synopsis: Referred in 2016 for evaluation of dyspnea in the setting of COPD and aortic stenosis and coronary artery disease.Marland Kitchen Has moderate COPD despite a brief smoking history, declined AAT testing. Also has significant coronary artery disease. July 2016 pulmonary function testing ratio 63%, FEV1 2.12 L (70% predicted), total lung capacity 6.01 L (78% predicted), DLCO 13.99 (38% predicted) 09/2014 CT chest : Multiple "tiny" nodules, non-specific interstitial changes near pleura, mild emphysema, cardiac artery and valve calcification   5/18/2017Acute Office Visit.  Pt. presents to the office with acute worsening of chest pressure post thoracentesis on 06/22/2015 in the emergency department at [facility]. (Of note the patient had CABG 4 with AVR in September 2016 ).He was seen 06/28/15 by Dr. Kendrick Fries for  similar complaints to what he has today, but to a lesser degree. CXR indicated a slight interval increase in the volume of pleural fluid on the left on 5/15. Dr. Kendrick Fries had arranged for the patient to have a thoracentesis on 07/05/2015. CXR this am ( 07/01/2015) indicates similar small volume left sided pleural effusion.The patient continues to complain of left sided chest pressure, and dyspnea, with a non-productive cough.He denies fever, orthopnea or hemoptysis. The patient's son called central scheduling to have the thoracentesis moved up.The patient  has been scheduled  for a possible thoracentesis today at 10:30 am at [facility] patient is on Pradaxa. I have also alerted the CT Tech at [facility] that the patient is on Pradaxa. I did speak with the son about the increased risk of bleed with blood thinner, and that it is doubtful that any one will perform the procedure until his father has been off the blood thinner for several days.EKG done in the  office today indicated A Flutter which is the patient's baseline EKG. the patient is frail, and weak, and is currently in a wheelchair.  Tests 06/28/2015: CXR: light interval increase in the volume of pleural fluid on the left on 5/15.   06/30/2015 CXR: similar small volume left sided pleural effusion   Past medical hx Past Medical History  Diagnosis Date  . Hypertension   . Hypercholesterolemia   . Chronic back pain   . Hx of cardiovascular stress test     Lex MV 3/14:  Not gated, apical thinning, no ischemia.   Marland Kitchen Hx of echocardiogram 2014    Echo 04/24/12: mild LVH, EF 50-55%, mild AS (mean 9), MAC, mild MR, mod-severe BAE, mod TR, PASP 39  . Acute on chronic diastolic CHF (congestive heart failure), NYHA class 3 (HCC) 07/29/2014  . TIA (transient ischemic attack) 06/2007    "had 2-3 little ones around that time"  . Carotid artery stenosis   . Chronic atrial fibrillation (HCC)   . Type II diabetes mellitus (HCC)   . Osteoarthritis   . Arthritis     "back" (07/29/2014)  . Chronic lower back pain   . Pneumonia ~ 1951; 02/2014  . Dysrhythmia     afib  . Stroke (HCC)     ministroke- 2xs  . Anxiety     short periods of nervousness, reported since need for heart care, he feels anxious on & off.    Marland Kitchen COPD (chronic obstructive pulmonary disease) (HCC)   . Aortic stenosis     a. s/p bioprosthetic AVR 9/16.  b. Echo 11/16: Mild LVH, EF  50-55%, normal wall motion, indeterminate diastolic function, AVR okay with mean gradient 7 mmHg and no regurgitation, borderline dilated aortic root (38 mm), mildly dilated ascending aorta (40 mm), MAC, mild MR, severe LAE, mildly reduced RVSF, mild to moderate RAE, PASP 46 mmHg, left sided pleural effusion     Past surgical hx, Family hx, Social hx all reviewed.  Current Outpatient Prescriptions on File Prior to Visit  Medication Sig  . dabigatran (PRADAXA) 150 MG CAPS capsule Take 1 capsule (150 mg total) by mouth 2 (two) times daily.  Marland Kitchen. diltiazem  (CARDIZEM CD) 240 MG 24 hr capsule TAKE 1 CAPSULE (240 MG TOTAL) BY MOUTH DAILY.  Marland Kitchen. docusate sodium (COLACE) 100 MG capsule Take 1 capsule (100 mg total) by mouth every 12 (twelve) hours.  . ferrous sulfate 325 (65 FE) MG tablet Take 1 tablet (325 mg total) by mouth daily with breakfast. For one month  . folic acid (FOLVITE) 1 MG tablet Take 1 mg by mouth daily.  Marland Kitchen. glimepiride (AMARYL) 4 MG tablet Take 4 mg by mouth daily.  . Insulin Glargine (LANTUS SOLOSTAR) 100 UNIT/ML Solostar Pen Inject 20 Units into the skin daily at 10 pm.  . lisinopril (PRINIVIL,ZESTRIL) 20 MG tablet Take 20 mg by mouth daily.  . metFORMIN (GLUCOPHAGE) 500 MG tablet Take 500 mg by mouth 2 (two) times daily with a meal.  . metoprolol tartrate (LOPRESSOR) 25 MG tablet Take 1 tablet (25 mg total) by mouth 2 (two) times daily.  Marland Kitchen. NITROSTAT 0.4 MG SL tablet Place 0.4 mg under the tongue every 5 (five) minutes as needed for chest pain. Limit 3 tabs per day  . pravastatin (PRAVACHOL) 80 MG tablet Take 80 mg by mouth daily.   No current facility-administered medications on file prior to visit.     No Known Allergies  Review Of Systems:  Constitutional:   No  weight loss, night sweats,  Fevers, chills, fatigue, or  lassitude.  HEENT:   No headaches,  Difficulty swallowing,  Tooth/dental problems, or  Sore throat,                No sneezing, itching, ear ache, nasal congestion, post nasal drip,   CV:  + chest pain,  Orthopnea, PND, swelling in lower extremities, anasarca, dizziness, palpitations, syncope.   GI  No heartburn, indigestion, abdominal pain, nausea, vomiting, diarrhea, change in bowel habits, loss of appetite, bloody stools.   Resp: + shortness of breath with exertion or at rest.  No excess mucus, no productive cough,  + non-productive cough,  No coughing up of blood.  No change in color of mucus.  No wheezing.  No chest wall deformity  Skin: no rash or lesions.  GU: no dysuria, change in color of urine, no  urgency or frequency.  No flank pain, no hematuria   MS:  No joint pain or swelling.  No decreased range of motion.  No back pain.  Psych:  No change in mood or affect. No depression or anxiety.  No memory loss.   Vital Signs BP 160/86 mmHg  Pulse 75  Temp(Src) 97.5 F (36.4 C) (Oral)  SpO2 98%   Physical Exam:  General- No distress,  A&Ox3, frail elderly gentleman Who is hard of hearing ENT: No sinus tenderness, TM clear, pale nasal mucosa, no oral exudate,no post nasal drip, no LAN Cardiac: S1, S2, regular rate and rhythm, no murmur Chest: No wheeze/ + left lower basilar rales/ no dullness; no accessory muscle use, no nasal  flaring, no sternal retractions Abd.: Soft Non-tender Ext: No clubbing cyanosis, edema Neuro:  Personal on, his father is weaker than baseline Skin: No rashes, warm and dry Psych: normal mood and behavior, anxious   Assessment/Plan  Pleural effusion Seen Acutely for Worsening Left Sided Chest Pressure Plan: CXR today [facility] Pt. Is on Pradexa and unlikely to have procedure today. Consider CT scan of chest to evaluate worsening left sided chest pain. EKG to R/O possible cardiac etiology. Follow up with Dr. Kendrick Fries in 1 week Please contact office for sooner follow up if symptoms do not improve or worsen or seek emergency care      Bevelyn Ngo, NP 07/01/2015  1:04 PM

## 2015-07-01 NOTE — Progress Notes (Signed)
Thoracentesis not performed secondary to inadequate amount of fluid.  Fluid that was present was complex and loculated.  D/W Dr. Kendrick FriesMcQuaid who will obtain a CT of the chest as an outpatient and refer the patient to CT surgery.  [name] E 11:29 AM 07/01/2015

## 2015-07-01 NOTE — Telephone Encounter (Signed)
Per SG, send patient down for CXR upon arrival.  CXR ordered.

## 2015-07-01 NOTE — Telephone Encounter (Signed)
He states he was told to bring him in the office and patient would have an x-ray and then then have fluid removed from his lung in the office to hold him until Monday's scheduled thoracentesis. They are on their way now.  I advised I do not see a note stating this and there is no appointment for the patient today scheduled.

## 2015-07-01 NOTE — Telephone Encounter (Signed)
This pt was seen today by SG and was sent to the hospital. Nothing further was needed.

## 2015-07-02 NOTE — Progress Notes (Signed)
Reviewed and discussed with Kandice RobinsonsSarah Groce and the patient.  Plan CT chest 5/19 as the ultrasound showed likely multiple loculated fluid collections.

## 2015-07-05 ENCOUNTER — Encounter: Payer: Self-pay | Admitting: Pulmonary Disease

## 2015-07-05 ENCOUNTER — Ambulatory Visit (HOSPITAL_COMMUNITY): Payer: Medicare Other

## 2015-07-05 ENCOUNTER — Telehealth: Payer: Self-pay | Admitting: Pulmonary Disease

## 2015-07-05 DIAGNOSIS — J90 Pleural effusion, not elsewhere classified: Secondary | ICD-10-CM

## 2015-07-05 NOTE — Telephone Encounter (Signed)
[facility] Images and report in PACS. Report printed and left in BQ's box for review. Advised pt son that BQ is out of office until Thursday and he will review CT at that time. Son asked if I could release the results to MyChart and I advised that we do not have authority to release results and that they would have to be reviewed and signed off on by BQ before being released. Son then asked if he could get a copy of the report so I advised him to contact [facility] to see if they would give him a copy.   Routing to [place] for follow up with BQ.

## 2015-07-05 NOTE — Telephone Encounter (Signed)
BQ please advise on pt's most recent chest imaging results.  Thanks!

## 2015-07-06 NOTE — Telephone Encounter (Signed)
A,     It took me a while to find it because even though we ordered it Epic did not put it in my in basket like I would have expected.        He needs to see Dr. Laneta SimmersBartle with thoracic surgery again. He has what appears to be a dense collection of fluid around his left lung that explains his symptoms. He may need to have another attempt to drain it in the operating room. We will not be able to drain this with a routine thoracentesis.        Thanks    [employer]     Spoke with pt's son, aware of results/recs.  Referral to thoracic surgery placed.  Nothing further needed.

## 2015-07-09 ENCOUNTER — Institutional Professional Consult (permissible substitution) (INDEPENDENT_AMBULATORY_CARE_PROVIDER_SITE_OTHER): Payer: Medicare Other | Admitting: Surgery

## 2015-07-09 ENCOUNTER — Encounter: Payer: Self-pay | Admitting: Surgery

## 2015-07-09 VITALS — BP 139/84 | HR 70 | Resp 20 | Ht 73.0 in | Wt 195.0 lb

## 2015-07-09 DIAGNOSIS — J90 Pleural effusion, not elsewhere classified: Secondary | ICD-10-CM

## 2015-07-09 DIAGNOSIS — J948 Other specified pleural conditions: Secondary | ICD-10-CM

## 2015-07-09 DIAGNOSIS — I6523 Occlusion and stenosis of bilateral carotid arteries: Secondary | ICD-10-CM

## 2015-07-09 NOTE — Progress Notes (Signed)
HPI:  [name] returns today for evaluation of a recurrent left pleural effusion. He underwent CABG x 4 and AVR with a 25 mm pericardial valve on 10/29/2014. He had to be explored the following morning for bleeding in the mediastinum due to a sternal bleeder. He had been on Pradaxa preop and coagulopathic. He had a slow postop recovery due to his age and debilitation and went to a SNF. I saw him back in the office on 12/09/2014 and a CXR showed a small persistent left pleural effusion on the lateral film. It sounds like he made a good recovery and his son says that he was riding his tractor a lot in March. The in early May he began complaining of some shortness of breath, cough, and left chest discomfort. He had a CXR that showed an increased left pleural effusion although still in the small to moderate range. He had a thoracentesis removing 1L of old bloody fluid. Cytology was negative for malignancy and only showed inflammatory cells. His CXR after the procedure showed good drainage of the effusion and mild basilar atelectasis. He had a repeat CSR on 5/15 due to persistent symptoms and it showed a recurrent increase in the left effusion. He was scheduled for repeat thoracentesis on 5/16 but US reportedly showed it to be loculated and the procedure was not done. [facility] and it showed a small to moderate loculated left pleural effusion with mild compressive atelectasis of the LLL. There were increased numbers and increased size of the lymph nodes in the anterior mediastinum and prevascular regions. Dr. Kendrick FriesMcQuaid thought that I should evaluate this.  The patient is hard of hearing and somewhat debilitated and vague about his symptoms. He says he gets short of breath sometimes just from talking. He denies any chest pain recently. Still has intermittent cough. No fever or chills.    Current Outpatient Prescriptions  Medication Sig Dispense Refill  . dabigatran (PRADAXA) 150 MG CAPS capsule  Take 1 capsule (150 mg total) by mouth 2 (two) times daily. 60 capsule 3  . diltiazem (CARDIZEM CD) 240 MG 24 hr capsule TAKE 1 CAPSULE (240 MG TOTAL) BY MOUTH DAILY. 30 capsule 11  . docusate sodium (COLACE) 100 MG capsule Take 1 capsule (100 mg total) by mouth every 12 (twelve) hours. 60 capsule 0  . ferrous sulfate 325 (65 FE) MG tablet Take 1 tablet (325 mg total) by mouth daily with breakfast. For one month 30 tablet 1  . folic acid (FOLVITE) 1 MG tablet Take 1 mg by mouth daily.  3  . glimepiride (AMARYL) 4 MG tablet Take 4 mg by mouth daily.    . Insulin Glargine (LANTUS SOLOSTAR) 100 UNIT/ML Solostar Pen Inject 20 Units into the skin daily at 10 pm.    . lisinopril (PRINIVIL,ZESTRIL) 20 MG tablet Take 20 mg by mouth daily.  11  . metFORMIN (GLUCOPHAGE) 500 MG tablet Take 500 mg by mouth 2 (two) times daily with a meal.    . metoprolol tartrate (LOPRESSOR) 25 MG tablet Take 1 tablet (25 mg total) by mouth 2 (two) times daily. 60 tablet 11  . NITROSTAT 0.4 MG SL tablet Place 0.4 mg under the tongue every 5 (five) minutes as needed for chest pain. Limit 3 tabs per day  2  . pravastatin (PRAVACHOL) 80 MG tablet Take 80 mg by mouth daily.     No current facility-administered medications for this visit.     Physical  Exam: BP 139/84 mmHg  Pulse 70  Resp 20  Ht  (1.854 m)  Wt 195 lb (88.451 kg)  BMI 25.73 kg/m2  SpO2 93% Elderly, chronically ill-appearing gentleman in no distress, in a wheel chair. Lungs: mild decrease in breath sounds on the left, particularly at base. No rhonchi or wheezing. No rales Heart: IRR with normal valve sounds, no murmur. No peripheral edema.   Diagnostic Tests:  CT of the chest from 07/02/2015 at [facility] reviewed on PACS.  Impression:  He has a small to moderate sized loculated left pleural effusion that was bloody fluid when he had a recent thoracentesis. He had a small residual left pleural effusion on CXR in October 2016 postop. He has  been on Pradaxa and certainly could have bled a little into the pleural space resulting a loculated effusion. I don't think this is related to heart failure. I would not expect this to cause him much symptoms considering his activity level but could cause some cough. I think he would be a poor candidate for open surgical drainage and VATS would probably not be an option for a loculated effusion. It may be possible to insert a PleurX catheter but it is hard to tell if that would provide complete drainage. I think it would be best to follow this for a little while to see if it gets larger or his symptoms worsen before proceeding with drainage since it will not be straight forward. I reviewed the CT and CXR films with the patient and his son Frank Marks and answered their questions.   Plan:  I will see him back in one month with a CXR. If his symptoms worsen in the meantime his son will call me to get him back to the office immediately.    [name], MD [facility] [phone]

## 2015-07-13 ENCOUNTER — Encounter: Payer: Medicare Other | Admitting: Surgery

## 2015-07-20 ENCOUNTER — Other Ambulatory Visit: Payer: Self-pay | Admitting: Surgery

## 2015-07-20 DIAGNOSIS — J90 Pleural effusion, not elsewhere classified: Secondary | ICD-10-CM

## 2015-07-21 ENCOUNTER — Ambulatory Visit
Admission: RE | Admit: 2015-07-21 | Discharge: 2015-07-21 | Disposition: A | Discharge status: Home / Self Care | Payer: Medicare Other | Source: Ambulatory Visit | Attending: Surgery | Admitting: Surgery

## 2015-07-21 ENCOUNTER — Ambulatory Visit (INDEPENDENT_AMBULATORY_CARE_PROVIDER_SITE_OTHER): Payer: Medicare Other | Admitting: Surgery

## 2015-07-21 ENCOUNTER — Encounter: Payer: Self-pay | Admitting: Surgery

## 2015-07-21 VITALS — BP 175/94 | HR 64 | Resp 18 | Ht 73.0 in | Wt 195.0 lb

## 2015-07-21 DIAGNOSIS — Z954 Presence of other heart-valve replacement: Secondary | ICD-10-CM | POA: Diagnosis not present

## 2015-07-21 DIAGNOSIS — J948 Other specified pleural conditions: Secondary | ICD-10-CM

## 2015-07-21 DIAGNOSIS — J90 Pleural effusion, not elsewhere classified: Secondary | ICD-10-CM

## 2015-07-21 DIAGNOSIS — Z951 Presence of aortocoronary bypass graft: Secondary | ICD-10-CM | POA: Diagnosis not present

## 2015-07-21 DIAGNOSIS — Z952 Presence of prosthetic heart valve: Secondary | ICD-10-CM

## 2015-07-21 DIAGNOSIS — R0602 Shortness of breath: Secondary | ICD-10-CM | POA: Diagnosis not present

## 2015-07-21 DIAGNOSIS — I6523 Occlusion and stenosis of bilateral carotid arteries: Secondary | ICD-10-CM

## 2015-07-21 NOTE — Progress Notes (Signed)
HPI:  The patient returns today for further evaluation of shortness of breath and left pleural effusion. I just saw him on 07/09/2015 and reviewed his CT which showed a small loculated left pleural effusion. I did not think it was large enough to be causing much problem and when IR did an ultrasound it appeared loculated. We decided to follow this for a while. He returns today reporting persistent shortness of breath which was severe yesterday and associated with some left chest discomfort. He says that he felt like he was "going to leave this earth" yesterday. He took NTG and coughed up some sputum and felt better.  Current Outpatient Prescriptions  Medication Sig Dispense Refill  . dabigatran (PRADAXA) 150 MG CAPS capsule Take 1 capsule (150 mg total) by mouth 2 (two) times daily. 60 capsule 3  . diltiazem (CARDIZEM CD) 240 MG 24 hr capsule TAKE 1 CAPSULE (240 MG TOTAL) BY MOUTH DAILY. 30 capsule 11  . docusate sodium (COLACE) 100 MG capsule Take 1 capsule (100 mg total) by mouth every 12 (twelve) hours. 60 capsule 0  . ferrous sulfate 325 (65 FE) MG tablet Take 1 tablet (325 mg total) by mouth daily with breakfast. For one month 30 tablet 1  . folic acid (FOLVITE) 1 MG tablet Take 1 mg by mouth daily.  3  . glimepiride (AMARYL) 4 MG tablet Take 4 mg by mouth daily.    . Insulin Glargine (LANTUS SOLOSTAR) 100 UNIT/ML Solostar Pen Inject 20 Units into the skin daily at 10 pm.    . lisinopril (PRINIVIL,ZESTRIL) 20 MG tablet Take 20 mg by mouth daily.  11  . metFORMIN (GLUCOPHAGE) 500 MG tablet Take 500 mg by mouth 2 (two) times daily with a meal.    . metoprolol tartrate (LOPRESSOR) 25 MG tablet Take 1 tablet (25 mg total) by mouth 2 (two) times daily. 60 tablet 11  . NITROSTAT 0.4 MG SL tablet Place 0.4 mg under the tongue every 5 (five) minutes as needed for chest pain. Limit 3 tabs per day  2  . pravastatin (PRAVACHOL) 80 MG tablet Take 80 mg by mouth daily.     No current  facility-administered medications for this visit.     Physical Exam: BP 175/94 mmHg  Pulse 64  Resp 18  Ht 6\' 1"  (1.854 m)  Wt 195 lb (88.451 kg)  BMI 25.73 kg/m2  SpO2 95% He look frail. Able to take a few steps but rode in and out in wheel chair. Lung exam shows slight decrease in breath sounds at the left base. Cardiac exam shows an irregular rate and rhythm with normal heart sounds. There is no murmur. mild edema in ankles  Diagnostic Tests:  CLINICAL DATA: Status post 2 L thoracentesis on the left 3 days ago; evaluate for recurrent pleural effusion ; status post CABG in September 2016, history of COPD an aortic stenosis with aortic valve replacement.  EXAM: CHEST 2 VIEW  COMPARISON: CT scan of the chest of Jul 02, 2015  FINDINGS: There remains a small left pleural effusion. There is some fluid versus pleural thickening along the left lateral thoracic wall which is not new. There is no significant right-sided pleural effusion. The cardiac silhouette remains enlarged. The prosthetic aortic valve cage is visible. There are post CABG changes. The central pulmonary vascularity remains engorged. The interstitial markings remain mildly increased bilaterally. There is no acute bony abnormality.  IMPRESSION: COPD with mild CHF. Small left pleural effusion. No pneumothorax.  Electronically Signed  By: David Swaziland M.D.  On: 07/21/2015 09:35   Impression:  He continues to complain of shortness of breath that is severe at times. I don't think this is related to the small left pleural effusion. This effusion is not easy to drain since it is loculated and small. I would not recommend a thoracotomy for drainage. I don't think it is large enough to allow safe insertion of a PleurX catheter. I suspect that his shortness of breath is more likely due to diastolic CHF, severe HTN, moderate obstructive lung disease and severe diffusion defect, mucous plugging from  sinus drainage, advanced age and frailty.   Plan:  He is going to continue follow up with Dr. Excell Seltzer. I will be happy to see him back if he develops enlargement of his right pleural effusion but I don't think there is anything to do about it at this time and I don't think it is large enough to be causing his symptoms.   [name], MD [facility] [phone]

## 2015-07-29 ENCOUNTER — Other Ambulatory Visit: Payer: Self-pay | Admitting: Surgery

## 2015-07-29 DIAGNOSIS — Z951 Presence of aortocoronary bypass graft: Secondary | ICD-10-CM

## 2015-08-04 ENCOUNTER — Telehealth: Payer: Self-pay | Admitting: Cardiovascular Disease

## 2015-08-04 ENCOUNTER — Encounter: Payer: Medicare Other | Admitting: Surgery

## 2015-08-04 NOTE — Telephone Encounter (Signed)
New message    Pt son is calling to speak to the rn to schedule an appt with Dr.Cooper

## 2015-08-04 NOTE — Telephone Encounter (Signed)
Frank Marks st that the patient needs to make an OV with Dr. Excell Seltzerooper because Dr. Laneta SimmersBartle "deferred the problem back to him." Informed him that Dr. Earmon Phoenixooper's RN will call Friday with an appointment.  The phone went silent but the line was still connected. Instructed him to call with any questions if he has any prior to [name]'s call.

## 2015-08-04 NOTE — Telephone Encounter (Signed)
Follow-up   The pt is returning the nurses phone call from [employer]

## 2015-08-04 NOTE — Telephone Encounter (Signed)
Returned call. Secretary stated that [name] is out of the office. Left message for her that Dr. Earmon Phoenixooper's nurse will call tomorrow.  Called cell phone to speak with Harvie Heckandy- no answer and no VM to leave message.

## 2015-08-06 NOTE — Telephone Encounter (Signed)
Pt's son called said he wanted to make appt with Excell Seltzerooper because the nurse wonm't call him back-the messages states she will call hum back Friday-which is today so she hasn't not called him out of the time he was told-his call was dropped-will send this message to [employer]

## 2015-08-06 NOTE — Telephone Encounter (Signed)
I spoke with Harvie Heckandy and scheduled the pt for an appointment on 08/19/15 with Dr Excell Seltzerooper.

## 2015-08-10 ENCOUNTER — Telehealth: Payer: Self-pay | Admitting: *Deleted

## 2015-08-10 NOTE — Telephone Encounter (Signed)
Agree that Pradaxa should be BID.  Unsure why pt was only taking once daily.  This should not be contributing to his pleural effusion.

## 2015-08-10 NOTE — Telephone Encounter (Signed)
Informed Lonna Pradaxa should be taken twice daily and the medication should not be contributing to pleural effusion. Per request, medication list mailed to patient.

## 2015-08-10 NOTE — Telephone Encounter (Signed)
Mylo RedLonna Hart called on patients behalf. She stated that the patient has been taking pradaxa 150 mg qd and she is questioning why it is now bid. I made her aware that pradaxa should be taken bid. She also stated that the patient has been having some fluid removed from around his lung and wanted to know if the medication has any effect on this. Caroleen HammanLonna can be reached at [phone]. Thanks, MI

## 2015-08-19 ENCOUNTER — Ambulatory Visit (INDEPENDENT_AMBULATORY_CARE_PROVIDER_SITE_OTHER): Payer: Medicare Other | Admitting: Cardiovascular Disease

## 2015-08-19 ENCOUNTER — Ambulatory Visit
Admission: RE | Admit: 2015-08-19 | Discharge: 2015-08-19 | Disposition: A | Discharge status: Home / Self Care | Payer: Medicare Other | Source: Ambulatory Visit | Attending: Cardiovascular Disease | Admitting: Cardiovascular Disease

## 2015-08-19 ENCOUNTER — Encounter: Payer: Self-pay | Admitting: Cardiovascular Disease

## 2015-08-19 VITALS — BP 120/66 | HR 72 | Ht 73.0 in | Wt 195.0 lb

## 2015-08-19 DIAGNOSIS — I5022 Chronic systolic (congestive) heart failure: Secondary | ICD-10-CM | POA: Diagnosis not present

## 2015-08-19 DIAGNOSIS — I482 Chronic atrial fibrillation, unspecified: Secondary | ICD-10-CM

## 2015-08-19 DIAGNOSIS — I6523 Occlusion and stenosis of bilateral carotid arteries: Secondary | ICD-10-CM | POA: Diagnosis not present

## 2015-08-19 DIAGNOSIS — R0602 Shortness of breath: Secondary | ICD-10-CM | POA: Diagnosis not present

## 2015-08-19 MED ORDER — FUROSEMIDE 20 MG PO TABS: ORAL_TABLET | ORAL | Status: DC

## 2015-08-19 NOTE — Patient Instructions (Addendum)
Medication Instructions:  Your physician recommends that you continue on your current medications as directed. Please refer to the Current Medication list given to you today.  Labwork: No new orders.   Testing/Procedures: A chest x-ray takes a picture of the organs and structures inside the chest, including the heart, lungs, and blood vessels. This test can show several things, including, whether the heart is enlarges; whether fluid is building up in the lungs; and whether pacemaker / defibrillator leads are still in place. [facility] Building, 1st floor)  Follow-Up: Your physician wants you to follow-up in: 4 MONTHS with Dr Excell Seltzerooper (November). You will receive a reminder letter in the mail two months in advance. If you don't receive a letter, please call our office to schedule the follow-up appointment.   Any Other Special Instructions Will Be Listed Below (If Applicable).     If you need a refill on your cardiac medications before your next appointment, please call your pharmacy.   Dr Excell Seltzerooper called the pt's son in regards to CXR results.    Notes Recorded by Tonny BollmanMichael Cooper, MD on 08/19/2015 at 4:43 PM X-ray reviewed. Pleural effusion is stable and small. Considering his shortness of breath has progressed, will give a trial of Lasix 20 mg daily 1 week. If symptomatically improvement would continue. If no change in symptoms would discontinue after 1 week. Explained instructions to his son [name].

## 2015-08-21 NOTE — Progress Notes (Signed)
Cardiology Office Note Date:  08/21/2015   ID:  Frank Marks, DOB 03/29/1926, MRN [id number]  PCP:  Ailene Ravel, MD  Cardiologist:  [name], MD    Chief Complaint  Patient presents with  . Shortness of Breath   History of Present Illness: Frank Marks is a 82 y.o. male who presents for follow-up evaluation. He is here with his son, Frank Marks, today. He continues to struggle with shortness of breath. Denies leg edema, orthopnea, PND, or chest pain. He feels fatigued and is not able to do much physical activity.   He has had a left pleural effusion postoperatively but it is loculated and not felt to be large enough to be contributing much to his symptoms. He's been treated by Dr Laneta Simmers and Dr Kendrick Fries.   Past Medical History  Diagnosis Date  . Hypertension   . Hypercholesterolemia   . Chronic back pain   . Hx of cardiovascular stress test     Lex MV 3/14:  Not gated, apical thinning, no ischemia.   Marland Kitchen Hx of echocardiogram 2014    Echo 04/24/12: mild LVH, EF 50-55%, mild AS (mean 9), MAC, mild MR, mod-severe BAE, mod TR, PASP 39  . Acute on chronic diastolic CHF (congestive heart failure), NYHA class 3 (HCC) 07/29/2014  . TIA (transient ischemic attack) 06/2007    "had 2-3 little ones around that time"  . Carotid artery stenosis   . Chronic atrial fibrillation (HCC)   . Type II diabetes mellitus (HCC)   . Osteoarthritis   . Arthritis     "back" (07/29/2014)  . Chronic lower back pain   . Pneumonia ~ 1951; 02/2014  . Dysrhythmia     afib  . Stroke (HCC)     ministroke- 2xs  . Anxiety     short periods of nervousness, reported since need for heart care, he feels anxious on & off.    Marland Kitchen COPD (chronic obstructive pulmonary disease) (HCC)   . Aortic stenosis     a. s/p bioprosthetic AVR 9/16.  b. Echo 11/16: Mild LVH, EF 50-55%, normal wall motion, indeterminate diastolic function, AVR okay with mean gradient 7 mmHg and no regurgitation, borderline dilated aortic root  (38 mm), mildly dilated ascending aorta (40 mm), MAC, mild MR, severe LAE, mildly reduced RVSF, mild to moderate RAE, PASP 46 mmHg, left sided pleural effusion    Past Surgical History  Procedure Laterality Date  . Lumbar laminectomy/decompression microdiscectomy  1990's X 1; 2000's X1  . Knee arthroscopy Left   . Tonsillectomy    . Appendectomy    . Replacement total knee Bilateral ~ 2001-2002  . Total hip arthroplasty Left ~ 2003  . Joint replacement    . Cataract extraction w/ intraocular lens  implant, bilateral Bilateral   . Cardiac catheterization  07/29/2014  . Cardiac catheterization N/A 07/29/2014    Procedure: Left Heart Cath and Coronary Angiography;  Surgeon: [name], MD;  Location: [facility] INVASIVE CV LAB;  Service: Cardiovascular;  Laterality: N/A;  . Back surgery      x2   . Eye surgery      cataracts - removed- bilateral, /w IOL  . Coronary artery bypass graft N/A 10/29/2014    Procedure: CORONARY ARTERY BYPASS GRAFTING (CABG);  Surgeon: [name], MD;  Location: [facility] OR;  Service: Open Heart Surgery;  Laterality: N/A;  . Aortic valve replacement N/A 10/29/2014    Procedure: AORTIC VALVE REPLACEMENT (AVR);  Surgeon: [name], MD;  Location: [facility] OR;  Service: Open Heart Surgery;  Laterality: N/A;  . Tee without cardioversion N/A 10/29/2014    Procedure: TRANSESOPHAGEAL ECHOCARDIOGRAM (TEE);  Surgeon: [name], MD;  Location: [facility] OR;  Service: Open Heart Surgery;  Laterality: N/A;  . Exploration post operative open heart N/A 10/29/2014    Procedure: EXPLORATION POST OPERATIVE OPEN HEART;  Surgeon: [name], MD;  Location: [facility] OR;  Service: Open Heart Surgery;  Laterality: N/A;    Current Outpatient Prescriptions  Medication Sig Dispense Refill  . aspirin 81 MG tablet Take 81 mg by mouth daily.    . dabigatran (PRADAXA) 150 MG CAPS capsule Take 1 capsule (150 mg total) by mouth 2 (two) times daily. 60 capsule 3  . diltiazem (CARDIZEM CD) 240 MG 24 hr  capsule TAKE 1 CAPSULE (240 MG TOTAL) BY MOUTH DAILY. 30 capsule 11  . docusate sodium (COLACE) 100 MG capsule Take 1 capsule (100 mg total) by mouth every 12 (twelve) hours. 60 capsule 0  . ferrous sulfate 325 (65 FE) MG tablet Take 1 tablet (325 mg total) by mouth daily with breakfast. For one month 30 tablet 1  . Insulin Glargine (LANTUS SOLOSTAR) 100 UNIT/ML Solostar Pen Inject 20 Units into the skin daily at 10 pm.    . levothyroxine (SYNTHROID, LEVOTHROID) 50 MCG tablet Take 50 mcg by mouth every morning.    Marland Kitchen lisinopril (PRINIVIL,ZESTRIL) 20 MG tablet Take 20 mg by mouth daily.  11  . metFORMIN (GLUCOPHAGE) 500 MG tablet Take 500 mg by mouth 2 (two) times daily with a meal.    . metoprolol tartrate (LOPRESSOR) 25 MG tablet Take 1 tablet (25 mg total) by mouth 2 (two) times daily. 60 tablet 11  . NITROSTAT 0.4 MG SL tablet Place 0.4 mg under the tongue every 5 (five) minutes as needed for chest pain. Limit 3 tabs per day  2  . pravastatin (PRAVACHOL) 80 MG tablet Take 80 mg by mouth daily.    . furosemide (LASIX) 20 MG tablet Take one tablet by mouth daily, if no improvement in symptoms in 1 week please stop medication 30 tablet 3   No current facility-administered medications for this visit.    Allergies:   Review of patient's allergies indicates no known allergies.   Social History:  The patient  reports that he quit smoking about 31 years ago. His smoking use included Cigarettes. He has a 5 pack-year smoking history. He has quit using smokeless tobacco. His smokeless tobacco use included Chew. He reports that he does not drink alcohol or use illicit drugs.   Family History:  The patient's  family history includes Alzheimer's disease in his mother; Arthritis in his mother; Healthy in his mother; Stroke in his father.    ROS:  Please see the history of present illness.  Otherwise, review of systems is positive for snoring, wheezing.  All other systems are reviewed and negative.     PHYSICAL EXAM: VS:  BP 120/66 mmHg  Pulse 72  Ht  (1.854 m)  Wt 195 lb (88.451 kg)  BMI 25.73 kg/m2 , BMI Body mass index is 25.73 kg/(m^2). GEN: Well nourished, well developed, elderly male in no acute distress HEENT: normal Neck: no JVD, no masses.  Cardiac: irregularly irregular without murmur or gallop          Respiratory:  The left lung field is dull to percussion with decreased breath sounds, right lung clear GI: soft, nontender, nondistended, + BS MS: no  deformity or atrophy Ext: trace pretibial edema Skin: warm and dry, no rash Neuro:  Strength and sensation are intact Psych: euthymic mood, full affect  EKG:  EKG is not ordered today.  Recent Labs: 11/01/2014: ALT 37; Magnesium 1.7; TSH 1.840 06/22/2015: B Natriuretic Peptide 168.2*; BUN 24*; Creatinine, Ser 1.22; Potassium 4.0; Sodium 137 06/28/2015: Hemoglobin 10.7*; Platelets 305.0   Lipid Panel     Component Value Date/Time   CHOL 105 11/26/2014 0146   TRIG 95 11/26/2014 0146   HDL 31* 11/26/2014 0146   CHOLHDL 3.4 11/26/2014 0146   VLDL 19 11/26/2014 0146   LDLCALC 55 11/26/2014 0146      Wt Readings from Last 3 Encounters:  08/19/15 195 lb (88.451 kg)  07/21/15 195 lb (88.451 kg)  07/09/15 195 lb (88.451 kg)     Cardiac Studies Reviewed: CXR: FINDINGS: Postsurgical changes are again seen. Small left pleural effusion is again noted and stable. The right lung remains clear. No acute bony abnormality is noted.  IMPRESSION: A small left pleural effusion stable from the prior exam.  2D Echo 12-21-2014: Study Conclusions  - Left ventricle: The cavity size was normal. Wall thickness was  increased in a pattern of mild LVH. Systolic function was low  normal to mildly reduced. The estimated ejection fraction was in  the range of 50% to 55%. Wall motion was normal; there were no  regional wall motion abnormalities. Septal bounce consistent with  prior cardiac surgery. Indeterminant  diastolic function (atrial  fibrillation). - Aortic valve: There was a bioprosthetic aortic valve that  appeared to function normally. There was no significant  regurgitation. Mean gradient (S): 7 mm Hg. - Aorta: Borderline dilated aortic root, mildly dilated ascending  aorta. Aortic root diameter: 38 mm. Ascending aortic diameter: 40  mm (S). - Mitral valve: Moderately calcified annulus. There was mild  regurgitation. - Left atrium: The atrium was severely dilated. - Right ventricle: The cavity size was normal. Systolic function  was mildly reduced. - Right atrium: The atrium was mildly to moderately dilated. - Tricuspid valve: Peak RV-RA gradient (S): 31 mm Hg. - Pulmonary arteries: PA peak pressure: 46 mm Hg (S). - Systemic veins: IVC measured 2.3 cm with < 50% respirophasic  variation, suggesting RA pressure 15 mmHg. - Pericardium, extracardiac: There was a left-sided pleural  effusion.  Impressions:  - The patient was in atrial fibrillation. Normal LV size with mild  LV hypertrophy. EF 50-55%, low normal to mildly reduced systolic  function. Septal bounce consistent with prior cardiac surgery.  Normal RV size with mildly decreased systolic function.  Bioprosthetic aortic valve appeared to function normally. Mild  pulmonary hypertension.  ASSESSMENT AND PLAN: 1.  Shortness of breath: multifactorial with components of chronic lung disease, diastolic heart failure, pleural effusion, and advanced age/deconditioning. I have reviewed his CXR and despite his exam findings of decreased breath sounds throughout the left lung on exam, his CXR is stable and shows only a small effusion. Will add lasix 20 mg daily and continue other medications.   2. Chronic atrial fibrillation: tolerating pradaxa, but becoming cost-prohibitive. He is given samples today. May need to do something different with his anticoagulation.   3. HTN: BP well-controlled. Medications reviewed.   4.  CAD, native vessel: no angina after CABG last year.  Current medicines are reviewed with the patient today.  The patient does not have concerns regarding medicines.  Labs/ tests ordered today include:  Orders Placed This Encounter  Procedures  . DG  Chest 2 View    Disposition:   FU 4 months  Signed, [name], MD  08/21/2015 10:57 AM    [facility] [address] [place], [place], [place]  [address] Phone: [phone]; Fax: [phone]

## 2015-08-25 ENCOUNTER — Other Ambulatory Visit: Payer: Self-pay | Admitting: Cardiology

## 2015-09-10 ENCOUNTER — Encounter: Payer: Self-pay | Admitting: Pulmonary Disease

## 2015-09-10 ENCOUNTER — Telehealth: Payer: Self-pay | Admitting: Pulmonary Disease

## 2015-09-10 NOTE — Telephone Encounter (Signed)
I called back, he didn't answer.  I left a message on his voicemail. He really needs to talk to the doctor who ordered the test because I can't really comment on the CXR without having some context.

## 2015-09-10 NOTE — Telephone Encounter (Signed)
Spoke with pt's son, [name]. Pt had a CXR done on 09/06/15 at [facility]. [name] is wanting BQ to review this CXR in [facility] and call him back to discuss this.  BQ - please advise. Thanks.

## 2015-09-10 NOTE — Telephone Encounter (Signed)
Pt's son also sent a pt advice request. I will respond with BQ response through the portal.

## 2015-09-14 DIAGNOSIS — N390 Urinary tract infection, site not specified: Secondary | ICD-10-CM

## 2015-09-14 HISTORY — DX: Urinary tract infection, site not specified: N39.0

## 2015-09-19 ENCOUNTER — Encounter (HOSPITAL_COMMUNITY): Payer: Self-pay | Admitting: Emergency Medicine

## 2015-09-19 ENCOUNTER — Inpatient Hospital Stay (HOSPITAL_COMMUNITY)
Admission: EM | Admit: 2015-09-19 | Discharge: 2015-09-21 | DRG: 291 | Disposition: A | Discharge status: Home Health | Payer: Medicare Other | Attending: Internal Medicine | Admitting: Internal Medicine

## 2015-09-19 ENCOUNTER — Emergency Department (HOSPITAL_COMMUNITY): Payer: Medicare Other

## 2015-09-19 DIAGNOSIS — E871 Hypo-osmolality and hyponatremia: Secondary | ICD-10-CM | POA: Diagnosis present

## 2015-09-19 DIAGNOSIS — E119 Type 2 diabetes mellitus without complications: Secondary | ICD-10-CM

## 2015-09-19 DIAGNOSIS — Z953 Presence of xenogenic heart valve: Secondary | ICD-10-CM

## 2015-09-19 DIAGNOSIS — Z951 Presence of aortocoronary bypass graft: Secondary | ICD-10-CM

## 2015-09-19 DIAGNOSIS — J449 Chronic obstructive pulmonary disease, unspecified: Secondary | ICD-10-CM | POA: Diagnosis not present

## 2015-09-19 DIAGNOSIS — Z823 Family history of stroke: Secondary | ICD-10-CM

## 2015-09-19 DIAGNOSIS — I509 Heart failure, unspecified: Secondary | ICD-10-CM

## 2015-09-19 DIAGNOSIS — D638 Anemia in other chronic diseases classified elsewhere: Secondary | ICD-10-CM | POA: Diagnosis present

## 2015-09-19 DIAGNOSIS — Z794 Long term (current) use of insulin: Secondary | ICD-10-CM

## 2015-09-19 DIAGNOSIS — I10 Essential (primary) hypertension: Secondary | ICD-10-CM | POA: Diagnosis not present

## 2015-09-19 DIAGNOSIS — E1122 Type 2 diabetes mellitus with diabetic chronic kidney disease: Secondary | ICD-10-CM | POA: Diagnosis present

## 2015-09-19 DIAGNOSIS — E7800 Pure hypercholesterolemia, unspecified: Secondary | ICD-10-CM | POA: Diagnosis present

## 2015-09-19 DIAGNOSIS — J441 Chronic obstructive pulmonary disease with (acute) exacerbation: Secondary | ICD-10-CM | POA: Diagnosis present

## 2015-09-19 DIAGNOSIS — Z96642 Presence of left artificial hip joint: Secondary | ICD-10-CM | POA: Diagnosis present

## 2015-09-19 DIAGNOSIS — N183 Chronic kidney disease, stage 3 (moderate): Secondary | ICD-10-CM | POA: Diagnosis present

## 2015-09-19 DIAGNOSIS — J181 Lobar pneumonia, unspecified organism: Secondary | ICD-10-CM | POA: Diagnosis present

## 2015-09-19 DIAGNOSIS — I482 Chronic atrial fibrillation: Secondary | ICD-10-CM | POA: Diagnosis present

## 2015-09-19 DIAGNOSIS — E785 Hyperlipidemia, unspecified: Secondary | ICD-10-CM | POA: Diagnosis present

## 2015-09-19 DIAGNOSIS — Z87891 Personal history of nicotine dependence: Secondary | ICD-10-CM

## 2015-09-19 DIAGNOSIS — Z7901 Long term (current) use of anticoagulants: Secondary | ICD-10-CM | POA: Diagnosis not present

## 2015-09-19 DIAGNOSIS — F419 Anxiety disorder, unspecified: Secondary | ICD-10-CM | POA: Diagnosis present

## 2015-09-19 DIAGNOSIS — J440 Chronic obstructive pulmonary disease with acute lower respiratory infection: Secondary | ICD-10-CM | POA: Diagnosis present

## 2015-09-19 DIAGNOSIS — J9601 Acute respiratory failure with hypoxia: Secondary | ICD-10-CM | POA: Diagnosis present

## 2015-09-19 DIAGNOSIS — I272 Other secondary pulmonary hypertension: Secondary | ICD-10-CM | POA: Diagnosis present

## 2015-09-19 DIAGNOSIS — I130 Hypertensive heart and chronic kidney disease with heart failure and stage 1 through stage 4 chronic kidney disease, or unspecified chronic kidney disease: Secondary | ICD-10-CM | POA: Diagnosis present

## 2015-09-19 DIAGNOSIS — Z981 Arthrodesis status: Secondary | ICD-10-CM

## 2015-09-19 DIAGNOSIS — I5033 Acute on chronic diastolic (congestive) heart failure: Secondary | ICD-10-CM | POA: Diagnosis present

## 2015-09-19 DIAGNOSIS — Z8673 Personal history of transient ischemic attack (TIA), and cerebral infarction without residual deficits: Secondary | ICD-10-CM | POA: Diagnosis not present

## 2015-09-19 DIAGNOSIS — N390 Urinary tract infection, site not specified: Secondary | ICD-10-CM | POA: Diagnosis present

## 2015-09-19 DIAGNOSIS — M545 Low back pain: Secondary | ICD-10-CM | POA: Diagnosis present

## 2015-09-19 DIAGNOSIS — I4891 Unspecified atrial fibrillation: Secondary | ICD-10-CM | POA: Diagnosis not present

## 2015-09-19 DIAGNOSIS — G8929 Other chronic pain: Secondary | ICD-10-CM | POA: Diagnosis present

## 2015-09-19 DIAGNOSIS — Z7982 Long term (current) use of aspirin: Secondary | ICD-10-CM

## 2015-09-19 DIAGNOSIS — I4821 Permanent atrial fibrillation: Secondary | ICD-10-CM | POA: Diagnosis present

## 2015-09-19 DIAGNOSIS — Z79899 Other long term (current) drug therapy: Secondary | ICD-10-CM

## 2015-09-19 LAB — CBC WITH DIFFERENTIAL/PLATELET
BASOS ABS: 0 10*3/uL (ref 0.0–0.1)
BASOS PCT: 0 %
EOS ABS: 0 10*3/uL (ref 0.0–0.7)
Eosinophils Relative: 0 %
HEMATOCRIT: 28 % — AB (ref 39.0–52.0)
Hemoglobin: 9 g/dL — ABNORMAL LOW (ref 13.0–17.0)
Lymphocytes Relative: 3 %
Lymphs Abs: 0.5 10*3/uL — ABNORMAL LOW (ref 0.7–4.0)
MCH: 25.6 pg — ABNORMAL LOW (ref 26.0–34.0)
MCHC: 32.1 g/dL (ref 30.0–36.0)
MCV: 79.5 fL (ref 78.0–100.0)
MONO ABS: 1.3 10*3/uL — AB (ref 0.1–1.0)
MONOS PCT: 8 %
NEUTROS ABS: 14.3 10*3/uL — AB (ref 1.7–7.7)
Neutrophils Relative %: 89 %
PLATELETS: 247 10*3/uL (ref 150–400)
RBC: 3.52 MIL/uL — ABNORMAL LOW (ref 4.22–5.81)
RDW: 15.1 % (ref 11.5–15.5)
WBC: 16.1 10*3/uL — ABNORMAL HIGH (ref 4.0–10.5)

## 2015-09-19 LAB — BASIC METABOLIC PANEL
ANION GAP: 10 (ref 5–15)
Anion gap: 9 (ref 5–15)
BUN: 31 mg/dL — AB (ref 6–20)
BUN: 31 mg/dL — AB (ref 6–20)
CALCIUM: 9.4 mg/dL (ref 8.9–10.3)
CHLORIDE: 98 mmol/L — AB (ref 101–111)
CHLORIDE: 93 mmol/L — AB (ref 101–111)
CO2: 23 mmol/L (ref 22–32)
CO2: 23 mmol/L (ref 22–32)
CREATININE: 1.33 mg/dL — AB (ref 0.61–1.24)
Calcium: 8.8 mg/dL — ABNORMAL LOW (ref 8.9–10.3)
Creatinine, Ser: 1.38 mg/dL — ABNORMAL HIGH (ref 0.61–1.24)
GFR calc Af Amer: 52 mL/min — ABNORMAL LOW (ref >60)
GFR, EST AFRICAN AMERICAN: 54 mL/min — AB (ref >60)
GFR, EST NON AFRICAN AMERICAN: 47 mL/min — AB (ref >60)
GFR, EST NON AFRICAN AMERICAN: 45 mL/min — AB (ref >60)
GLUCOSE: 193 mg/dL — AB (ref 65–99)
Glucose, Bld: 212 mg/dL — ABNORMAL HIGH (ref 65–99)
POTASSIUM: 3.7 mmol/L (ref 3.5–5.1)
Potassium: 4.1 mmol/L (ref 3.5–5.1)
SODIUM: 130 mmol/L — AB (ref 135–145)
Sodium: 126 mmol/L — ABNORMAL LOW (ref 135–145)

## 2015-09-19 LAB — I-STAT ARTERIAL BLOOD GAS, ED
ACID-BASE DEFICIT: 1 mmol/L (ref 0.0–2.0)
BICARBONATE: 22.3 meq/L (ref 20.0–24.0)
O2 Saturation: 93 %
PH ART: 7.47 — AB (ref 7.350–7.450)
TCO2: 23 mmol/L (ref 0–100)
pCO2 arterial: 30.6 mmHg — ABNORMAL LOW (ref 35.0–45.0)
pO2, Arterial: 60 mmHg — ABNORMAL LOW (ref 80.0–100.0)

## 2015-09-19 LAB — GLUCOSE, CAPILLARY
GLUCOSE-CAPILLARY: 195 mg/dL — AB (ref 65–99)
GLUCOSE-CAPILLARY: 242 mg/dL — AB (ref 65–99)
GLUCOSE-CAPILLARY: 179 mg/dL — AB (ref 65–99)
Glucose-Capillary: 267 mg/dL — ABNORMAL HIGH (ref 65–99)

## 2015-09-19 LAB — I-STAT CHEM 8, ED
BUN: 30 mg/dL — AB (ref 6–20)
CALCIUM ION: 1.17 mmol/L (ref 1.12–1.23)
CREATININE: 1.2 mg/dL (ref 0.61–1.24)
Chloride: 97 mmol/L — ABNORMAL LOW (ref 101–111)
Glucose, Bld: 213 mg/dL — ABNORMAL HIGH (ref 65–99)
HEMATOCRIT: 28 % — AB (ref 39.0–52.0)
HEMOGLOBIN: 9.5 g/dL — AB (ref 13.0–17.0)
Potassium: 4.1 mmol/L (ref 3.5–5.1)
SODIUM: 132 mmol/L — AB (ref 135–145)
TCO2: 22 mmol/L (ref 0–100)

## 2015-09-19 LAB — I-STAT TROPONIN, ED: TROPONIN I, POC: 0.01 ng/mL (ref 0.00–0.08)

## 2015-09-19 LAB — TSH: TSH: 1.485 u[IU]/mL (ref 0.350–4.500)

## 2015-09-19 LAB — PROCALCITONIN: PROCALCITONIN: 0.38 ng/mL

## 2015-09-19 LAB — BRAIN NATRIURETIC PEPTIDE: B NATRIURETIC PEPTIDE 5: 390.8 pg/mL — AB (ref 0.0–100.0)

## 2015-09-19 LAB — MRSA PCR SCREENING: MRSA BY PCR: POSITIVE — AB

## 2015-09-19 MED ORDER — PREDNISONE 20 MG PO TABS
40.0000 mg | ORAL_TABLET | Freq: Every day | ORAL | Status: DC
  Administered 2015-09-19 – 2015-09-21 (×3): 40 mg via ORAL
  Filled 2015-09-19 (×3): qty 2

## 2015-09-19 MED ORDER — INSULIN ASPART 100 UNIT/ML ~~LOC~~ SOLN
0.0000 [IU] | Freq: Three times a day (TID) | SUBCUTANEOUS | Status: DC
  Administered 2015-09-19: 5 [IU] via SUBCUTANEOUS
  Administered 2015-09-19: 3 [IU] via SUBCUTANEOUS
  Administered 2015-09-19 – 2015-09-20 (×3): 8 [IU] via SUBCUTANEOUS
  Administered 2015-09-20: 2 [IU] via SUBCUTANEOUS
  Administered 2015-09-21: 3 [IU] via SUBCUTANEOUS

## 2015-09-19 MED ORDER — DEXTROSE 5 % IV SOLN
2.0000 g | Freq: Once | INTRAVENOUS | Status: DC
  Filled 2015-09-19: qty 2

## 2015-09-19 MED ORDER — ACETAMINOPHEN 650 MG RE SUPP: 650.0000 mg | Freq: Four times a day (QID) | RECTAL | Status: DC | PRN

## 2015-09-19 MED ORDER — PRAVASTATIN SODIUM 40 MG PO TABS
80.0000 mg | ORAL_TABLET | Freq: Every day | ORAL | Status: DC
  Administered 2015-09-19 – 2015-09-21 (×3): 80 mg via ORAL
  Filled 2015-09-19 (×2): qty 2
  Filled 2015-09-19: qty 4

## 2015-09-19 MED ORDER — DILTIAZEM HCL ER COATED BEADS 240 MG PO CP24
240.0000 mg | ORAL_CAPSULE | Freq: Every day | ORAL | Status: DC
  Administered 2015-09-19 – 2015-09-21 (×3): 240 mg via ORAL
  Filled 2015-09-19 (×3): qty 1

## 2015-09-19 MED ORDER — METOPROLOL TARTRATE 25 MG PO TABS
25.0000 mg | ORAL_TABLET | Freq: Two times a day (BID) | ORAL | Status: DC
  Administered 2015-09-19 – 2015-09-21 (×5): 25 mg via ORAL
  Filled 2015-09-19 (×6): qty 1

## 2015-09-19 MED ORDER — LEVOTHYROXINE SODIUM 50 MCG PO TABS
50.0000 ug | ORAL_TABLET | Freq: Every day | ORAL | Status: DC
Start: 2015-09-19 — End: 2015-09-21
  Administered 2015-09-19 – 2015-09-21 (×3): 50 ug via ORAL
  Filled 2015-09-19 (×3): qty 1

## 2015-09-19 MED ORDER — INSULIN GLARGINE 100 UNIT/ML ~~LOC~~ SOLN
15.0000 [IU] | Freq: Every day | SUBCUTANEOUS | Status: DC
  Administered 2015-09-19 – 2015-09-20 (×2): 15 [IU] via SUBCUTANEOUS
  Filled 2015-09-19 (×3): qty 0.15

## 2015-09-19 MED ORDER — FUROSEMIDE 10 MG/ML IJ SOLN
40.0000 mg | Freq: Every day | INTRAMUSCULAR | Status: DC
  Administered 2015-09-20 – 2015-09-21 (×2): 40 mg via INTRAVENOUS
  Filled 2015-09-19 (×2): qty 4

## 2015-09-19 MED ORDER — FUROSEMIDE 10 MG/ML IJ SOLN
80.0000 mg | Freq: Once | INTRAMUSCULAR | Status: AC
  Administered 2015-09-19: 80 mg via INTRAVENOUS
  Filled 2015-09-19: qty 8

## 2015-09-19 MED ORDER — DEXTROSE 5 % IV SOLN
500.0000 mg | Freq: Once | INTRAVENOUS | Status: DC
  Filled 2015-09-19: qty 500

## 2015-09-19 MED ORDER — ASPIRIN EC 81 MG PO TBEC
81.0000 mg | DELAYED_RELEASE_TABLET | Freq: Every day | ORAL | Status: DC
  Administered 2015-09-19 – 2015-09-21 (×3): 81 mg via ORAL
  Filled 2015-09-19 (×3): qty 1

## 2015-09-19 MED ORDER — INSULIN ASPART 100 UNIT/ML ~~LOC~~ SOLN
0.0000 [IU] | Freq: Every day | SUBCUTANEOUS | Status: DC
  Administered 2015-09-20: 4 [IU] via SUBCUTANEOUS

## 2015-09-19 MED ORDER — FERROUS SULFATE 325 (65 FE) MG PO TABS
325.0000 mg | ORAL_TABLET | Freq: Every day | ORAL | Status: DC
  Administered 2015-09-19 – 2015-09-21 (×3): 325 mg via ORAL
  Filled 2015-09-19 (×3): qty 1

## 2015-09-19 MED ORDER — LEVOFLOXACIN IN D5W 750 MG/150ML IV SOLN: 750.0000 mg | INTRAVENOUS | Status: DC

## 2015-09-19 MED ORDER — MUPIROCIN 2 % EX OINT
1.0000 "application " | TOPICAL_OINTMENT | Freq: Two times a day (BID) | CUTANEOUS | Status: DC
  Administered 2015-09-19 – 2015-09-21 (×4): 1 via NASAL
  Filled 2015-09-19 (×2): qty 22

## 2015-09-19 MED ORDER — DOCUSATE SODIUM 100 MG PO CAPS
100.0000 mg | ORAL_CAPSULE | Freq: Two times a day (BID) | ORAL | Status: DC
  Administered 2015-09-19 – 2015-09-21 (×5): 100 mg via ORAL
  Filled 2015-09-19 (×5): qty 1

## 2015-09-19 MED ORDER — NITROGLYCERIN 0.4 MG SL SUBL: 0.4000 mg | SUBLINGUAL_TABLET | SUBLINGUAL | Status: DC

## 2015-09-19 MED ORDER — LEVOFLOXACIN IN D5W 750 MG/150ML IV SOLN
750.0000 mg | Freq: Once | INTRAVENOUS | Status: AC
  Administered 2015-09-19: 750 mg via INTRAVENOUS
  Filled 2015-09-19: qty 150

## 2015-09-19 MED ORDER — ACETAMINOPHEN 325 MG PO TABS
650.0000 mg | ORAL_TABLET | Freq: Four times a day (QID) | ORAL | Status: DC | PRN
  Administered 2015-09-19 – 2015-09-20 (×2): 650 mg via ORAL
  Filled 2015-09-19 (×2): qty 2

## 2015-09-19 MED ORDER — LISINOPRIL 20 MG PO TABS
20.0000 mg | ORAL_TABLET | Freq: Every day | ORAL | Status: DC
  Administered 2015-09-19 – 2015-09-21 (×3): 20 mg via ORAL
  Filled 2015-09-19 (×3): qty 1

## 2015-09-19 MED ORDER — NITROGLYCERIN 0.4 MG SL SUBL
0.4000 mg | SUBLINGUAL_TABLET | SUBLINGUAL | Status: DC | PRN
  Administered 2015-09-19 – 2015-09-20 (×2): 0.4 mg via SUBLINGUAL
  Filled 2015-09-19 (×2): qty 1
  Filled 2015-09-19: qty 25

## 2015-09-19 MED ORDER — IPRATROPIUM-ALBUTEROL 0.5-2.5 (3) MG/3ML IN SOLN: 3.0000 mL | RESPIRATORY_TRACT | Status: DC | PRN

## 2015-09-19 MED ORDER — DABIGATRAN ETEXILATE MESYLATE 150 MG PO CAPS
150.0000 mg | ORAL_CAPSULE | Freq: Two times a day (BID) | ORAL | Status: DC
  Administered 2015-09-19 – 2015-09-21 (×5): 150 mg via ORAL
  Filled 2015-09-19 (×5): qty 1

## 2015-09-19 MED ORDER — CHLORHEXIDINE GLUCONATE CLOTH 2 % EX PADS
6.0000 | MEDICATED_PAD | Freq: Every day | CUTANEOUS | Status: DC
  Administered 2015-09-20 – 2015-09-21 (×2): 6 via TOPICAL

## 2015-09-19 NOTE — ED Notes (Signed)
Report attempted for 2nd time, CN state, RN will call back for report since she is on isolation.

## 2015-09-19 NOTE — ED Notes (Addendum)
Family at the bedside.

## 2015-09-19 NOTE — H&P (Signed)
History and Physical  Patient Name: Frank Marks     ZOX:[id number]    DOB: Sep 07, 1926    DOA: 09/19/2015 PCP: Ailene RavelHAMRICK,MAURA L, MD   Patient coming from: Home  Chief Complaint: Dyspnea and respiratory distress  HPI: Frank Marks is a 82 y.o. male with a past medical history significant for COPD, chronic diastolic CHF, recent UTI, Afib on Pradaxa, and IDDM who presents with respiratory difficulty.  The patient has been chronically short of breath for months now, but tonight his son and daughter in law thought his breathing was substantially worse, he was breathing rapidly and had more-than-usual weakness and cough with some yellowish sputum and so called EMS.    ED course: -Afebrile, heart rate in Afib and between 70s and 100s, breathing >40 times per minute, oxygen saturation 86% on room air -Na 130, K 4.1, Cr 1.33 (baseline 1.1-1.4), WBC 16.1K, Hgb 9.0 -ABG showed pH 7.47, pCO2 30 and pO2 60 -CXR showed bilateral patchy opacities consistent with CHF and pneumonia and so he was given furosemide 80 mg IV and ceftriaxone and azithromycin and TRH were asked to evaluate for admission.  The patient has been dealing with chronic dyspnea for months now, has seen his Cardiology and tried on furosemide without improvement, seen his Pulmonologist and worked up for pulmonary effusion which was thought not to contribute to his dyspnea.  Ultimately, this was thought to be due to chronic lung disease, diastolic CHF, and deconditioning.  He did have a HRCT in 2016 August that showed "pattern of very mild patchy subpleural reticulation in the lungs bilaterally which is nonspecific, but could be a very early manifestation of interstitial lung disease. Repeat high-resolution chest CT is recommended in 12 months to assess for further temporal changes in the appearance of the lung parenchyma if clinically appropriate".  Of note, the patient developed foul-smelling urine and urinary frequency this week and was  started on cefuroxime for UTI by his PCP.  He took three doses and then claimed that "his tongue swelled up" and so he stopped this and his PCP called in another prescription (family not sure what) that they hadn't picked up yet.          ROS: Review of Systems  Constitutional: Positive for malaise/fatigue. Negative for chills, diaphoresis, fever and weight loss.  HENT: Negative.   Eyes: Negative.   Respiratory: Positive for cough, sputum production and shortness of breath. Negative for hemoptysis and wheezing.   Cardiovascular: Negative for chest pain, palpitations, orthopnea, claudication, leg swelling and PND.  Gastrointestinal: Negative.   Genitourinary: Positive for frequency and urgency. Negative for dysuria, flank pain and hematuria.  Musculoskeletal: Negative.   Skin: Negative.   Neurological: Positive for weakness. Negative for dizziness, tingling, tremors, sensory change, speech change, focal weakness, seizures and loss of consciousness.  Endo/Heme/Allergies: Negative.   Psychiatric/Behavioral: Negative.        Past Medical History:  Diagnosis Date  . Acute on chronic diastolic CHF (congestive heart failure), NYHA class 3 (HCC) 07/29/2014  . Anxiety    short periods of nervousness, reported since need for heart care, he feels anxious on & off.    . Aortic stenosis    a. s/p bioprosthetic AVR 9/16.  b. Echo 11/16: Mild LVH, EF 50-55%, normal wall motion, indeterminate diastolic function, AVR okay with mean gradient 7 mmHg and no regurgitation, borderline dilated aortic root (38 mm), mildly dilated ascending aorta (40 mm), MAC, mild MR, severe LAE, mildly reduced RVSF, mild to  moderate RAE, PASP 46 mmHg, left sided pleural effusion  . Arthritis    "back" (07/29/2014)  . Carotid artery stenosis   . Chronic atrial fibrillation (HCC)   . Chronic back pain   . Chronic lower back pain   . COPD (chronic obstructive pulmonary disease) (HCC)   . Dysrhythmia    afib  . Hx of  cardiovascular stress test    Lex MV 3/14:  Not gated, apical thinning, no ischemia.   Marland Kitchen Hx of echocardiogram 2014   Echo 04/24/12: mild LVH, EF 50-55%, mild AS (mean 9), MAC, mild MR, mod-severe BAE, mod TR, PASP 39  . Hypercholesterolemia   . Hypertension   . Osteoarthritis   . Pneumonia ~ 1951; 02/2014  . Stroke (HCC)    ministroke- 2xs  . TIA (transient ischemic attack) 06/2007   "had 2-3 little ones around that time"  . Type II diabetes mellitus (HCC)     Past Surgical History:  Procedure Laterality Date  . AORTIC VALVE REPLACEMENT N/A 10/29/2014   Procedure: AORTIC VALVE REPLACEMENT (AVR);  Surgeon: [name], MD;  [facility];  Service: Open Heart Surgery;  Laterality: N/A;  . APPENDECTOMY    . BACK SURGERY     x2   . CARDIAC CATHETERIZATION  07/29/2014  . CARDIAC CATHETERIZATION N/A 07/29/2014   Procedure: Left Heart Cath and Coronary Angiography;  Surgeon: [name], MD;  Location: [facility] INVASIVE CV LAB;  Service: Cardiovascular;  Laterality: N/A;  . CATARACT EXTRACTION W/ INTRAOCULAR LENS  IMPLANT, BILATERAL Bilateral   . CORONARY ARTERY BYPASS GRAFT N/A 10/29/2014   Procedure: CORONARY ARTERY BYPASS GRAFTING (CABG);  Surgeon: [name], MD;  Location[facility] OR;  Service: Open Heart Surgery;  Laterality: N/A;  . EXPLORATION POST OPERATIVE OPEN HEART N/A 10/29/2014   Procedure: EXPLORATION POST OPERATIVE OPEN HEART;  Surgeon: [name], MD;  Location: [facility] OR;  Service: Open Heart Surgery;  Laterality: N/A;  . EYE SURGERY     cataracts - removed- bilateral, /w IOL  . JOINT REPLACEMENT    . KNEE ARTHROSCOPY Left   . LUMBAR LAMINECTOMY/DECOMPRESSION MICRODISCECTOMY  1990's X 1; 2000's X1  . REPLACEMENT TOTAL KNEE Bilateral ~ 2001-2002  . TEE WITHOUT CARDIOVERSION N/A 10/29/2014   Procedure: TRANSESOPHAGEAL ECHOCARDIOGRAM (TEE);  Surgeon: [name], MD;  Location: [facility] OR;  Service: Open Heart Surgery;  Laterality: N/A;  . TONSILLECTOMY    . TOTAL HIP  ARTHROPLASTY Left ~ 2003    Social History: Patient lives by himself but family stay with him during the day.  The patient walks with a cane.  He needs help with the bathroom chronically, but this week has needed help with dressing and all other ADLs because he has been so weak with his UTI.  Remote former smoker.  Used to work in [employer].  No Known Allergies  Family history: family history includes Alzheimer's disease in his mother; Arthritis in his mother; Healthy in his mother; Stroke in his father.  Prior to Admission medications   Medication Sig Start Date End Date Taking? Authorizing Provider  aspirin 81 MG tablet Take 81 mg by mouth daily.    Historical Provider, MD  dabigatran (PRADAXA) 150 MG CAPS capsule Take 1 capsule (150 mg total) by mouth 2 (two) times daily. 02/01/15   [name], MD  diltiazem (CARDIZEM CD) 240 MG 24 hr capsule TAKE 1 CAPSULE (240 MG TOTAL) BY MOUTH DAILY. 03/29/15   [name], MD  docusate  sodium (COLACE) 100 MG capsule Take 1 capsule (100 mg total) by mouth every 12 (twelve) hours. 03/27/15   [name], DO  ferrous sulfate 325 (65 FE) MG tablet Take 1 tablet (325 mg total) by mouth daily with breakfast. For one month 11/10/14   [name], PA-C  furosemide (LASIX) 20 MG tablet Take one tablet by mouth daily, if no improvement in symptoms in 1 week please stop medication 08/19/15   [name], MD  Insulin Glargine (LANTUS SOLOSTAR) 100 UNIT/ML Solostar Pen Inject 20 Units into the skin daily at 10 pm.    Historical Provider, MD  levothyroxine (SYNTHROID, LEVOTHROID) 50 MCG tablet Take 50 mcg by mouth every morning. 07/27/15   Historical Provider, MD  lisinopril (PRINIVIL,ZESTRIL) 20 MG tablet Take 20 mg by mouth daily. 01/04/15   Historical Provider, MD  metFORMIN (GLUCOPHAGE) 500 MG tablet Take 500 mg by mouth 2 (two) times daily with a meal.    Historical Provider, MD  metoprolol tartrate (LOPRESSOR) 25 MG tablet TAKE 1 TABLET (25  MG TOTAL) BY MOUTH 2 (TWO) TIMES DAILY. 08/25/15   [name], PA-C  NITROSTAT 0.4 MG SL tablet Place 0.4 mg under the tongue every 5 (five) minutes as needed for chest pain. Limit 3 tabs per day 09/04/14   Historical Provider, MD  pravastatin (PRAVACHOL) 80 MG tablet Take 80 mg by mouth daily.    Historical Provider, MD       Physical Exam: BP 121/71   Pulse 76   Temp 97.3 F (36.3 C) (Oral)   Resp (!) 37   Ht 5\' 10"  (1.778 m)   Wt 79.7 kg (175 lb 12.8 oz)   SpO2 95%   BMI 25.22 kg/m  General appearance: Thin elderly frail adult male, alert and in no acute distress. Very HOH.   Eyes: Anicteric, conjunctiva pink, lids and lashes normal.     ENT: No nasal deformity, discharge, or epistaxis.  OP moist without lesions.  Edentulous. Skin: Warm and moist.  No suspicious rashes or lesions. Cardiac: Tachycardic, irregular, nl S1-S2.  Capillary refill is brisk.  No JVD.  Minimal trace ankle edema.  Radial pulses 2+ and symmetric. Respiratory: Breathing very rapidly but actually appears comfortable.  Diminished breath sounds bilaterally.  No wheezes.  Coarse sounds at right side. GI: Abdomen soft without rigidity.  No TTP. No ascites, distension, hepatosplenomegaly.   MSK: No deformities or effusions.  No clubbing/cyanosis. Neuro: Cranial nerves normal except HOH.  Sensation normal to light touch.  Sensorium intact and responding to questions, attention normal.  Speech is fluent.  Moves all extremities equally and with normal coordination.    Psych: Affect normal.  Judgment and insight appear normal.       Labs on Admission:  I have personally reviewed following labs and imaging studies: CBC:  Recent Labs Lab 09/19/15 0314 09/19/15 0319  WBC 16.1*  --   NEUTROABS 14.3*  --   HGB 9.0* 9.5*  HCT 28.0* 28.0*  MCV 79.5  --   PLT 247  --    Basic Metabolic Panel:  Recent Labs Lab 09/19/15 0314 09/19/15 0319  NA 130* 132*  K 4.1 4.1  CL 98* 97*  CO2 23  --   GLUCOSE 212*  213*  BUN 31* 30*  CREATININE 1.33* 1.20  CALCIUM 9.4  --    GFR: Estimated Creatinine Clearance: 45.6 mL/min (by C-G formula based on SCr of 1.2 mg/dL).       Radiological  Exams on Admission: Personally reviewed: Dg Chest Port 1 View  Result Date: 09/19/2015 CLINICAL DATA:  81 year old male with shortness of breath EXAM: PORTABLE CHEST 1 VIEW COMPARISON:  Chest radiograph dated 09/07/2015 FINDINGS: Single portable view of the chest demonstrate diffuse interstitial prominence compatible with congestive changes and mild pulmonary edema. There is a small left pleural effusion. Left lung base atelectatic changes. Pneumonia is not excluded. There is no pneumothorax. Mild cardiomegaly. Median sternotomy wires and cardiac mechanical valve. No acute osseous pathology. IMPRESSION: Interval development of congestive changes with mild interstitial edema and small left pleural effusion. Pneumonia is not excluded. Clinical correlation and follow-up recommended. Electronically Signed   By: Elgie Collard M.D.   On: 09/19/2015 03:29    EKG: Independently reviewed. Rate 103, atrial flutter.    Assessment/Plan 1. Acute respiratory failure with hypoxia:  Suspect this is multifactorial given COPD/pneumonia, CHF, and age.  Doubt ILD, but HRCT findings from last year suggest repeat CT, probably after this acute illness, would be warranted.  Meets SIRS/early warnings signs criteria without organ failure.  Blood and urine cultures drawn, antibiotics delivered in the ED for possible pneumonia. -Levaquin IV -Check procalcitonin and de-escalate if able -Prednisone 40 mg for 5 days -Furosemide 80 mg IV given in ER   2. Atrial fibrillation:  CHADS2Vasc 6.  On Pradaxa and rate control. -Continue dabigatran -Continue diltiazem and metoprolol  3. Hyponatremia:  Chronic, stable.  4. Acute on chronic diastolic CHF:  EF normal.  Not on furosemide at home.  BNP mid-range. -Furosemide once in the ER -Repeat  BMP at noon -Daily BMP -Strict I/Os, and re-dose if still dyspneic.  5. UTI:  -Levaquin IV  -Urine culture at [facility] -Repeat urine culture here is after antibiotics  6. HTN:  -Continue metoprolol, diltiazem, lisinopril  7. COPD:  Not on inhalers at home.  FEV1 69% in 2016. -Albuterol PRN     DVT prophylaxis: Pradaxa  Code Status: FULL  Family Communication: Son and daughter in law at bedside  Disposition Plan: Anticipate antibiotics and diuresis.   Consults called: None Admission status: OBS, tele At the point of initial evaluation, it is my clinical opinion that admission for OBSERVATION is reasonable and necessary because the patient's presenting complaints in the context of their chronic conditions represent sufficient risk of deterioration or significant morbidity to constitute reasonable grounds for close observation in the hospital setting, but that the patient may be medically stable for discharge from the hospital within 24 to 48 hours.    Medical decision making: Patient seen at 4:29 AM on 09/19/2015.  The patient was discussed with Dr. Mora Bellman. What exists of the patient's chart was reviewed in depth.  Clinical condition: requiring only supplemental oxygen to maintain O2 saturation in normal range, otherwise hemodynamically stable and alert.        Alberteen Sam [facility] Pager [phone]

## 2015-09-19 NOTE — Progress Notes (Signed)
Patient ID: Frank Marks, male   DOB: 1926/07/16, 82 y.o.   MRN: [id number] Pt admitted after midnight. For details, please refer to admission note done 09/19/2015.  82 y.o. male with a past medical history significant for COPD, chronic diastolic CHF, recent UTI, Afib on Pradaxa, IDDM who presented to [facility] with respiratory difficulty and cough productive of yellowish sputum. His symptoms have started about a month PTA but have progressively worsened especially in past 2-3 days PTA. In ED, pt was afebrile, HR 70-100, RR up to 40, O2 sat 86% on room air which has improved to 90's with [name] oxygen support. CXR showed bilateral patchy opacities consistent with CHF and pneumonia. He was given lasix 80 mg IV and azithro/rocephin.   Assessment and Plan:  Acute respiratory failure with hypoxia / Acute COPD exacerbation - Use duoneb every 4 hours as needed for shortness of breath or wheezing - Continue oxygen support via [place] to keep O2 sat above 90% - Continue daily prednisone  Acute lobar pneumonia - Continue Levaquin daily  [facility] [phone]

## 2015-09-19 NOTE — ED Triage Notes (Signed)
Pt brought to ED by EMS from home for SOB, pt was diagnosed with UTI last Friday and taking abx at [facility] for UTI, SPO2 of 80% on RA at EMS arrival pt placed on NRM with SPO2 of 98, Tachy on monitor. VS for EMS Temp 97.8 oral, BP 122/68, HR 95 CBG 250.

## 2015-09-19 NOTE — Progress Notes (Signed)
SATURATION QUALIFICATIONS: (This note is used to comply with regulatory documentation for home oxygen)  Patient Saturations on Room Air at Rest = 92%  Patient Saturations on Room Air while Ambulating = 87%  Patient Saturations on 3 Liters of oxygen while Ambulating = 98%  Please briefly explain why patient needs home oxygen:  Patient with O2 desaturation with ambulation on room air.  Requiring O2 to maintain O2 sats above 88% with mobility.  Durenda HurtSusan H. Renaldo Fiddleravis, PT, [facility] Acute Rehab Services Pager [phone]

## 2015-09-19 NOTE — ED Notes (Signed)
hospitalist at the bedside 

## 2015-09-19 NOTE — Evaluation (Signed)
Physical Therapy Evaluation Patient Details Name: Frank Marks MRN: [id number] DOB: 19-Jul-1926 Today's Date: 09/19/2015   History of Present Illness  Patient is an 82 yo male admitted 09/19/15 with acute respiratory failure with hypoxia.   PMH:  COPD, CHF, recent UTI, Afib, DM  Clinical Impression  Patient presents with problems listed below.  Will benefit from acute PT to maximize functional mobility prior to discharge home with 24 hour assist.  Attempted mobility on room air - ambulated 40' and O2 sats dropped to 87%.  Reapplied O2 and sats returned to 98%.  Patient with general weakness and decreased balance, impacting mobility and gait.  Recommend f/u HHPT at discharge for continued therapy.      Follow Up Recommendations Home health PT;Supervision for mobility/OOB    Equipment Recommendations  Wheelchair (measurements PT);Wheelchair cushion (measurements PT)    Recommendations for Other Services       Precautions / Restrictions Precautions Precautions: Fall Restrictions Weight Bearing Restrictions: No      Mobility  Bed Mobility Overal bed mobility: Needs Assistance Bed Mobility: Supine to Sit;Sit to Supine     Supine to sit: Min assist;HOB elevated Sit to supine: Min assist;HOB elevated   General bed mobility comments: Verbal cues to move to EOB.  Assist to raise trunk to upright position.  Good sitting balance.  Assist to bring LE's onto bed to return to supine.  Transfers Overall transfer level: Needs assistance Equipment used: Rolling walker (2 wheeled) Transfers: Sit to/from Stand Sit to Stand: Min assist         General transfer comment: Verbal cues for hand placement.  Assist to power up to standing.  Ambulation/Gait Ambulation/Gait assistance: Min guard Ambulation Distance (Feet): 40 Feet Assistive device: Rolling walker (2 wheeled) Gait Pattern/deviations: Step-through pattern;Decreased step length - right;Decreased step length - left;Decreased  stride length;Trunk flexed Gait velocity: decreased   General Gait Details: Verbal cues to stand upright during gait.  Patient with slow, shuffling steps with flexed posture.  Attempted gait on room air with O2 sats dropping to 87%.  Reapplied O2 and sats returned to 98%.  Stairs            Wheelchair Mobility    Modified Rankin (Stroke Patients Only)       Balance Overall balance assessment: Needs assistance Sitting-balance support: No upper extremity supported;Feet supported Sitting balance-Leahy Scale: Fair     Standing balance support: Bilateral upper extremity supported Standing balance-Leahy Scale: Poor                               Pertinent Vitals/Pain Pain Assessment: No/denies pain   O2 sats at rest on O2 at 3 l/min:  98% O2 sats at rest on room air:  92% O2 sats with gait on room air:  87% O2 sats with reapplication of O2 at 3 l/min:  98%    Home Living Family/patient expects to be discharged to:: Private residence Living Arrangements: Alone Available Help at Discharge: Personal care attendant;Available 24 hours/day;Family Type of Home: House Home Access: Ramped entrance     Home Layout: One level Home Equipment: Walker - 2 wheels;Cane - single point;Shower seat;Toilet riser Additional Comments: Patient has sitters 24/7.  Two sons also help out.    Prior Function Level of Independence: Independent with assistive device(s);Needs assistance   Gait / Transfers Assistance Needed: Uses RW or cane for ambulation household distances.  ADL's / Homemaking Assistance Needed: Aides  assist patient with bathing, dressing, meal prep, laundry, housekeeping.        Hand Dominance        Extremity/Trunk Assessment   Upper Extremity Assessment: Generalized weakness           Lower Extremity Assessment: Generalized weakness      Cervical / Trunk Assessment: Kyphotic  Communication   Communication: HOH (Hearing aid)  Cognition  Arousal/Alertness: Awake/alert Behavior During Therapy: WFL for tasks assessed/performed Overall Cognitive Status: Within Functional Limits for tasks assessed       Memory: Decreased short-term memory              General Comments      Exercises        Assessment/Plan    PT Assessment Patient needs continued PT services  PT Diagnosis Difficulty walking;Generalized weakness   PT Problem List Decreased strength;Decreased activity tolerance;Decreased balance;Decreased mobility;Cardiopulmonary status limiting activity  PT Treatment Interventions DME instruction;Gait training;Functional mobility training;Therapeutic activities;Therapeutic exercise;Patient/family education   PT Goals (Current goals can be found in the Care Plan section) Acute Rehab PT Goals Patient Stated Goal: To go home soon PT Goal Formulation: With patient/family Time For Goal Achievement: 09/26/15 Potential to Achieve Goals: Good    Frequency Min 3X/week   Barriers to discharge        Co-evaluation               End of Session Equipment Utilized During Treatment: Gait belt;Oxygen Activity Tolerance: Patient limited by fatigue;Treatment limited secondary to medical complications (Comment) (Decreased O2 sats) Patient left: in bed;with call bell/phone within reach;with bed alarm set;with family/visitor present Nurse Communication: Mobility status (O2 sats with mobility on room air; d/c needs)         Time: 1735-1801 PT Time Calculation (min) (ACUTE ONLY): 26 min   Charges:   PT Evaluation $PT Eval Moderate Complexity: 1 Procedure PT Treatments $Gait Training: 8-22 mins   PT G CodesVena Austria 10-07-15, 7:49 PM Durenda Hurt. Renaldo Fiddler, [facility] Acute Rehab Services Pager [phone]

## 2015-09-19 NOTE — ED Notes (Signed)
Report attempted to 3E, CN states that pt will be sent to another floor. Unable to give report.

## 2015-09-19 NOTE — Progress Notes (Signed)
Pharmacy Antibiotic Note  Frank PlantsClyde D Marks is a 82 y.o. male admitted on 09/19/2015 with UTI.  Pharmacy has been consulted for Levaquin dosing. WBC elevated. CrCL ~45. Other labs reviewed.   Plan: -Levaquin 750 mg IV q48h -Trend WBC, temp, renal function  -F/U urine culture for directed therapy  Height: 5\' 10"  (177.8 cm) Weight: 175 lb 12.8 oz (79.7 kg) IBW/kg (Calculated) : 73  Temp (24hrs), Avg:97.3 F (36.3 C), Min:97.3 F (36.3 C), Max:97.3 F (36.3 C)   Recent Labs Lab 09/19/15 0314 09/19/15 0319  WBC 16.1*  --   CREATININE 1.33* 1.20    Estimated Creatinine Clearance: 45.6 mL/min (by C-G formula based on SCr of 1.2 mg/dL).    Allergies  Allergen Reactions  . Cefuroxime Swelling    Tongue swelling     Abran DukeLedford, Honorio Devol 09/19/2015 6:31 AM

## 2015-09-19 NOTE — ED Provider Notes (Signed)
[facility]-EMERGENCY DEPT Provider Note   CSN: [id number] Arrival date & time:     First Provider Contact:  First MD Initiated Contact with Patient 09/19/15 0240      By signing my name below, I, Freida Busman, attest that this documentation has been prepared under the direction and in the presence of Tomasita Crumble, MD . Electronically Signed: Freida Busman, Scribe. 09/19/2015. 3:07 AM.  History   Chief Complaint Chief Complaint  Patient presents with  . Shortness of Breath    The history is provided by the patient and the EMS personnel. No language interpreter was used.    HPI Comments:  Frank Marks is a 82 y.o. male with a history of CHF, AFIB, and CABG who presents to the Emergency Department via EMS, complaining of difficulty breathing. Pt states he's been short of breath x 3-4 days.EMS notes oxygen saturation was low to mid 80s upon EMS arrival to the patient. Pt arrives to the ED from home; pt lives alone. Pt reports associated productive cough with brown/yellow sputum. He denies CP and fever. Pt was placed on NRB en route with moderate improvement.   Past Medical History:  Diagnosis Date  . Acute on chronic diastolic CHF (congestive heart failure), NYHA class 3 (HCC) 07/29/2014  . Anxiety    short periods of nervousness, reported since need for heart care, he feels anxious on & off.    . Aortic stenosis    a. s/p bioprosthetic AVR 9/16.  b. Echo 11/16: Mild LVH, EF 50-55%, normal wall motion, indeterminate diastolic function, AVR okay with mean gradient 7 mmHg and no regurgitation, borderline dilated aortic root (38 mm), mildly dilated ascending aorta (40 mm), MAC, mild MR, severe LAE, mildly reduced RVSF, mild to moderate RAE, PASP 46 mmHg, left sided pleural effusion  . Arthritis    "back" (07/29/2014)  . Carotid artery stenosis   . Chronic atrial fibrillation (HCC)   . Chronic back pain   . Chronic lower back pain   . COPD (chronic obstructive pulmonary disease) (HCC)   .  Dysrhythmia    afib  . Hx of cardiovascular stress test    Lex MV 3/14:  Not gated, apical thinning, no ischemia.   Marland Kitchen Hx of echocardiogram 2014   Echo 04/24/12: mild LVH, EF 50-55%, mild AS (mean 9), MAC, mild MR, mod-severe BAE, mod TR, PASP 39  . Hypercholesterolemia   . Hypertension   . Osteoarthritis   . Pneumonia ~ 1951; 02/2014  . Stroke (HCC)    ministroke- 2xs  . TIA (transient ischemic attack) 06/2007   "had 2-3 little ones around that time"  . Type II diabetes mellitus [facility])     Patient Active Problem List   Diagnosis Date Noted  . Pleural effusion 06/28/2015  . Aortic valvar stenosis 12/16/2014  . Aortic valve prolapse 12/16/2014  . Aortic valve replaced 12/16/2014  . Unstable angina (HCC) 11/25/2014  . S/P CABG (coronary artery bypass graft) 10/29/2014  . Pulmonary nodules 09/22/2014  . Pulmonary hypertension-PA 50 mmHg 07/30/2014  . Mild aortic stenosis 07/30/2014  . CAD in native artery- severe- medical Rx 07/30/2014  . Chronic anticoagulation-Pradaxa 07/30/2014  . COPD, moderate (HCC) 07/30/2014  . Atrial fibrillation with RVR (HCC) 07/30/2014  . Stroke (HCC)   . Carotid artery stenosis   . Acute on chronic diastolic CHF (congestive heart failure), NYHA class 3 (HCC) 07/29/2014  . Unstable angina pectoris (HCC) 07/29/2014  . CAP (community acquired pneumonia) 02/13/2014  . Essential hypertension  02/13/2014  . UTI (urinary tract infection) 02/13/2014  . Diabetes mellitus type 2 in nonobese (HCC) 02/13/2014  . Pneumonia 02/12/2014  . Occlusion and stenosis of carotid artery with cerebral infarction 06/15/2010  . Permanent atrial fibrillation (HCC) 04/13/2010    Past Surgical History:  Procedure Laterality Date  . AORTIC VALVE REPLACEMENT N/A 10/29/2014   Procedure: AORTIC VALVE REPLACEMENT (AVR);  Surgeon: [name], MD;  Location: [facility] OR;  Service: Open Heart Surgery;  Laterality: N/A;  . APPENDECTOMY    . BACK SURGERY     x2   . CARDIAC  CATHETERIZATION  07/29/2014  . CARDIAC CATHETERIZATION N/A 07/29/2014   Procedure: Left Heart Cath and Coronary Angiography;  Surgeon: [name], MD;  Location: [facility] INVASIVE CV LAB;  Service: Cardiovascular;  Laterality: N/A;  . CATARACT EXTRACTION W/ INTRAOCULAR LENS  IMPLANT, BILATERAL Bilateral   . CORONARY ARTERY BYPASS GRAFT N/A 10/29/2014   Procedure: CORONARY ARTERY BYPASS GRAFTING (CABG);  Surgeon: [name], MD;  Location: [facility] OR;  Service: Open Heart Surgery;  Laterality: N/A;  . EXPLORATION POST OPERATIVE OPEN HEART N/A 10/29/2014   Procedure: EXPLORATION POST OPERATIVE OPEN HEART;  Surgeon: [name], MD;  Location: [facility] OR;  Service: Open Heart Surgery;  Laterality: N/A;  . EYE SURGERY     cataracts - removed- bilateral, /w IOL  . JOINT REPLACEMENT    . KNEE ARTHROSCOPY Left   . LUMBAR LAMINECTOMY/DECOMPRESSION MICRODISCECTOMY  1990's X 1; 2000's X1  . REPLACEMENT TOTAL KNEE Bilateral ~ 2001-2002  . TEE WITHOUT CARDIOVERSION N/A 10/29/2014   Procedure: TRANSESOPHAGEAL ECHOCARDIOGRAM (TEE);  Surgeon: [name], MD;  Location: [facility] OR;  Service: Open Heart Surgery;  Laterality: N/A;  . TONSILLECTOMY    . TOTAL HIP ARTHROPLASTY Left ~ 2003     Home Medications    Prior to Admission medications   Medication Sig Start Date End Date Taking? Authorizing Provider  aspirin 81 MG tablet Take 81 mg by mouth daily.    Historical Provider, MD  dabigatran (PRADAXA) 150 MG CAPS capsule Take 1 capsule (150 mg total) by mouth 2 (two) times daily. 02/01/15   [name], MD  diltiazem (CARDIZEM CD) 240 MG 24 hr capsule TAKE 1 CAPSULE (240 MG TOTAL) BY MOUTH DAILY. 03/29/15   [name], MD  docusate sodium (COLACE) 100 MG capsule Take 1 capsule (100 mg total) by mouth every 12 (twelve) hours. 03/27/15   [name], DO  ferrous sulfate 325 (65 FE) MG tablet Take 1 tablet (325 mg total) by mouth daily with breakfast. For one month 11/10/14   [name], PA-C    furosemide (LASIX) 20 MG tablet Take one tablet by mouth daily, if no improvement in symptoms in 1 week please stop medication 08/19/15   [name], MD  Insulin Glargine (LANTUS SOLOSTAR) 100 UNIT/ML Solostar Pen Inject 20 Units into the skin daily at 10 pm.    Historical Provider, MD  levothyroxine (SYNTHROID, LEVOTHROID) 50 MCG tablet Take 50 mcg by mouth every morning. 07/27/15   Historical Provider, MD  lisinopril (PRINIVIL,ZESTRIL) 20 MG tablet Take 20 mg by mouth daily. 01/04/15   Historical Provider, MD  metFORMIN (GLUCOPHAGE) 500 MG tablet Take 500 mg by mouth 2 (two) times daily with a meal.    Historical Provider, MD  metoprolol tartrate (LOPRESSOR) 25 MG tablet TAKE 1 TABLET (25 MG TOTAL) BY MOUTH 2 (TWO) TIMES DAILY. 08/25/15   [name], PA-C  NITROSTAT 0.4 MG  SL tablet Place 0.4 mg under the tongue every 5 (five) minutes as needed for chest pain. Limit 3 tabs per day 09/04/14   Historical Provider, MD  pravastatin (PRAVACHOL) 80 MG tablet Take 80 mg by mouth daily.    Historical Provider, MD    Family History Family History  Problem Relation Age of Onset  . Stroke Father   . Healthy Mother   . Arthritis Mother   . Alzheimer's disease Mother   . Other      no family hx of coronary artery disease    Social History Social History  Substance Use Topics  . Smoking status: Former Smoker    Packs/day: 1.00    Years: 5.00    Types: Cigarettes    Quit date: 08/20/1984  . Smokeless tobacco: Former NeurosurgeonUser    Types: Chew     Comment: "stopped smoking cigarettes in the 1970's; quit chewing ~ 2000"  . Alcohol use No     Allergies   Review of patient's allergies indicates no known allergies.   Review of Systems Review of Systems  10 systems reviewed and all are negative for acute change except as noted in the HPI.   Physical Exam Updated Vital Signs BP 141/85 (BP Location: Right Arm)   Pulse 76   Temp 97.3 F (36.3 C) (Oral)   Resp (!) 40   Ht 5\' 10"  (1.778 m)    Wt 195 lb (88.5 kg)   SpO2 95%   BMI 27.98 kg/m   Physical Exam  Constitutional: He is oriented to person, place, and time. Vital signs are normal. He appears well-developed and well-nourished.  Non-toxic appearance. He does not appear ill. No distress.  HENT:  Head: Normocephalic and atraumatic.  Nose: Nose normal.  Mouth/Throat: Oropharynx is clear and moist. No oropharyngeal exudate.  Eyes: Conjunctivae and EOM are normal. Pupils are equal, round, and reactive to light. No scleral icterus.  Neck: Normal range of motion. Neck supple. JVD present. No tracheal deviation, no edema, no erythema and normal range of motion present. No thyroid mass and no thyromegaly present.  Cardiovascular: S1 normal, S2 normal, normal heart sounds, intact distal pulses and normal pulses.  An irregularly irregular rhythm present. Tachycardia present.  Exam reveals no gallop and no friction rub.   No murmur heard. Pulmonary/Chest: Tachypnea noted. He is in respiratory distress. He has wheezes (right lower lobe). He has no rhonchi. He has no rales.  NRB in place Use of accessory muscles  Abdominal: Soft. Normal appearance and bowel sounds are normal. He exhibits no distension, no ascites and no mass. There is no hepatosplenomegaly. There is no tenderness. There is no rebound, no guarding and no CVA tenderness.  Musculoskeletal: Normal range of motion. He exhibits edema (BLE). He exhibits no tenderness.  Lymphadenopathy:    He has no cervical adenopathy.  Neurological: He is alert and oriented to person, place, and time. He has normal strength. No cranial nerve deficit or sensory deficit.  Skin: Skin is warm, dry and intact. No petechiae and no rash noted. He is not diaphoretic. No erythema. No pallor.  Nursing note and vitals reviewed.    ED Treatments / Results  DIAGNOSTIC STUDIES:  Oxygen Saturation is 98% on NRB, normal by my interpretation.    COORDINATION OF CARE:  2:47 AM Discussed treatment plan  with pt at bedside and pt agreed to plan.  Labs (all labs ordered are listed, but only abnormal results are displayed) Labs Reviewed  CBC WITH  DIFFERENTIAL/PLATELET  BRAIN NATRIURETIC PEPTIDE  BASIC METABOLIC PANEL  I-STAT TROPOININ, ED  I-STAT CHEM 8, ED    EKG  EKG Interpretation None       Radiology No results found.  Procedures Procedures   Medications Ordered in ED Medications  nitroGLYCERIN (NITROSTAT) SL tablet 0.4 mg (not administered)     Initial Impression / Assessment and Plan / ED Course  I have reviewed the triage vital signs and the nursing notes.  Pertinent labs & imaging results that were available during my care of the patient were reviewed by me and considered in my medical decision making (see chart for details).  Clinical Course    Patient presents to the emergency department for shortness of breath. Bedside ultrasound shows B-lines, consistent with CHF exacerbation. He was given sublingual nitroglycerin initially. Potassium is normal and he was given IV Lasix. Chest x-ray shows possible pneumonia, nursing staff give ceftriaxone azithromycin for treatment. Patient remains on oxygen and is down to 80% on room air. He will require admission for further evaluation. I spoke with Dr. Maryfrances Bunnell who accepts the patient to telemetry.  Final Clinical Impressions(s) / ED Diagnoses   Final diagnoses:  None    New Prescriptions New Prescriptions   No medications on file     I personally performed the services described in this documentation, which was scribed in my presence. The recorded information has been reviewed and is accurate.      Tomasita Crumble, MD 09/19/15 [phone]

## 2015-09-20 DIAGNOSIS — I10 Essential (primary) hypertension: Secondary | ICD-10-CM

## 2015-09-20 DIAGNOSIS — I5033 Acute on chronic diastolic (congestive) heart failure: Secondary | ICD-10-CM

## 2015-09-20 DIAGNOSIS — I4891 Unspecified atrial fibrillation: Secondary | ICD-10-CM

## 2015-09-20 LAB — URINALYSIS, ROUTINE W REFLEX MICROSCOPIC
Bilirubin Urine: NEGATIVE
GLUCOSE, UA: NEGATIVE mg/dL
KETONES UR: NEGATIVE mg/dL
LEUKOCYTES UA: NEGATIVE
NITRITE: NEGATIVE
PH: 5 (ref 5.0–8.0)
PROTEIN: 100 mg/dL — AB
Specific Gravity, Urine: 1.023 (ref 1.005–1.030)

## 2015-09-20 LAB — CBC
HCT: 27.9 % — ABNORMAL LOW (ref 39.0–52.0)
Hemoglobin: 8.7 g/dL — ABNORMAL LOW (ref 13.0–17.0)
MCH: 25.1 pg — AB (ref 26.0–34.0)
MCHC: 31.2 g/dL (ref 30.0–36.0)
MCV: 80.4 fL (ref 78.0–100.0)
PLATELETS: 262 10*3/uL (ref 150–400)
RBC: 3.47 MIL/uL — AB (ref 4.22–5.81)
RDW: 15.2 % (ref 11.5–15.5)
WBC: 12.3 10*3/uL — AB (ref 4.0–10.5)

## 2015-09-20 LAB — GLUCOSE, CAPILLARY
GLUCOSE-CAPILLARY: 143 mg/dL — AB (ref 65–99)
GLUCOSE-CAPILLARY: 242 mg/dL — AB (ref 65–99)
GLUCOSE-CAPILLARY: 309 mg/dL — AB (ref 65–99)
Glucose-Capillary: 264 mg/dL — ABNORMAL HIGH (ref 65–99)

## 2015-09-20 LAB — BASIC METABOLIC PANEL
Anion gap: 9 (ref 5–15)
BUN: 33 mg/dL — ABNORMAL HIGH (ref 6–20)
CHLORIDE: 98 mmol/L — AB (ref 101–111)
CO2: 25 mmol/L (ref 22–32)
CREATININE: 1.26 mg/dL — AB (ref 0.61–1.24)
Calcium: 9.5 mg/dL (ref 8.9–10.3)
GFR calc non Af Amer: 50 mL/min — ABNORMAL LOW (ref >60)
GFR, EST AFRICAN AMERICAN: 58 mL/min — AB (ref >60)
Glucose, Bld: 167 mg/dL — ABNORMAL HIGH (ref 65–99)
POTASSIUM: 4 mmol/L (ref 3.5–5.1)
SODIUM: 132 mmol/L — AB (ref 135–145)

## 2015-09-20 LAB — BLOOD CULTURE ID PANEL (REFLEXED)
Acinetobacter baumannii: NOT DETECTED
CANDIDA KRUSEI: NOT DETECTED
CANDIDA PARAPSILOSIS: NOT DETECTED
CANDIDA TROPICALIS: NOT DETECTED
CARBAPENEM RESISTANCE: NOT DETECTED
Candida albicans: NOT DETECTED
Candida glabrata: NOT DETECTED
ENTEROBACTERIACEAE SPECIES: NOT DETECTED
ENTEROCOCCUS SPECIES: NOT DETECTED
Enterobacter cloacae complex: NOT DETECTED
Escherichia coli: NOT DETECTED
Haemophilus influenzae: NOT DETECTED
KLEBSIELLA OXYTOCA: NOT DETECTED
KLEBSIELLA PNEUMONIAE: NOT DETECTED
Listeria monocytogenes: NOT DETECTED
Methicillin resistance: NOT DETECTED
Neisseria meningitidis: NOT DETECTED
PROTEUS SPECIES: NOT DETECTED
Pseudomonas aeruginosa: NOT DETECTED
SERRATIA MARCESCENS: NOT DETECTED
STAPHYLOCOCCUS AUREUS BCID: NOT DETECTED
STAPHYLOCOCCUS SPECIES: DETECTED — AB
Streptococcus agalactiae: NOT DETECTED
Streptococcus pneumoniae: NOT DETECTED
Streptococcus pyogenes: NOT DETECTED
Streptococcus species: NOT DETECTED
VANCOMYCIN RESISTANCE: NOT DETECTED

## 2015-09-20 LAB — URINE MICROSCOPIC-ADD ON: Squamous Epithelial / LPF: NONE SEEN

## 2015-09-20 MED ORDER — KETOROLAC TROMETHAMINE 30 MG/ML IJ SOLN
15.0000 mg | Freq: Four times a day (QID) | INTRAMUSCULAR | Status: DC
  Administered 2015-09-20 – 2015-09-21 (×5): 15 mg via INTRAVENOUS
  Filled 2015-09-20 (×5): qty 1

## 2015-09-20 MED ORDER — LEVOFLOXACIN 750 MG PO TABS
750.0000 mg | ORAL_TABLET | ORAL | Status: DC
  Administered 2015-09-21: 750 mg via ORAL
  Filled 2015-09-20: qty 1

## 2015-09-20 NOTE — Progress Notes (Signed)
PHARMACY - PHYSICIAN COMMUNICATION CRITICAL VALUE ALERT - BLOOD CULTURE IDENTIFICATION (BCID)  Results for orders placed or performed during the hospital encounter of 09/19/15  Blood Culture ID Panel (Reflexed) (Collected: 09/19/2015  3:56 AM)  Result Value Ref Range   Enterococcus species NOT DETECTED NOT DETECTED   Vancomycin resistance NOT DETECTED NOT DETECTED   Listeria monocytogenes NOT DETECTED NOT DETECTED   Staphylococcus species DETECTED (A) NOT DETECTED   Staphylococcus aureus NOT DETECTED NOT DETECTED   Methicillin resistance NOT DETECTED NOT DETECTED   Streptococcus species NOT DETECTED NOT DETECTED   Streptococcus agalactiae NOT DETECTED NOT DETECTED   Streptococcus pneumoniae NOT DETECTED NOT DETECTED   Streptococcus pyogenes NOT DETECTED NOT DETECTED   Acinetobacter baumannii NOT DETECTED NOT DETECTED   Enterobacteriaceae species NOT DETECTED NOT DETECTED   Enterobacter cloacae complex NOT DETECTED NOT DETECTED   Escherichia coli NOT DETECTED NOT DETECTED   Klebsiella oxytoca NOT DETECTED NOT DETECTED   Klebsiella pneumoniae NOT DETECTED NOT DETECTED   Proteus species NOT DETECTED NOT DETECTED   Serratia marcescens NOT DETECTED NOT DETECTED   Carbapenem resistance NOT DETECTED NOT DETECTED   Haemophilus influenzae NOT DETECTED NOT DETECTED   Neisseria meningitidis NOT DETECTED NOT DETECTED   Pseudomonas aeruginosa NOT DETECTED NOT DETECTED   Candida albicans NOT DETECTED NOT DETECTED   Candida glabrata NOT DETECTED NOT DETECTED   Candida krusei NOT DETECTED NOT DETECTED   Candida parapsilosis NOT DETECTED NOT DETECTED   Candida tropicalis NOT DETECTED NOT DETECTED    Name of physician (or Provider) Contacted: Dr. Elisabeth Pigeonevine  Changes to prescribed antibiotics required: None  Mickeal SkinnerFrens, Gwynne Kemnitz John 09/20/2015  9:33 AM

## 2015-09-20 NOTE — Progress Notes (Addendum)
Physical Therapy Treatment Patient Details Name: Esmond PlantsClyde D Passey MRN: [id number] DOB: [birth year] Today's Date: 09/20/2015    History of Present Illness Patient is an 82 yo male admitted 09/19/15 with acute respiratory failure with hypoxia.   PMH:  COPD, CHF, recent UTI, Afib, DM    PT Comments    Patient apprehensive initially, however ultimately glad he "got moving." He reports nothing has made his chest pain better and coughing makes it worse. Tolerated ambulation on room air with SaO2 92%.   Follow Up Recommendations  Home health PT;Supervision for mobility/OOB     Equipment Recommendations  Wheelchair (measurements PT);Wheelchair cushion (measurements PT)    Recommendations for Other Services       Precautions / Restrictions Precautions Precautions: Fall Restrictions Weight Bearing Restrictions: No    Mobility  Bed Mobility Overal bed mobility: Needs Assistance Bed Mobility: Supine to Sit;Sit to Supine     Supine to sit: HOB elevated;Supervision     General bed mobility comments: no cues; supervision for safety due to reported chest pain (appears musculoskeletal)  Transfers Overall transfer level: Needs assistance Equipment used: Rolling walker (2 wheeled) Transfers: Sit to/from Stand Sit to Stand: Min guard         General transfer comment: Verbal cues for hand placement x1; x5 reps total  Ambulation/Gait Ambulation/Gait assistance: Min guard Ambulation Distance (Feet): 32 Feet Assistive device: Rolling walker (2 wheeled) Gait Pattern/deviations: Step-through pattern;Decreased stride length;Trunk flexed Gait velocity: decreased   General Gait Details: Verbal cues to stand upright during gait.  Patient with slow, guarded steps with flexed posture.  Attempted gait on room air with O2 sats 93%.     Stairs            Wheelchair Mobility    Modified Rankin (Stroke Patients Only)       Balance Overall balance assessment: Needs  assistance Sitting-balance support: No upper extremity supported;Feet supported Sitting balance-Leahy Scale: Fair     Standing balance support: Bilateral upper extremity supported Standing balance-Leahy Scale: Poor Standing balance comment: steady with RW                    Cognition Arousal/Alertness: Awake/alert Behavior During Therapy: WFL for tasks assessed/performed Overall Cognitive Status: Within Functional Limits for tasks assessed       Memory: Decreased short-term memory              Exercises Other Exercises Other Exercises: sit to stand x 5; including without UE support ascend/descend    General Comments        Pertinent Vitals/Pain Pain Assessment: Faces Faces Pain Scale: Hurts little more Pain Location: lower sternum; hurts with coughing Pain Descriptors / Indicators: Discomfort;Guarding Pain Intervention(s): Limited activity within patient's tolerance;Monitored during session;Repositioned    Home Living                      Prior Function            PT Goals (current goals can now be found in the care plan section) Acute Rehab PT Goals Patient Stated Goal: To go home soon Time For Goal Achievement: 09/26/15 Progress towards PT goals: Progressing toward goals    Frequency  Min 3X/week    PT Plan Current plan remains appropriate    Co-evaluation             End of Session Equipment Utilized During Treatment: Gait belt Activity Tolerance: Patient tolerated treatment well Patient left: with call bell/phone  within reach;in chair;with nursing/sitter in room     Time: [phone] PT Time Calculation (min) (ACUTE ONLY): 23 min  Charges:  $Gait Training: 8-22 mins $Therapeutic Exercise: 8-22 mins                    G Codes:      Ezrah Panning 2015-09-29, 5:32 PM Pager [phone]

## 2015-09-20 NOTE — Progress Notes (Signed)
PT Cancellation Note  Patient Details Name: Frank Marks MRN: [id number] DOB: December 25, 1926   Cancelled Treatment:    Reason Eval/Treat Not Completed: Medical issues which prohibited therapy   Patient states he can't do therapy due to increased chest pain. States he has told his nurse. RN reports he has chronic chest pain, has been given nitroglycerin twice, and recently given toradol.   Will attempt to return later today to encourage incr activity.   Shevette Bess 09/20/2015, 11:17 AM  Pager [phone]

## 2015-09-20 NOTE — Progress Notes (Addendum)
Patient ID: Frank Marks, male   DOB: 1926/05/20, 82 y.o.   MRN: [id number]  PROGRESS NOTE    Frank Marks  WUJ:[id number] DOB: 1926-04-03 DOA: 09/19/2015  PCP: Ailene Ravel, MD   Brief Narrative:  82 y.o.malewith a past medical history significant for COPD, chronic diastolic CHF, recent UTI, Afib on Pradaxa, IDDMwho [facility] with respiratory difficulty and cough productive of yellowish sputum. His symptoms have started about a month PTA but have progressively worsened especially in past 2-3 days PTA. In ED, pt was afebrile, HR 70-100, RR up to 40, O2 sat 86% on room air which has improved to 90's with [place] oxygen support. CXR showed bilateral patchy opacities consistent with CHF and pneumonia. He was given lasix 80 mg IV and azithro/rocephin which was subsequently changed to Levaquin.    Assessment & Plan:  Acute respiratory failure with hypoxia / Acute COPD exacerbation - Use duoneb every 4 hours as needed for shortness of breath or wheezing - Continue oxygen support via [place] to keep O2 sat above 90% - Continue daily prednisone  Lobar pneumonia, unspecified organism / Leukocytosis - Continue Levaquin daily - Blood cultures with gram pos cocci in clusters, likely coag species and contaminant - We will follow up on final culture results  Chronic kidney disease, stage 3 - Baseline Cr 1.22 about 3 months ago - Cr 1.33 on this admission - Continue to monitor renal function while pt on IV lasix  Anemia of chronic disease - Due to CKD - Hemoglobin is 8.7 today   Diabetes mellitus with diabetic nephropathy without long term insulin use - On metformin at home and on SSI in hospital   Atrial fibrillation, chronic - CHADS vasc score 4 - On anticoagulation with pradaxa - Rate controlled with Metoprolol and Cardizem   Acute and chronic diastolic CHF - BNP on admission 390 on this admission - 2 D ECHO in 2016 with preserved EF - Pt on IV lasix 40 mg daily - Weight  since 09/19/2015 --> 79.7 kg --> 82.8 kg --> 80.9 kg  Essential hypertension - Continue metoprolol and lisinopril  Dyslipidemia associated with type 2 DM - Continue pravastatin    DVT prophylaxis: aspirin, dabigatran Code Status: full code  Family Communication: no family at the bedside   Disposition Plan: pT eval in progress, home likely in next 48 hours    Consultants:   PT  Procedures:   None   Antimicrobials:   Levaquin 09/19/2015 -->   Subjective: No overnight events.  Objective: Vitals:   09/19/15 2317 09/20/15 0609 09/20/15 0740 09/20/15 1128  BP: 116/80 120/73 134/74 113/75  Pulse: 71 72 73 79  Resp: 18 16 16 18   Temp: 97.7 F (36.5 C) 97.7 F (36.5 C) 97.7 F (36.5 C) 97.5 F (36.4 C)  TempSrc: Oral Oral Oral Oral  SpO2: 100% 97% 94% 99%  Weight:  80.9 kg (178 lb 6.4 oz)    Height:        Intake/Output Summary (Last 24 hours) at 09/20/15 1315 Last data filed at 09/20/15 1127  Gross per 24 hour  Intake             1225 ml  Output              975 ml  Net              250 ml   Filed Weights   09/19/15 0417 09/19/15 0600 09/20/15 0609  Weight: 79.7 kg (175  lb 12.8 oz) 82.8 kg (182 lb 8.7 oz) 80.9 kg (178 lb 6.4 oz)    Examination:  General exam: Appears calm and comfortable  Respiratory system: Clear to auscultation. Respiratory effort normal. Cardiovascular system: S1 & S2 heard, RRR. No JVD Gastrointestinal system: Abdomen is nondistended, soft and nontender. No organomegaly or masses felt. Normal bowel sounds heard. Central nervous system: Alert and oriented. No focal neurological deficits. Extremities: Symmetric 5 x 5 power. Skin: No rashes, lesions or ulcers Psychiatry: Judgement and insight appear normal. Mood & affect appropriate.   Data Reviewed: I have personally reviewed following labs and imaging studies  CBC:  Recent Labs Lab 09/19/15 0314 09/19/15 0319 09/20/15 0412  WBC 16.1*  --  12.3*  NEUTROABS 14.3*  --   --   HGB  9.0* 9.5* 8.7*  HCT 28.0* 28.0* 27.9*  MCV 79.5  --  80.4  PLT 247  --  262   Basic Metabolic Panel:  Recent Labs Lab 09/19/15 0314 09/19/15 0319 09/19/15 1206 09/20/15 0412  NA 130* 132* 126* 132*  K 4.1 4.1 3.7 4.0  CL 98* 97* 93* 98*  CO2 23  --  23 25  GLUCOSE 212* 213* 193* 167*  BUN 31* 30* 31* 33*  CREATININE 1.33* 1.20 1.38* 1.26*  CALCIUM 9.4  --  8.8* 9.5   GFR: Estimated Creatinine Clearance: 43.5 mL/min (by C-G formula based on SCr of 1.26 mg/dL). Liver Function Tests: No results for input(s): AST, ALT, ALKPHOS, BILITOT, PROT, ALBUMIN in the last 168 hours. No results for input(s): LIPASE, AMYLASE in the last 168 hours. No results for input(s): AMMONIA in the last 168 hours. Coagulation Profile: No results for input(s): INR, PROTIME in the last 168 hours. Cardiac Enzymes: No results for input(s): CKTOTAL, CKMB, CKMBINDEX, TROPONINI in the last 168 hours. BNP (last 3 results) No results for input(s): PROBNP in the last 8760 hours. HbA1C: No results for input(s): HGBA1C in the last 72 hours. CBG:  Recent Labs Lab 09/19/15 1315 09/19/15 1617 09/19/15 2057 09/20/15 0651 09/20/15 1130  GLUCAP 267* 242* 179* 143* 242*   Lipid Profile: No results for input(s): CHOL, HDL, LDLCALC, TRIG, CHOLHDL, LDLDIRECT in the last 72 hours. Thyroid Function Tests:  Recent Labs  09/19/15 1206  TSH 1.485   Anemia Panel: No results for input(s): VITAMINB12, FOLATE, FERRITIN, TIBC, IRON, RETICCTPCT in the last 72 hours. Urine analysis:    Component Value Date/Time   COLORURINE YELLOW 09/20/2015 0628   APPEARANCEUR CLEAR 09/20/2015 0628   LABSPEC 1.023 09/20/2015 0628   PHURINE 5.0 09/20/2015 0628   GLUCOSEU NEGATIVE 09/20/2015 0628   HGBUR TRACE (A) 09/20/2015 0628   BILIRUBINUR NEGATIVE 09/20/2015 0628   KETONESUR NEGATIVE 09/20/2015 0628   PROTEINUR 100 (A) 09/20/2015 0628   UROBILINOGEN 1.0 11/07/2014 1417   NITRITE NEGATIVE 09/20/2015 0628   LEUKOCYTESUR  NEGATIVE 09/20/2015 0628   Sepsis Labs: @LABRCNTIP (procalcitonin:4,lacticidven:4)   Recent Results (from the past 240 hour(s))  Culture, blood (Routine X 2) w Reflex to ID Panel     Status: None (Preliminary result)   Collection Time: 09/19/15  3:56 AM  Result Value Ref Range Status   Specimen Description BLOOD RIGHT ARM  Final   Special Requests BOTTLES DRAWN AEROBIC AND ANAEROBIC 7CC   Final   Culture  Setup Time   Final    ANAEROBIC BOTTLE ONLY GRAM POSITIVE COCCI IN CLUSTERS CRITICAL RESULT CALLED TO, READ BACK BY AND VERIFIED WITH: CRITICAL RESULT CALLED TO, READ BACK BY AND VERIFIED  WITH: Lorenza Evangelist AT 1610 09/20/15 BY L BENFIELD    Culture GRAM POSITIVE COCCI  Final   Report Status PENDING  Incomplete  Blood Culture ID Panel (Reflexed)     Status: Abnormal   Collection Time: 09/19/15  3:56 AM  Result Value Ref Range Status   Enterococcus species NOT DETECTED NOT DETECTED Final   Vancomycin resistance NOT DETECTED NOT DETECTED Final   Listeria monocytogenes NOT DETECTED NOT DETECTED Final   Staphylococcus species DETECTED (A) NOT DETECTED Final   Staphylococcus aureus NOT DETECTED NOT DETECTED Final   Methicillin resistance NOT DETECTED NOT DETECTED Final   Streptococcus species NOT DETECTED NOT DETECTED Final   Streptococcus agalactiae NOT DETECTED NOT DETECTED Final   Streptococcus pneumoniae NOT DETECTED NOT DETECTED Final   Streptococcus pyogenes NOT DETECTED NOT DETECTED Final   Acinetobacter baumannii NOT DETECTED NOT DETECTED Final   Enterobacteriaceae species NOT DETECTED NOT DETECTED Final   Enterobacter cloacae complex NOT DETECTED NOT DETECTED Final   Escherichia coli NOT DETECTED NOT DETECTED Final   Klebsiella oxytoca NOT DETECTED NOT DETECTED Final   Klebsiella pneumoniae NOT DETECTED NOT DETECTED Final   Proteus species NOT DETECTED NOT DETECTED Final   Serratia marcescens NOT DETECTED NOT DETECTED Final   Carbapenem resistance NOT DETECTED NOT DETECTED  Final   Haemophilus influenzae NOT DETECTED NOT DETECTED Final   Neisseria meningitidis NOT DETECTED NOT DETECTED Final   Pseudomonas aeruginosa NOT DETECTED NOT DETECTED Final   Candida albicans NOT DETECTED NOT DETECTED Final   Candida glabrata NOT DETECTED NOT DETECTED Final   Candida krusei NOT DETECTED NOT DETECTED Final   Candida parapsilosis NOT DETECTED NOT DETECTED Final   Candida tropicalis NOT DETECTED NOT DETECTED Final  Culture, blood (Routine X 2) w Reflex to ID Panel     Status: None (Preliminary result)   Collection Time: 09/19/15  4:08 AM  Result Value Ref Range Status   Specimen Description BLOOD RIGHT HAND  Final   Special Requests IN PEDIATRIC BOTTLE 4CC  Final   Culture NO GROWTH < 24 HOURS  Final   Report Status PENDING  Incomplete  MRSA PCR Screening     Status: Abnormal   Collection Time: 09/19/15  6:57 AM  Result Value Ref Range Status   MRSA by PCR POSITIVE (A) NEGATIVE Final      Radiology Studies: Dg Chest Port 1 View Result Date: 09/19/2015 CLINICAL DATA:   Interval development of congestive changes with mild interstitial edema and small left pleural effusion. Pneumonia is not excluded. Clinical correlation and follow-up recommended. Electronically Signed   By: Elgie Collard M.D.   On: 09/19/2015 03:29      Scheduled Meds: . aspirin EC  81 mg Oral Daily  . Chlorhexidine Gluconate Cloth  6 each Topical Q0600  . dabigatran  150 mg Oral BID  . diltiazem  240 mg Oral Daily  . docusate sodium  100 mg Oral Q12H  . ferrous sulfate  325 mg Oral Q breakfast  . furosemide  40 mg Intravenous Daily  . insulin aspart  0-15 Units Subcutaneous TID WC  . insulin aspart  0-5 Units Subcutaneous QHS  . insulin glargine  15 Units Subcutaneous Q2200  . ketorolac  15 mg Intravenous Q6H  . [START ON 09/21/2015] levofloxacin  750 mg Oral Q48H  . levothyroxine  50 mcg Oral QAC breakfast  . lisinopril  20 mg Oral Daily  . metoprolol tartrate  25 mg Oral BID  .  mupirocin ointment  1 application Nasal BID  . pravastatin  80 mg Oral Daily  . predniSONE  40 mg Oral Q breakfast   Continuous Infusions:    LOS: 1 day    Time spent: 25 minutes  Greater than 50% of the time spent on counseling and coordinating the care.   Manson PasseyEVINE, Dolora Ridgely, MD [facility] Pager [phone]  If 7PM-7AM, please contact night-coverage [email or web address] Password [email or web address] 09/20/2015, 1:15 PM

## 2015-09-21 DIAGNOSIS — Z7901 Long term (current) use of anticoagulants: Secondary | ICD-10-CM

## 2015-09-21 DIAGNOSIS — I482 Chronic atrial fibrillation: Secondary | ICD-10-CM

## 2015-09-21 LAB — GLUCOSE, CAPILLARY
GLUCOSE-CAPILLARY: 235 mg/dL — AB (ref 65–99)
Glucose-Capillary: 161 mg/dL — ABNORMAL HIGH (ref 65–99)

## 2015-09-21 LAB — CBC
HEMATOCRIT: 26.7 % — AB (ref 39.0–52.0)
Hemoglobin: 8.7 g/dL — ABNORMAL LOW (ref 13.0–17.0)
MCH: 26 pg (ref 26.0–34.0)
MCHC: 32.6 g/dL (ref 30.0–36.0)
MCV: 79.7 fL (ref 78.0–100.0)
PLATELETS: 263 10*3/uL (ref 150–400)
RBC: 3.35 MIL/uL — AB (ref 4.22–5.81)
RDW: 15.1 % (ref 11.5–15.5)
WBC: 11.2 10*3/uL — AB (ref 4.0–10.5)

## 2015-09-21 LAB — PROCALCITONIN: PROCALCITONIN: 0.21 ng/mL

## 2015-09-21 LAB — BASIC METABOLIC PANEL
Anion gap: 8 (ref 5–15)
BUN: 47 mg/dL — AB (ref 6–20)
CHLORIDE: 98 mmol/L — AB (ref 101–111)
CO2: 24 mmol/L (ref 22–32)
CREATININE: 1.26 mg/dL — AB (ref 0.61–1.24)
Calcium: 9.4 mg/dL (ref 8.9–10.3)
GFR, EST AFRICAN AMERICAN: 58 mL/min — AB (ref >60)
GFR, EST NON AFRICAN AMERICAN: 50 mL/min — AB (ref >60)
Glucose, Bld: 217 mg/dL — ABNORMAL HIGH (ref 65–99)
POTASSIUM: 4 mmol/L (ref 3.5–5.1)
SODIUM: 130 mmol/L — AB (ref 135–145)

## 2015-09-21 LAB — URINE CULTURE: Culture: NO GROWTH

## 2015-09-21 MED ORDER — LEVOFLOXACIN 750 MG PO TABS: 750.0000 mg | ORAL_TABLET | ORAL | 0 refills | Status: DC

## 2015-09-21 MED ORDER — ACETAMINOPHEN 325 MG PO TABS: 650.0000 mg | ORAL_TABLET | Freq: Four times a day (QID) | ORAL | 0 refills | Status: AC | PRN

## 2015-09-21 NOTE — Discharge Summary (Addendum)
Physician Discharge Summary  Frank Marks:[id number] DOB: [birth year] DOA: 09/19/2015  PCP: Ailene Ravel, MD  Admit date: 09/19/2015 Discharge date: 09/21/2015  Recommendations for Outpatient Follow-up:  Home health orders placed prior to discharge Continue Levaquin every other day for 4 more doses on discharge.  Discharge Diagnoses:  Principal Problem:   Acute respiratory failure with hypoxia (HCC) Active Problems:   Permanent atrial fibrillation (HCC)   Essential hypertension   UTI (urinary tract infection)   Diabetes mellitus type 2 in nonobese (HCC)   Acute on chronic diastolic CHF (congestive heart failure), NYHA class 3 (HCC)   Pulmonary hypertension-PA 50 mmHg   Chronic anticoagulation-Pradaxa   COPD, moderate (HCC)   CHF exacerbation (HCC)    Discharge Condition: stable   Diet recommendation: as tolerated   History of present illness:  82 y.o.malewith a past medical history significant for COPD, chronic diastolic CHF, recent UTI, Afib on Pradaxa, ID[facility]who [facility] productive of yellowish sputum. His symptoms have started about a month PTA but have progressively worsened especially in past 2-3 days PTA.  In ED, pt was afebrile, HR 70-100, RR up to 40, O2 sat 86% on room air which has improved to 90's with [place] oxygen support. CXR showed bilateral patchy opacities consistent with CHF and pneumonia. He was given lasix 80 mg IV and azithro/rocephin which was subsequently changed to Levaquin.    Hospital Course:   Assessment & Plan:  Acute respiratory failure with hypoxia / Acute COPD exacerbation - Stable respiratory status - No need for steroids on discharge, no wheezing and no rhonchi - No need for bronchodilators on discharge   Lobar pneumonia, unspecified organism / Leukocytosis - Continue Levaquin every other day for 4 more days on discharge  - Blood cultures with gram pos cocci in clusters, likely coag  species and contaminant  Chronic kidney disease, stage 3 - Baseline Cr 1.22 about 3 months ago - Cr 1.33 on this admission and then 1.26 this am, at baseline values  Anemia of chronic disease - Due to CKD - Hemoglobin is 8.7, overall stable    Diabetes mellitus with diabetic nephropathy without long term insulin use - On metformin at home and on SSI in hospital  - May continue metformin on discharge   Atrial fibrillation, chronic - CHADS vasc score 4 - On anticoagulation with pradaxa - Rate controlled with Metoprolol and Cardizem   Acute and chronic diastolic CHF - BNP on admission 390 on this admission - 2 D ECHO in 2016 with preserved EF - Pt was on IV lasix, his resp status much better this am, spoke with pt son over the phone to have his father follow up with PCP and cardiology who can determine if lasix is needed in long run - Weight since 09/19/2015 82.8 kg --> 80.9 kg --> 82.9 kg (not sure how accurate the weight measurements were)  Essential hypertension - Continue metoprolol and lisinopril  Dyslipidemia associated with type 2 DM - Continue pravastatin    DVT prophylaxis: aspirin, dabigatran Code Status: full code  Family Communication: no family at the bedside; spoke with his son over the phone [phone]  And discussed D/C plan, agrees with discharge with HH orders, abx on discharge and follow up with cardio for evaluation if pt needs lasix in a long run    Consultants:   PT  Procedures:   None   Antimicrobials:   Levaquin 09/19/2015 --> for 4 more doses on  discharge    Signed:  Manson Passey, MD  [facility] 09/21/2015, 11:59 AM  Pager #: [phone]  Time spent in minutes: less than 30 minutes    Discharge Exam: Vitals:   09/21/15 0027 09/21/15 0458  BP: (!) 149/86 122/70  Pulse: 79 72  Resp: 20 20  Temp: 97.7 F (36.5 C) 97.7 F (36.5 C)   Vitals:   09/20/15 1636 09/20/15 1955 09/21/15 0027 09/21/15 0458  BP:  125/75 (!) 130/93 (!) 149/86 122/70  Pulse: 80 80 79 72  Resp: 18 20 20 20   Temp:  (!) 96.8 F (36 C) 97.7 F (36.5 C) 97.7 F (36.5 C)  TempSrc:  Oral Oral Oral  SpO2: 95% 100% 100% 94%  Weight:    82.9 kg (182 lb 12.2 oz)  Height:        General: Pt is alert, follows commands appropriately, not in acute distress Cardiovascular: Regular rate and rhythm, S1/S2 appreciated  Respiratory: Clear to auscultation bilaterally, no wheezing, no crackles, no rhonchi Abdominal: Non tender, non distended, bowel sounds +, no guarding Extremities: no edema, no cyanosis, pulses palpable bilaterally DP and PT Neuro: Grossly nonfocal  Discharge Instructions  Discharge Instructions    Diet - low sodium heart healthy    Complete by:  As directed   Discharge instructions    Complete by:  As directed   Home health orders placed prior to discharge Continue Levaquin every other day for 4 more doses on discharge.   Increase activity slowly    Complete by:  As directed       Medication List    TAKE these medications   acetaminophen 325 MG tablet Commonly known as:  TYLENOL Take 2 tablets (650 mg total) by mouth every 6 (six) hours as needed for mild pain (or Fever >/= 101).   aspirin 81 MG tablet Take 81 mg by mouth every morning.   dabigatran 150 MG Caps capsule Commonly known as:  PRADAXA Take 1 capsule (150 mg total) by mouth 2 (two) times daily.   diltiazem 240 MG 24 hr capsule Commonly known as:  CARDIZEM CD TAKE 1 CAPSULE (240 MG TOTAL) BY MOUTH DAILY. What changed:  See the new instructions.   docusate sodium 100 MG capsule Commonly known as:  COLACE Take 1 capsule (100 mg total) by mouth every 12 (twelve) hours.   ferrous sulfate 325 (65 FE) MG tablet Take 1 tablet (325 mg total) by mouth daily with breakfast. For one month What changed:  when to take this  additional instructions   folic acid 1 MG tablet Commonly known as:  FOLVITE Take 1 mg by mouth every evening.    glimepiride 2 MG tablet Commonly known as:  AMARYL Take 2 mg by mouth daily with breakfast.   LANTUS SOLOSTAR 100 UNIT/ML Solostar Pen Generic drug:  Insulin Glargine Inject 15 Units into the skin daily at 10 pm.   levofloxacin 750 MG tablet Commonly known as:  LEVAQUIN Take 1 tablet (750 mg total) by mouth every other day.   levothyroxine 50 MCG tablet Commonly known as:  SYNTHROID, LEVOTHROID Take 50 mcg by mouth every evening.   lisinopril 20 MG tablet Commonly known as:  PRINIVIL,ZESTRIL Take 20 mg by mouth every morning.   metFORMIN 500 MG tablet Commonly known as:  GLUCOPHAGE Take 500 mg by mouth 2 (two) times daily with a meal.   metoprolol tartrate 25 MG tablet Commonly known as:  LOPRESSOR TAKE 1 TABLET (25 MG TOTAL) BY  MOUTH 2 (TWO) TIMES DAILY.   NITROSTAT 0.4 MG SL tablet Generic drug:  nitroGLYCERIN Place 0.4 mg under the tongue every 5 (five) minutes as needed for chest pain. Limit 3 tabs per day   pravastatin 80 MG tablet Commonly known as:  PRAVACHOL Take 80 mg by mouth every evening.      Follow-up Information    HAMRICK,MAURA L, MD. Schedule an appointment as soon as possible for a visit in 1 week(s).   Specialty:  Family Medicine Contact information: [address] [place] [address] [phone]            The results of significant diagnostics from this hospitalization (including imaging, microbiology, ancillary and laboratory) are listed below for reference.    Significant Diagnostic Studies: Dg Chest Port 1 View Result Date: 09/19/2015 CLINICAL DATA:  Interval development of congestive changes with mild interstitial edema and small left pleural effusion. Pneumonia is not excluded. Clinical correlation and follow-up recommended.    Microbiology: Recent Results (from the past 240 hour(s))  Culture, blood (Routine X 2) w Reflex to ID Panel     Status: None (Preliminary result)   Collection Time: 09/19/15  3:56 AM  Result  Value Ref Range Status   Specimen Description BLOOD RIGHT ARM  Final   Special Requests BOTTLES DRAWN AEROBIC AND ANAEROBIC 7CC   Final   Culture  Setup Time   Final   Culture GRAM POSITIVE COCCI  Final   Report Status PENDING  Incomplete  Blood Culture ID Panel (Reflexed)     Status: Abnormal   Collection Time: 09/19/15  3:56 AM  Result Value Ref Range Status   Enterococcus species NOT DETECTED NOT DETECTED Final   Vancomycin resistance NOT DETECTED NOT DETECTED Final   Listeria monocytogenes NOT DETECTED NOT DETECTED Final   Staphylococcus species DETECTED (A) NOT DETECTED Final    Comment: CRITICAL RESULT CALLED TO, READ BACK BY AND VERIFIED WITH: J FRENS,PHARMD AT 0826 09/20/15 BY L BENFIELD    Staphylococcus aureus NOT DETECTED NOT DETECTED Final   Methicillin resistance NOT DETECTED NOT DETECTED Final   Streptococcus species NOT DETECTED NOT DETECTED Final   Streptococcus agalactiae NOT DETECTED NOT DETECTED Final   Streptococcus pneumoniae NOT DETECTED NOT DETECTED Final   Streptococcus pyogenes NOT DETECTED NOT DETECTED Final   Acinetobacter baumannii NOT DETECTED NOT DETECTED Final   Enterobacteriaceae species NOT DETECTED NOT DETECTED Final   Enterobacter cloacae complex NOT DETECTED NOT DETECTED Final   Escherichia coli NOT DETECTED NOT DETECTED Final   Klebsiella oxytoca NOT DETECTED NOT DETECTED Final   Klebsiella pneumoniae NOT DETECTED NOT DETECTED Final   Proteus species NOT DETECTED NOT DETECTED Final   Serratia marcescens NOT DETECTED NOT DETECTED Final   Carbapenem resistance NOT DETECTED NOT DETECTED Final   Haemophilus influenzae NOT DETECTED NOT DETECTED Final   Neisseria meningitidis NOT DETECTED NOT DETECTED Final   Pseudomonas aeruginosa NOT DETECTED NOT DETECTED Final   Candida albicans NOT DETECTED NOT DETECTED Final   Candida glabrata NOT DETECTED NOT DETECTED Final   Candida krusei NOT DETECTED NOT DETECTED Final   Candida parapsilosis NOT DETECTED NOT  DETECTED Final   Candida tropicalis NOT DETECTED NOT DETECTED Final  Culture, blood (Routine X 2) w Reflex to ID Panel     Status: None (Preliminary result)   Collection Time: 09/19/15  4:08 AM  Result Value Ref Range Status   Specimen Description BLOOD RIGHT HAND  Final   Special Requests IN PEDIATRIC BOTTLE  4CC  Final   Culture NO GROWTH 2 DAYS  Final   Report Status PENDING  Incomplete  MRSA PCR Screening     Status: Abnormal   Collection Time: 09/19/15  6:57 AM  Result Value Ref Range Status   MRSA by PCR POSITIVE (A) NEGATIVE Final  Urine culture     Status: None   Collection Time: 09/20/15  6:28 AM  Result Value Ref Range Status   Specimen Description URINE, RANDOM  Final   Special Requests NONE  Final   Culture NO GROWTH  Final   Report Status 09/21/2015 FINAL  Final     Labs: Basic Metabolic Panel:  Recent Labs Lab 09/19/15 0314 09/19/15 0319 09/19/15 1206 09/20/15 0412 09/21/15 0425  NA 130* 132* 126* 132* 130*  K 4.1 4.1 3.7 4.0 4.0  CL 98* 97* 93* 98* 98*  CO2 23  --  23 25 24   GLUCOSE 212* 213* 193* 167* 217*  BUN 31* 30* 31* 33* 47*  CREATININE 1.33* 1.20 1.38* 1.26* 1.26*  CALCIUM 9.4  --  8.8* 9.5 9.4   Liver Function Tests: No results for input(s): AST, ALT, ALKPHOS, BILITOT, PROT, ALBUMIN in the last 168 hours. No results for input(s): LIPASE, AMYLASE in the last 168 hours. No results for input(s): AMMONIA in the last 168 hours. CBC:  Recent Labs Lab 09/19/15 0314 09/19/15 0319 09/20/15 0412 09/21/15 0425  WBC 16.1*  --  12.3* 11.2*  NEUTROABS 14.3*  --   --   --   HGB 9.0* 9.5* 8.7* 8.7*  HCT 28.0* 28.0* 27.9* 26.7*  MCV 79.5  --  80.4 79.7  PLT 247  --  262 263   Cardiac Enzymes: No results for input(s): CKTOTAL, CKMB, CKMBINDEX, TROPONINI in the last 168 hours. BNP: BNP (last 3 results)  Recent Labs  06/22/15 1400 09/19/15 0314  BNP 168.2* 390.8*    ProBNP (last 3 results) No results for input(s): PROBNP in the last 8760  hours.  CBG:  Recent Labs Lab 09/20/15 0651 09/20/15 1130 09/20/15 1635 09/20/15 2126 09/21/15 0703  GLUCAP 143* 242* 264* 309* 161*

## 2015-09-21 NOTE — Progress Notes (Signed)
Inpatient Diabetes Program Recommendations  AACE/ADA: New Consensus Statement on Inpatient Glycemic Control (2015)  Target Ranges:  Prepandial:   less than 140 mg/dL      Peak postprandial:   less than 180 mg/dL (1-2 hours)      Critically ill patients:  140 - 180 mg/dL   Results for [name], Frank Marks (MRN [id number]) as of 09/21/2015 07:54  Ref. Range 09/20/2015 06:51 09/20/2015 11:30 09/20/2015 16:35 09/20/2015 21:26 09/21/2015 07:03  Glucose-Capillary Latest Ref Range: 65 - 99 mg/dL 409143 (H) 811242 (H) 914264 (H) 309 (H) 161 (H)   Review of Glycemic Control  Diabetes history: DM2 Outpatient Diabetes medications: Lantus 15 units QHS, Amaryl 2 mg QAM, Metformin 500 mg BID Current orders for Inpatient glycemic control: Lantus 15 units Q10pm, Novolog 0-15 units TID with meals, Novolog 0-5 units QHS  Inpatient Diabetes Program Recommendations:  Insulin - Meal Coverage: Post prandial glucose is consistently elevated.  If steroids are continued, please consider ordering Novolog 5 units TID with meals for meal coverage if patient eats at least 50% of meal.  Thanks, Orlando PennerMarie Ski Polich, RN, MSN, CDE Diabetes Coordinator Inpatient Diabetes Program [phone] (Team Pager from 8am to 5pm) [phone] ([facility] office) [phone] [facility] office) [phone] [facility] office)

## 2015-09-21 NOTE — Care Management Note (Signed)
Case Management Note  Patient Details  Name: Frank PlantsClyde D Sterne MRN: [id number] Date of Birth: May 27, 1926                Action/Plan: Talked to patient's son at the bedside about HHC choices, son requested [facility]; Lup[facility] Le[facility]Donna [facility]. Wheelchair ordered as requested and to be delivered to the room today prior to discharging home.  Expected Discharge Date:  09/21/15               Expected Discharge Plan:  Home w Home Health Services  [facility]   [facility]  Discharge planning Services  CM Consult   Choice offered to:  Adult Children  DME Arranged:  Government social research officerWheelchair manual DME Agency:  [facility]  HH Arranged:  RN, PT, OT, Nurse's Aide HH Agency:  [facility]  Status of Service:  In process, will continue to follow  Reola MosherChandler, Hermena Swint L, RN,MHA,BSN [phone] 09/21/2015, 1:25 PM

## 2015-09-21 NOTE — Discharge Instructions (Signed)

## 2015-09-22 ENCOUNTER — Emergency Department (HOSPITAL_COMMUNITY)
Admission: EM | Admit: 2015-09-22 | Discharge: 2015-09-22 | Disposition: A | Discharge status: Home / Self Care | Payer: Medicare Other | Attending: Emergency Medicine | Admitting: Emergency Medicine

## 2015-09-22 ENCOUNTER — Encounter (HOSPITAL_COMMUNITY): Payer: Self-pay

## 2015-09-22 ENCOUNTER — Emergency Department (HOSPITAL_COMMUNITY): Payer: Medicare Other

## 2015-09-22 DIAGNOSIS — Z794 Long term (current) use of insulin: Secondary | ICD-10-CM | POA: Insufficient documentation

## 2015-09-22 DIAGNOSIS — Z7901 Long term (current) use of anticoagulants: Secondary | ICD-10-CM | POA: Diagnosis not present

## 2015-09-22 DIAGNOSIS — E1165 Type 2 diabetes mellitus with hyperglycemia: Secondary | ICD-10-CM | POA: Diagnosis present

## 2015-09-22 DIAGNOSIS — Z87891 Personal history of nicotine dependence: Secondary | ICD-10-CM | POA: Insufficient documentation

## 2015-09-22 DIAGNOSIS — Z8673 Personal history of transient ischemic attack (TIA), and cerebral infarction without residual deficits: Secondary | ICD-10-CM | POA: Insufficient documentation

## 2015-09-22 DIAGNOSIS — R739 Hyperglycemia, unspecified: Secondary | ICD-10-CM

## 2015-09-22 DIAGNOSIS — I110 Hypertensive heart disease with heart failure: Secondary | ICD-10-CM | POA: Diagnosis not present

## 2015-09-22 DIAGNOSIS — Z7982 Long term (current) use of aspirin: Secondary | ICD-10-CM | POA: Insufficient documentation

## 2015-09-22 DIAGNOSIS — I5033 Acute on chronic diastolic (congestive) heart failure: Secondary | ICD-10-CM | POA: Diagnosis not present

## 2015-09-22 DIAGNOSIS — Z79899 Other long term (current) drug therapy: Secondary | ICD-10-CM | POA: Diagnosis not present

## 2015-09-22 DIAGNOSIS — Z96653 Presence of artificial knee joint, bilateral: Secondary | ICD-10-CM | POA: Insufficient documentation

## 2015-09-22 DIAGNOSIS — Z7984 Long term (current) use of oral hypoglycemic drugs: Secondary | ICD-10-CM | POA: Insufficient documentation

## 2015-09-22 DIAGNOSIS — I2510 Atherosclerotic heart disease of native coronary artery without angina pectoris: Secondary | ICD-10-CM | POA: Diagnosis not present

## 2015-09-22 DIAGNOSIS — Z96642 Presence of left artificial hip joint: Secondary | ICD-10-CM | POA: Insufficient documentation

## 2015-09-22 DIAGNOSIS — J449 Chronic obstructive pulmonary disease, unspecified: Secondary | ICD-10-CM | POA: Diagnosis not present

## 2015-09-22 DIAGNOSIS — Z951 Presence of aortocoronary bypass graft: Secondary | ICD-10-CM | POA: Diagnosis not present

## 2015-09-22 LAB — BASIC METABOLIC PANEL
ANION GAP: 12 (ref 5–15)
BUN: 40 mg/dL — ABNORMAL HIGH (ref 6–20)
CHLORIDE: 95 mmol/L — AB (ref 101–111)
CO2: 22 mmol/L (ref 22–32)
Calcium: 9.6 mg/dL (ref 8.9–10.3)
Creatinine, Ser: 1.13 mg/dL (ref 0.61–1.24)
GFR calc non Af Amer: 57 mL/min — ABNORMAL LOW (ref >60)
GLUCOSE: 203 mg/dL — AB (ref 65–99)
POTASSIUM: 4.6 mmol/L (ref 3.5–5.1)
Sodium: 129 mmol/L — ABNORMAL LOW (ref 135–145)

## 2015-09-22 LAB — CULTURE, BLOOD (ROUTINE X 2)

## 2015-09-22 LAB — CBC
HEMATOCRIT: 33.9 % — AB (ref 39.0–52.0)
HEMOGLOBIN: 10.7 g/dL — AB (ref 13.0–17.0)
MCH: 25.8 pg — AB (ref 26.0–34.0)
MCHC: 31.6 g/dL (ref 30.0–36.0)
MCV: 81.7 fL (ref 78.0–100.0)
Platelets: 289 10*3/uL (ref 150–400)
RBC: 4.15 MIL/uL — AB (ref 4.22–5.81)
RDW: 15.3 % (ref 11.5–15.5)
WBC: 18.4 10*3/uL — ABNORMAL HIGH (ref 4.0–10.5)

## 2015-09-22 LAB — CBG MONITORING, ED: Glucose-Capillary: 202 mg/dL — ABNORMAL HIGH (ref 65–99)

## 2015-09-22 NOTE — ED Notes (Signed)
CBG 202 

## 2015-09-22 NOTE — ED Notes (Signed)
Pt. Given food and drink by EDP

## 2015-09-22 NOTE — ED Notes (Signed)
Pt. Titrated off oxygen by EDP and kept oxygen saturation at 95% on room air.

## 2015-09-22 NOTE — ED Notes (Addendum)
Patient's O2 level remained between 100% and 98% upon ambulation. Ambulated independently with assistance of a walker

## 2015-09-22 NOTE — Discharge Instructions (Signed)
Please contact your primary care provider today and inform them of today's visit and all relevant data. Please strike to obtain follow-up visit this week for reevaluation further management. Please inform them of today's chest x-ray, laboratory analysis including hyponatremia. Please return to the emergency room immediately if he expands any new or worsening signs or symptoms.

## 2015-09-22 NOTE — ED Notes (Signed)
Pt. Transported to xray at this time.  

## 2015-09-22 NOTE — ED Triage Notes (Signed)
Pt. Coming from home via [facility] for high blood sugar since 1am last night and SOB. Pt. D/c yesterday from [facility] and sent home with antibiotics for pneumonia. EMS reports lung sounds clear. Pt. Denies SOB after being put on oxygen. Pt. Does not wear oxygen at home. Pt. Aflutter on 12 lead with EMS at a rate of 76. Pt. Took 15 units of lantus at 2am and 7am. Pt. Does not take regular insulin, but does take metformin and gliperide.

## 2015-09-22 NOTE — Discharge Planning (Addendum)
Talked with patient and with his permission his fiance at this bedside about oxygen.  They were concerned that he did not go home with oxygen when he was dc'd yesterday.  Explained to them that his oxygen levels on RA both at rest and walking, 48 hours prior to discharge did not meet criteria for home oxygen to be paid for by insurance.  Also informed them that at this time at rest patient's oxygen is 97 to 100% on RA which again does not qualify for home oxygen.  Patient just complained of not eating.  Fiance again stated what I had told them to patient.  Both understand that patient will be walked in the ER and if his sats do not fall below 88% he will not meet criteria for oxygen and he will be discharged home.   [phone]

## 2015-09-22 NOTE — ED Notes (Signed)
EDP at bedside  

## 2015-09-22 NOTE — ED Notes (Signed)
Adding pain assessment so I can d/c

## 2015-09-22 NOTE — ED Notes (Signed)
Pt. Family member upset because physician has not seen the patient. Pt. Educated that the PA spoke with them at length and has seen the patient. EDP made aware.

## 2015-09-22 NOTE — ED Provider Notes (Signed)
[facility]-EM[facility]ENCY DEPT Provider Note   CSN: [id number] Arrival date & time: 09/22/15  1015  First Provider Contact:  First MD Initiated Contact with Patient 09/22/15 1030        History   Chief Complaint Chief Complaint  Patient presents with  . Hyperglycemia    HPI Frank Marks is a 82 y.o. male.  HPI   82 year old male presents today with hyperglycemia. Patient has a significant past medical history of COPD, chronic diastolic congestive heart failure, recent UTI, A. fib on Pradaxa him a diabetes, with recent hospital admission for acute respiratory failure and lobar pneumonia. Family notes patient was discharged yesterday, they thought that he would need oxygen at home but did not receive this. Home health services ordered at the time of discharge. They noted patient had some shortness of breath this morning but denied any specific complaints of chest pain, fever, nausea or vomiting. He also notes that over the last day since hospital discharge his blood sugar was up to 500 last night and 300s today. They report that he takes Lantus 15 mg twice daily. They note that he was discharged home on Levaquin every other day and will need another dose tomorrow.   Past Medical History:  Diagnosis Date  . Acute on chronic diastolic CHF (congestive heart failure), NYHA class 3 (HCC) 07/29/2014  . Anxiety    short periods of nervousness, reported since need for heart care, he feels anxious on & off.    . Aortic stenosis    a. s/p bioprosthetic AVR 9/16.  b. Echo 11/16: Mild LVH, EF 50-55%, normal wall motion, indeterminate diastolic function, AVR okay with mean gradient 7 mmHg and no regurgitation, borderline dilated aortic root (38 mm), mildly dilated ascending aorta (40 mm), MAC, mild MR, severe LAE, mildly reduced RVSF, mild to moderate RAE, PASP 46 mmHg, left sided pleural effusion  . Arthritis    "back" (07/29/2014)  . Carotid artery stenosis   . Chronic atrial fibrillation (HCC)   .  Chronic back pain   . Chronic lower back pain   . COPD (chronic obstructive pulmonary disease) (HCC)   . Dysrhythmia    afib  . Hx of cardiovascular stress test    Lex MV 3/14:  Not gated, apical thinning, no ischemia.   Marland Kitchen Hx of echocardiogram 2014   Echo 04/24/12: mild LVH, EF 50-55%, mild AS (mean 9), MAC, mild MR, mod-severe BAE, mod TR, PASP 39  . Hypercholesterolemia   . Hypertension   . Osteoarthritis   . Pneumonia ~ 1951; 02/2014  . Stroke (HCC)    ministroke- 2xs  . TIA (transient ischemic attack) 06/2007   "had 2-3 little ones around that time"  . Type II diabetes mellitus [facility])     Patient Active Problem List   Diagnosis Date Noted  . Acute respiratory failure with hypoxia (HCC) 09/19/2015  . CHF exacerbation (HCC) 09/19/2015  . Pleural effusion 06/28/2015  . Aortic valvar stenosis 12/16/2014  . Aortic valve prolapse 12/16/2014  . Aortic valve replaced 12/16/2014  . S/P CABG (coronary artery bypass graft) 10/29/2014  . Pulmonary nodules 09/22/2014  . Pulmonary hypertension-PA 50 mmHg 07/30/2014  . CAD in native artery- severe- medical Rx 07/30/2014  . Chronic anticoagulation-Pradaxa 07/30/2014  . COPD, moderate (HCC) 07/30/2014  . Acute on chronic diastolic CHF (congestive heart failure), NYHA class 3 (HCC) 07/29/2014  . Essential hypertension 02/13/2014  . UTI (urinary tract infection) 02/13/2014  . Diabetes mellitus type 2 in nonobese (HCC) 02/13/2014  .  Occlusion and stenosis of carotid artery with cerebral infarction 06/15/2010  . Permanent atrial fibrillation (HCC) 04/13/2010    Past Surgical History:  Procedure Laterality Date  . AORTIC VALVE REPLACEMENT N/A 10/29/2014   Procedure: AORTIC VALVE REPLACEMENT (AVR);  Surgeon: Alleen BorneBryan K Bartle, MD;  [facility];  Service: Open Heart Surgery;  Laterality: N/A;  . APPENDECTOMY    . BACK SURGERY     x2   . CARDIAC CATHETERIZATION  07/29/2014  . CARDIAC CATHETERIZATION N/A 07/29/2014   Procedure: Left Heart Cath  and Coronary Angiography;  Surgeon: Tonny BollmanMichael Cooper, MD;  [facility];  Service: Cardiovascular;  Laterality: N/A;  . CATARACT EXTRACTION W/ INTRAOCULAR LENS  IMPLANT, BILATERAL Bilateral   . CORONARY ARTERY BYPASS GRAFT N/A 10/29/2014   Procedure: CORONARY ARTERY BYPASS GRAFTING (CABG);  Surgeon: Alleen BorneBryan K Bartle, MD;  [facility];  Service: Open Heart Surgery;  Laterality: N/A;  . EXPLORATION POST OPERATIVE OPEN HEART N/A 10/29/2014   Procedure: EXPLORATION POST OPERATIVE OPEN HEART;  Surgeon: Alleen BorneBryan K Bartle, MD;  Location: [facility] OR;  Service: Open Heart Surgery;  Laterality: N/A;  . EYE SURGERY     cataracts - removed- bilateral, /w IOL  . JOINT REPLACEMENT    . KNEE ARTHROSCOPY Left   . LUMBAR LAMINECTOMY/DECOMPRESSION MICRODISCECTOMY  1990's X 1; 2000's X1  . REPLACEMENT TOTAL KNEE Bilateral ~ 2001-2002  . TEE WITHOUT CARDIOVERSION N/A 10/29/2014   Procedure: TRANSESOPHAGEAL ECHOCARDIOGRAM (TEE);  Surgeon: Alleen BorneBryan K Bartle, MD;  [facility];  Service: Open Heart Surgery;  Laterality: N/A;  . TONSILLECTOMY    . TOTAL HIP ARTHROPLASTY Left ~ 2003       Home Medications    Prior to Admission medications   Medication Sig Start Date End Date Taking? Authorizing Provider  acetaminophen (TYLENOL) 325 MG tablet Take 2 tablets (650 mg total) by mouth every 6 (six) hours as needed for mild pain (or Fever >/= 101). 09/21/15  Yes Alison MurrayAlma M Devine, MD  aspirin 81 MG tablet Take 81 mg by mouth every morning.    Yes Historical Provider, MD  dabigatran (PRADAXA) 150 MG CAPS capsule Take 1 capsule (150 mg total) by mouth 2 (two) times daily. 02/01/15  Yes Tonny BollmanMichael Cooper, MD  diltiazem (CARDIZEM CD) 240 MG 24 hr capsule TAKE 1 CAPSULE (240 MG TOTAL) BY MOUTH DAILY. Patient taking differently: TAKE 1 CAPSULE (240 MG TOTAL) BY MOUTH every morning 03/29/15  Yes Tonny BollmanMichael Cooper, MD  docusate sodium (COLACE) 100 MG capsule Take 1 capsule (100 mg total) by mouth every 12 (twelve) hours. 03/27/15   Yes [name], DO  ferrous sulfate 325 (65 FE) MG tablet Take 1 tablet (325 mg total) by mouth daily with breakfast. For one month Patient taking differently: Take 325 mg by mouth 2 (two) times daily with a meal. For one month 11/10/14  Yes Donielle Margaretann LovelessM Zimmerman, PA-C  folic acid (FOLVITE) 1 MG tablet Take 1 mg by mouth every evening.    Yes Historical Provider, MD  glimepiride (AMARYL) 2 MG tablet Take 2 mg by mouth daily with breakfast.   Yes Historical Provider, MD  Insulin Glargine (LANTUS SOLOSTAR) 100 UNIT/ML Solostar Pen Inject 15 Units into the skin daily.    Yes Historical Provider, MD  levofloxacin (LEVAQUIN) 750 MG tablet Take 1 tablet (750 mg total) by mouth every other day. 09/21/15  Yes Alison MurrayAlma M Devine, MD  levothyroxine (SYNTHROID, LEVOTHROID) 50 MCG tablet Take 50 mcg by mouth every evening.  07/27/15  Yes Historical Provider, MD  lisinopril (PRINIVIL,ZESTRIL) 20 MG tablet Take 20 mg by mouth every morning.  01/04/15  Yes Historical Provider, MD  metFORMIN (GLUCOPHAGE) 500 MG tablet Take 500 mg by mouth 2 (two) times daily with a meal.   Yes Historical Provider, MD  metoprolol tartrate (LOPRESSOR) 25 MG tablet TAKE 1 TABLET (25 MG TOTAL) BY MOUTH 2 (TWO) TIMES DAILY. 08/25/15  Yes [name], PA-C  NITROSTAT 0.4 MG SL tablet Place 0.4 mg under the tongue every 5 (five) minutes as needed for chest pain. Limit 3 tabs per day 09/04/14  Yes Historical Provider, MD  pravastatin (PRAVACHOL) 80 MG tablet Take 80 mg by mouth every evening.    Yes Historical Provider, MD    Family History Family History  Problem Relation Age of Onset  . Stroke Father   . Healthy Mother   . Arthritis Mother   . Alzheimer's disease Mother   . Other      no family hx of coronary artery disease    Social History Social History  Substance Use Topics  . Smoking status: Former Smoker    Packs/day: 1.00    Years: 5.00    Types: Cigarettes    Quit date: 08/20/1984  . Smokeless tobacco: Former Neurosurgeon     Types: Chew     Comment: "stopped smoking cigarettes in the 1970's; quit chewing ~ 2000"  . Alcohol use No     Allergies   Cefuroxime   Review of Systems Review of Systems  All other systems reviewed and are negative.    Physical Exam Updated Vital Signs BP 135/80   Pulse 87   Temp 97.9 F (36.6 C) (Oral)   Resp 26   Ht 6' (1.829 m)   Wt 82.6 kg   SpO2 94%   BMI 24.68 kg/m   Physical Exam  Constitutional: He is oriented to person, place, and time. He appears well-developed and well-nourished.  HENT:  Head: Normocephalic and atraumatic.  Eyes: Conjunctivae are normal. Pupils are equal, round, and reactive to light. Right eye exhibits no discharge. Left eye exhibits no discharge. No scleral icterus.  Neck: Normal range of motion. No JVD present. No tracheal deviation present.  Cardiovascular: Normal rate.   Pulmonary/Chest: Effort normal and breath sounds normal. No stridor. No respiratory distress. He has no wheezes.  Musculoskeletal: Normal range of motion.  Scant edema to the lower extremities  Neurological: He is alert and oriented to person, place, and time. Coordination normal.  Skin: Skin is warm and dry.  Psychiatric: He has a normal mood and affect. His behavior is normal. Judgment and thought content normal.  Nursing note and vitals reviewed.    ED Treatments / Results  Labs (all labs ordered are listed, but only abnormal results are displayed) Labs Reviewed  BASIC METABOLIC PANEL - Abnormal; Notable for the following:       Result Value   Sodium 129 (*)    Chloride 95 (*)    Glucose, Bld 203 (*)    BUN 40 (*)    GFR calc non Af Amer 57 (*)    All other components within normal limits  CBC - Abnormal; Notable for the following:    WBC 18.4 (*)    RBC 4.15 (*)    Hemoglobin 10.7 (*)    HCT 33.9 (*)    MCH 25.8 (*)    All other components within normal limits  CBG MONITORING, ED - Abnormal; Notable for  the following:    Glucose-Capillary 202 (*)     All other components within normal limits  CBG MONITORING, ED    EKG  EKG Interpretation  Date/Time:  Wednesday September 22 2015 10:23:26 EDT Ventricular Rate:  81 PR Interval:    QRS Duration: 156 QT Interval:  403 QTC Calculation: 468 R Axis:   -71 Text Interpretation:  Atrial flutter with predominant 4:1 AV block Ventricular preexcitation(WPW) No significant change since last tracing Confirmed by ZACKOWSKI  MD, SCOTT [phone]) on 09/22/2015 10:50:47 AM       Radiology Dg Chest 2 View  Result Date: 09/22/2015 CLINICAL DATA:  Shortness of breath.  Hyperglycemia. EXAM: CHEST  2 VIEW COMPARISON:  09/19/2015 and 09/07/2015 FINDINGS: The heart size is normal. Slight pulmonary vascular prominence. Chronic pleural thickening on the left, unchanged. CABG in previous aortic valve replacement. No acute infiltrates or effusions.  No acute bone abnormality. IMPRESSION: Slight pulmonary vascular prominence.  No other acute change. Electronically Signed   By: Francene Boyers M.D.   On: 09/22/2015 12:14    Procedures Procedures (including critical care time)  Medications Ordered in ED Medications - No data to display   Initial Impression / Assessment and Plan / ED Course  I have reviewed the triage vital signs and the nursing notes.  Pertinent labs & imaging results that were available during my care of the patient were reviewed by me and considered in my medical decision making (see chart for details).  Clinical Course    Final Clinical Impressions(s) / ED Diagnoses   Final diagnoses:  Hyperglycemia    Labs: BMP, CBC him a CBG, DG chest 2 view   Imaging: DG chest 2 view  Consults:  Therapeutics:  Discharge Meds:   Assessment/Plan:  82 year old male presents today with complaints of hyperglycemia. Patient was noted to have a blood sugar of 202 here in the emergency room, family notes associated left significantly higher at home. Patient has no signs of DKA. Family is requesting  oxygen as patient was short of breath this morning. When I initially encountered the patient he was on 6 L and satting at 100%, I removed the oxygen, patient remained in the mid 90s throughout his entire stay in the emergency room. Family requesting home oxygen, I informed them that I was unable to fill disorder as patient did not be qualifying criteria for home oxygen. Patient did have low oxygen saturation while during hospital admission, but this was greater than 48 hours and he had improvement in symptoms. Family was uncertain why patient had such poor oxygen saturation earlier and now he is in the mid 90s. I informed them that likely this was related to his COPD exacerbation and it has improved. Patient was ambulated here in the ED, had no signs of decreased oxygen saturation. He is afebrile nontoxic in no acute distress. He has clear lung sounds on my exam. Patient was noted to be slightly hyponatremic here in the ED, but is consistent with previous sodium. Patient's chest x-ray shows improvement over the previous, and he has no clinical signs of heart failure. Patient does not meet admission criteria, and family would not like patient to be admitted. They report they would just like oxygen for patient's comfort at home. Patient will need to follow-up with his primary care provider for further evaluation and management of his ongoing symptoms. Patient was hyperglycemic at home, the need to adjust his insulin if he continues to be hyperglycemic, but I see no reason  to change his insulin while in the ED setting with a blood sugar of 200. Patient understood, his son understood today's plan and agreed for discharge home with primary care follow-up. Had no further questions or concerns at the time of discharge    Note from physical therapy shows oxygen saturation down at 87 with ambulating on room air, oxygen saturation on 3 L while ambulating is 98%. They recommend patient with O2 to maintain O2 sats above 88%  his mobility.  New Prescriptions New Prescriptions   No medications on file     Eyvonne Mechanic, PA-C 09/22/15 1707    Vanetta Mulders, MD 10/01/15 2156

## 2015-09-23 ENCOUNTER — Encounter: Payer: Self-pay | Admitting: Cardiovascular Disease

## 2015-09-24 LAB — CULTURE, BLOOD (ROUTINE X 2): Culture: NO GROWTH

## 2015-09-28 ENCOUNTER — Emergency Department (HOSPITAL_COMMUNITY): Payer: Medicare Other

## 2015-09-28 ENCOUNTER — Inpatient Hospital Stay (HOSPITAL_COMMUNITY)
Admission: EM | Admit: 2015-09-28 | Discharge: 2015-09-30 | DRG: 291 | Disposition: A | Discharge status: Hospice | Payer: Medicare Other | Attending: Internal Medicine | Admitting: Internal Medicine

## 2015-09-28 ENCOUNTER — Encounter (HOSPITAL_COMMUNITY): Payer: Self-pay | Admitting: Emergency Medicine

## 2015-09-28 DIAGNOSIS — Z953 Presence of xenogenic heart valve: Secondary | ICD-10-CM | POA: Diagnosis not present

## 2015-09-28 DIAGNOSIS — E7800 Pure hypercholesterolemia, unspecified: Secondary | ICD-10-CM | POA: Diagnosis present

## 2015-09-28 DIAGNOSIS — L89152 Pressure ulcer of sacral region, stage 2: Secondary | ICD-10-CM | POA: Diagnosis present

## 2015-09-28 DIAGNOSIS — E039 Hypothyroidism, unspecified: Secondary | ICD-10-CM | POA: Diagnosis present

## 2015-09-28 DIAGNOSIS — Z66 Do not resuscitate: Secondary | ICD-10-CM

## 2015-09-28 DIAGNOSIS — J9601 Acute respiratory failure with hypoxia: Secondary | ICD-10-CM | POA: Diagnosis not present

## 2015-09-28 DIAGNOSIS — E1165 Type 2 diabetes mellitus with hyperglycemia: Secondary | ICD-10-CM | POA: Diagnosis present

## 2015-09-28 DIAGNOSIS — Z8673 Personal history of transient ischemic attack (TIA), and cerebral infarction without residual deficits: Secondary | ICD-10-CM | POA: Diagnosis not present

## 2015-09-28 DIAGNOSIS — I2510 Atherosclerotic heart disease of native coronary artery without angina pectoris: Secondary | ICD-10-CM | POA: Diagnosis not present

## 2015-09-28 DIAGNOSIS — Z7901 Long term (current) use of anticoagulants: Secondary | ICD-10-CM | POA: Diagnosis not present

## 2015-09-28 DIAGNOSIS — Z9842 Cataract extraction status, left eye: Secondary | ICD-10-CM

## 2015-09-28 DIAGNOSIS — Z515 Encounter for palliative care: Secondary | ICD-10-CM

## 2015-09-28 DIAGNOSIS — J449 Chronic obstructive pulmonary disease, unspecified: Secondary | ICD-10-CM | POA: Diagnosis not present

## 2015-09-28 DIAGNOSIS — I5033 Acute on chronic diastolic (congestive) heart failure: Secondary | ICD-10-CM | POA: Diagnosis not present

## 2015-09-28 DIAGNOSIS — R0600 Dyspnea, unspecified: Secondary | ICD-10-CM

## 2015-09-28 DIAGNOSIS — F419 Anxiety disorder, unspecified: Secondary | ICD-10-CM | POA: Diagnosis present

## 2015-09-28 DIAGNOSIS — I4892 Unspecified atrial flutter: Secondary | ICD-10-CM | POA: Diagnosis present

## 2015-09-28 DIAGNOSIS — D638 Anemia in other chronic diseases classified elsewhere: Secondary | ICD-10-CM | POA: Diagnosis present

## 2015-09-28 DIAGNOSIS — I110 Hypertensive heart disease with heart failure: Principal | ICD-10-CM | POA: Diagnosis present

## 2015-09-28 DIAGNOSIS — I4821 Permanent atrial fibrillation: Secondary | ICD-10-CM | POA: Diagnosis present

## 2015-09-28 DIAGNOSIS — N390 Urinary tract infection, site not specified: Secondary | ICD-10-CM | POA: Diagnosis present

## 2015-09-28 DIAGNOSIS — R0902 Hypoxemia: Secondary | ICD-10-CM

## 2015-09-28 DIAGNOSIS — I10 Essential (primary) hypertension: Secondary | ICD-10-CM | POA: Diagnosis not present

## 2015-09-28 DIAGNOSIS — R799 Abnormal finding of blood chemistry, unspecified: Secondary | ICD-10-CM

## 2015-09-28 DIAGNOSIS — Z9981 Dependence on supplemental oxygen: Secondary | ICD-10-CM | POA: Diagnosis not present

## 2015-09-28 DIAGNOSIS — R402252 Coma scale, best verbal response, oriented, at arrival to emergency department: Secondary | ICD-10-CM | POA: Diagnosis present

## 2015-09-28 DIAGNOSIS — E119 Type 2 diabetes mellitus without complications: Secondary | ICD-10-CM

## 2015-09-28 DIAGNOSIS — Z794 Long term (current) use of insulin: Secondary | ICD-10-CM

## 2015-09-28 DIAGNOSIS — Z87891 Personal history of nicotine dependence: Secondary | ICD-10-CM | POA: Diagnosis not present

## 2015-09-28 DIAGNOSIS — D72829 Elevated white blood cell count, unspecified: Secondary | ICD-10-CM

## 2015-09-28 DIAGNOSIS — Z7982 Long term (current) use of aspirin: Secondary | ICD-10-CM

## 2015-09-28 DIAGNOSIS — Z96642 Presence of left artificial hip joint: Secondary | ICD-10-CM | POA: Diagnosis present

## 2015-09-28 DIAGNOSIS — R402142 Coma scale, eyes open, spontaneous, at arrival to emergency department: Secondary | ICD-10-CM | POA: Diagnosis present

## 2015-09-28 DIAGNOSIS — Z7189 Other specified counseling: Secondary | ICD-10-CM | POA: Diagnosis not present

## 2015-09-28 DIAGNOSIS — Z961 Presence of intraocular lens: Secondary | ICD-10-CM | POA: Diagnosis present

## 2015-09-28 DIAGNOSIS — Z951 Presence of aortocoronary bypass graft: Secondary | ICD-10-CM

## 2015-09-28 DIAGNOSIS — Z96653 Presence of artificial knee joint, bilateral: Secondary | ICD-10-CM | POA: Diagnosis present

## 2015-09-28 DIAGNOSIS — Z79899 Other long term (current) drug therapy: Secondary | ICD-10-CM

## 2015-09-28 DIAGNOSIS — R0602 Shortness of breath: Secondary | ICD-10-CM | POA: Diagnosis present

## 2015-09-28 DIAGNOSIS — J189 Pneumonia, unspecified organism: Secondary | ICD-10-CM

## 2015-09-28 DIAGNOSIS — R627 Adult failure to thrive: Secondary | ICD-10-CM | POA: Diagnosis present

## 2015-09-28 DIAGNOSIS — R402362 Coma scale, best motor response, obeys commands, at arrival to emergency department: Secondary | ICD-10-CM | POA: Diagnosis present

## 2015-09-28 DIAGNOSIS — Z9841 Cataract extraction status, right eye: Secondary | ICD-10-CM

## 2015-09-28 DIAGNOSIS — D649 Anemia, unspecified: Secondary | ICD-10-CM

## 2015-09-28 DIAGNOSIS — I509 Heart failure, unspecified: Secondary | ICD-10-CM | POA: Diagnosis not present

## 2015-09-28 DIAGNOSIS — I482 Chronic atrial fibrillation: Secondary | ICD-10-CM | POA: Diagnosis present

## 2015-09-28 HISTORY — DX: Urinary tract infection, site not specified: N39.0

## 2015-09-28 LAB — BASIC METABOLIC PANEL
Anion gap: 10 (ref 5–15)
BUN: 35 mg/dL — ABNORMAL HIGH (ref 6–20)
CALCIUM: 9.9 mg/dL (ref 8.9–10.3)
CO2: 24 mmol/L (ref 22–32)
CREATININE: 1.08 mg/dL (ref 0.61–1.24)
Chloride: 101 mmol/L (ref 101–111)
GFR calc Af Amer: >60 mL/min (ref >60)
GFR calc non Af Amer: >60 mL/min (ref >60)
GLUCOSE: 190 mg/dL — AB (ref 65–99)
Potassium: 4.6 mmol/L (ref 3.5–5.1)
Sodium: 135 mmol/L (ref 135–145)

## 2015-09-28 LAB — URINE MICROSCOPIC-ADD ON

## 2015-09-28 LAB — CBC WITH DIFFERENTIAL/PLATELET
BASOS PCT: 0 %
Basophils Absolute: 0 10*3/uL (ref 0.0–0.1)
EOS PCT: 1 %
Eosinophils Absolute: 0.3 10*3/uL (ref 0.0–0.7)
HCT: 31.8 % — ABNORMAL LOW (ref 39.0–52.0)
Hemoglobin: 10.1 g/dL — ABNORMAL LOW (ref 13.0–17.0)
LYMPHS ABS: 0.5 10*3/uL — AB (ref 0.7–4.0)
Lymphocytes Relative: 2 %
MCH: 25.6 pg — AB (ref 26.0–34.0)
MCHC: 31.8 g/dL (ref 30.0–36.0)
MCV: 80.5 fL (ref 78.0–100.0)
MONO ABS: 1.6 10*3/uL — AB (ref 0.1–1.0)
Monocytes Relative: 6 %
NEUTROS ABS: 24.3 10*3/uL — AB (ref 1.7–7.7)
Neutrophils Relative %: 91 %
PLATELETS: 343 10*3/uL (ref 150–400)
RBC: 3.95 MIL/uL — ABNORMAL LOW (ref 4.22–5.81)
RDW: 16.2 % — ABNORMAL HIGH (ref 11.5–15.5)
WBC: 26.7 10*3/uL — ABNORMAL HIGH (ref 4.0–10.5)

## 2015-09-28 LAB — URINALYSIS, ROUTINE W REFLEX MICROSCOPIC
BILIRUBIN URINE: NEGATIVE
GLUCOSE, UA: NEGATIVE mg/dL
Hgb urine dipstick: NEGATIVE
KETONES UR: NEGATIVE mg/dL
LEUKOCYTES UA: NEGATIVE
Nitrite: NEGATIVE
PH: 5 (ref 5.0–8.0)
Protein, ur: 100 mg/dL — AB
Specific Gravity, Urine: 1.022 (ref 1.005–1.030)

## 2015-09-28 LAB — TROPONIN I: Troponin I: <0.03 ng/mL (ref <0.03)

## 2015-09-28 LAB — HEPATIC FUNCTION PANEL
ALT: 27 U/L (ref 17–63)
AST: 26 U/L (ref 15–41)
Albumin: 2.5 g/dL — ABNORMAL LOW (ref 3.5–5.0)
Alkaline Phosphatase: 86 U/L (ref 38–126)
BILIRUBIN DIRECT: 0.1 mg/dL (ref 0.1–0.5)
BILIRUBIN TOTAL: 0.7 mg/dL (ref 0.3–1.2)
Indirect Bilirubin: 0.6 mg/dL (ref 0.3–0.9)
Total Protein: 7.2 g/dL (ref 6.5–8.1)

## 2015-09-28 LAB — BRAIN NATRIURETIC PEPTIDE: B NATRIURETIC PEPTIDE 5: 294.3 pg/mL — AB (ref 0.0–100.0)

## 2015-09-28 LAB — MRSA PCR SCREENING: MRSA by PCR: POSITIVE — AB

## 2015-09-28 LAB — I-STAT CG4 LACTIC ACID, ED
Lactic Acid, Venous: 1.33 mmol/L (ref 0.5–1.9)
Lactic Acid, Venous: 1.03 mmol/L (ref 0.5–1.9)

## 2015-09-28 LAB — MAGNESIUM: Magnesium: 1.5 mg/dL — ABNORMAL LOW (ref 1.7–2.4)

## 2015-09-28 LAB — PROCALCITONIN: Procalcitonin: <0.1 ng/mL

## 2015-09-28 LAB — GLUCOSE, CAPILLARY: Glucose-Capillary: 173 mg/dL — ABNORMAL HIGH (ref 65–99)

## 2015-09-28 MED ORDER — ACETAMINOPHEN 325 MG PO TABS: 650.0000 mg | ORAL_TABLET | Freq: Four times a day (QID) | ORAL | Status: DC | PRN

## 2015-09-28 MED ORDER — ALBUTEROL SULFATE (2.5 MG/3ML) 0.083% IN NEBU: 2.5000 mg | INHALATION_SOLUTION | RESPIRATORY_TRACT | Status: DC | PRN

## 2015-09-28 MED ORDER — DEXTROSE 5 % IV SOLN
2.0000 g | Freq: Once | INTRAVENOUS | Status: AC
  Administered 2015-09-28: 2 g via INTRAVENOUS
  Filled 2015-09-28: qty 2

## 2015-09-28 MED ORDER — DEXTROSE 5 % IV SOLN
2.0000 g | Freq: Three times a day (TID) | INTRAVENOUS | Status: DC
  Administered 2015-09-28 – 2015-09-29 (×4): 2 g via INTRAVENOUS
  Filled 2015-09-28 (×5): qty 2

## 2015-09-28 MED ORDER — METOPROLOL TARTRATE 25 MG PO TABS
25.0000 mg | ORAL_TABLET | Freq: Two times a day (BID) | ORAL | Status: DC
  Administered 2015-09-28 – 2015-09-30 (×5): 25 mg via ORAL
  Filled 2015-09-28 (×5): qty 1

## 2015-09-28 MED ORDER — DABIGATRAN ETEXILATE MESYLATE 150 MG PO CAPS
150.0000 mg | ORAL_CAPSULE | Freq: Two times a day (BID) | ORAL | Status: DC
  Administered 2015-09-28 – 2015-09-30 (×5): 150 mg via ORAL
  Filled 2015-09-28 (×7): qty 1

## 2015-09-28 MED ORDER — INSULIN GLARGINE 100 UNIT/ML ~~LOC~~ SOLN
15.0000 [IU] | Freq: Every day | SUBCUTANEOUS | Status: DC
  Administered 2015-09-28 – 2015-09-30 (×3): 15 [IU] via SUBCUTANEOUS
  Filled 2015-09-28 (×3): qty 0.15

## 2015-09-28 MED ORDER — CHLORHEXIDINE GLUCONATE CLOTH 2 % EX PADS
6.0000 | MEDICATED_PAD | Freq: Every day | CUTANEOUS | Status: DC
  Administered 2015-09-29 – 2015-09-30 (×2): 6 via TOPICAL

## 2015-09-28 MED ORDER — ONDANSETRON HCL 4 MG/2ML IJ SOLN
INTRAMUSCULAR | Status: AC
  Filled 2015-09-28: qty 2

## 2015-09-28 MED ORDER — ASPIRIN 81 MG PO CHEW
81.0000 mg | CHEWABLE_TABLET | Freq: Every day | ORAL | Status: DC
  Administered 2015-09-28 – 2015-09-30 (×3): 81 mg via ORAL
  Filled 2015-09-28 (×3): qty 1

## 2015-09-28 MED ORDER — MAGNESIUM SULFATE 2 GM/50ML IV SOLN
2.0000 g | Freq: Once | INTRAVENOUS | Status: AC
  Administered 2015-09-28: 2 g via INTRAVENOUS
  Filled 2015-09-28: qty 50

## 2015-09-28 MED ORDER — MUPIROCIN 2 % EX OINT
1.0000 "application " | TOPICAL_OINTMENT | Freq: Two times a day (BID) | CUTANEOUS | Status: DC
  Administered 2015-09-28 – 2015-09-30 (×4): 1 via NASAL
  Filled 2015-09-28 (×3): qty 22

## 2015-09-28 MED ORDER — ONDANSETRON HCL 4 MG/2ML IJ SOLN
4.0000 mg | Freq: Once | INTRAMUSCULAR | Status: AC
  Administered 2015-09-28: 4 mg via INTRAVENOUS

## 2015-09-28 MED ORDER — FOLIC ACID 1 MG PO TABS
1.0000 mg | ORAL_TABLET | Freq: Every evening | ORAL | Status: DC
  Administered 2015-09-28 – 2015-09-29 (×2): 1 mg via ORAL
  Filled 2015-09-28 (×2): qty 1

## 2015-09-28 MED ORDER — DILTIAZEM HCL 100 MG IV SOLR
5.0000 mg/h | INTRAVENOUS | Status: DC
  Administered 2015-09-28 (×2): 5 mg/h via INTRAVENOUS
  Filled 2015-09-28 (×2): qty 100

## 2015-09-28 MED ORDER — ACETAMINOPHEN 650 MG RE SUPP: 650.0000 mg | Freq: Four times a day (QID) | RECTAL | Status: DC | PRN

## 2015-09-28 MED ORDER — VANCOMYCIN HCL IN DEXTROSE 750-5 MG/150ML-% IV SOLN
750.0000 mg | Freq: Two times a day (BID) | INTRAVENOUS | Status: DC
  Administered 2015-09-28: 750 mg via INTRAVENOUS
  Filled 2015-09-28 (×3): qty 150

## 2015-09-28 MED ORDER — FUROSEMIDE 10 MG/ML IJ SOLN
40.0000 mg | Freq: Once | INTRAMUSCULAR | Status: AC
  Administered 2015-09-28: 40 mg via INTRAVENOUS
  Filled 2015-09-28: qty 4

## 2015-09-28 MED ORDER — VANCOMYCIN HCL IN DEXTROSE 1-5 GM/200ML-% IV SOLN
1000.0000 mg | Freq: Once | INTRAVENOUS | Status: AC
  Administered 2015-09-28: 1000 mg via INTRAVENOUS
  Filled 2015-09-28: qty 200

## 2015-09-28 MED ORDER — PRAVASTATIN SODIUM 40 MG PO TABS
80.0000 mg | ORAL_TABLET | Freq: Every evening | ORAL | Status: DC
  Administered 2015-09-28 – 2015-09-29 (×2): 80 mg via ORAL
  Filled 2015-09-28 (×2): qty 2

## 2015-09-28 MED ORDER — DILTIAZEM LOAD VIA INFUSION
15.0000 mg | Freq: Once | INTRAVENOUS | Status: AC
  Administered 2015-09-28: 15 mg via INTRAVENOUS
  Filled 2015-09-28: qty 15

## 2015-09-28 MED ORDER — LEVOTHYROXINE SODIUM 50 MCG PO TABS
50.0000 ug | ORAL_TABLET | Freq: Every evening | ORAL | Status: DC
  Administered 2015-09-28 – 2015-09-29 (×2): 50 ug via ORAL
  Filled 2015-09-28 (×2): qty 1

## 2015-09-28 NOTE — ED Triage Notes (Signed)
[facility] EMS reports patient seen at PCP today. Patient wears home oxygen at 2 LPM via nasal cannula. Oxygen at PCP's dropped to 92% on 2 LPM.  When EMS arrived, patient 78-80% on 2 LPM. Placed on NRB. 88-90%. Hands cold - reported by EMS.  Patient has history of A Fib. Heart rate ranging from 70's to 160's. Patient on Cardizem. Hx - Afib, HTN, DM, CABG w/5 stents. No MI.

## 2015-09-28 NOTE — ED Notes (Signed)
RN found patient to be on NRB mask since ~1am last night with 100% oxygen saturation. Pt. Very lethargic, but is arousable at this time. RN notified respiratory tech to come assess patient and oxygen method at this time.

## 2015-09-28 NOTE — ED Notes (Signed)
Dr. Glick at bedside at this time  

## 2015-09-28 NOTE — ED Notes (Signed)
Family at bedside. (Pt sons)

## 2015-09-28 NOTE — Progress Notes (Signed)
Pharmacy Antibiotic Note  Frank Marks is a 82 y.o. male admitted on 09/28/2015 with pneumonia.  Pharmacy has been consulted for vancomycin dosing.  Patient received vancomycin 1g IV x1 in the ED this morning ~0600  Plan: -Vancomycin 750mg  IV every 12 hours.  Goal trough 15-20 mcg/mL. -Aztreonam 2g IV q8h per MD  Height: 6' (182.9 cm) Weight: 175 lb (79.4 kg) IBW/kg (Calculated) : 77.6  Temp (24hrs), Avg:97.8 F (36.6 C), Min:97.5 F (36.4 C), Max:98.2 F (36.8 C)   Recent Labs Lab 09/22/15 1028 09/28/15 0150 09/28/15 0446 09/28/15 0752  WBC 18.4* 26.7*  --   --   CREATININE 1.13 1.08  --   --   LATICACIDVEN  --   --  1.33 1.03    Estimated Creatinine Clearance: 53.9 mL/min (by C-G formula based on SCr of 1.08 mg/dL).    Allergies  Allergen Reactions  . Cefuroxime Swelling    Tongue swelling    Antimicrobials this admission: Vancomycin 8/15 >>  Aztreonam 8/15 >>   Dose adjustments this admission: n/a  Microbiology results: 8/15 BCx: sent 8/15 UCx: sent  8/15 Sputum: sent  8/15 MRSA PCR: positive  Thank you for allowing pharmacy to be a part of this patient's care.  Daily Crate 09/28/2015 7:02 PM

## 2015-09-28 NOTE — Progress Notes (Signed)
Patient seen with personal caregiver and his son in room, patient report feeling better, now on 3liter oxygen, hard of hearing but oriented x3, he is mrsa screening positive, wbc elevated, will add vanc, get ct chest. Swallow eval ordered to r/o aspiration. Mag replaced. Family report edema has gone down, they prefer to go home once medically stable. Home meds need to be verified, son report patient was recently started on a new blood sugar medicine, he could not remember the name.

## 2015-09-28 NOTE — ED Notes (Signed)
Patient placed back on NRB d/t O2 sats dropped to 84% on 4 LPM via nasal cannula. Patient 94% on NRB.

## 2015-09-28 NOTE — ED Notes (Signed)
Patient sitting upright in bed, with son fanning his back, reporting that he felt hot. Heart rate increased while sitting upright to 120's-130's. Ice pack applied to neck & cool rag applied to forehead. Patient more comfortable. Resting in bed. Heart rate decreased to 90's-100's. No distress noted.

## 2015-09-28 NOTE — Progress Notes (Signed)
Rn received call from microbiology patient MRSA PCR screen was positive. Order for contact precautions placed, orange contact precaution sign placed on door with gown and gloves.

## 2015-09-28 NOTE — ED Notes (Signed)
Portable x-ray at bedside at this time.

## 2015-09-28 NOTE — Progress Notes (Signed)
[facility]  Patient Status: Active (receiving services up to time of hospitalization)  [facility] is providing the following services: RN, PT, OT and MSW  If patient discharges after hours, please call [phone].   Frank BourgeoisMarie Marks 09/28/2015, 4:17 PM

## 2015-09-28 NOTE — ED Provider Notes (Addendum)
[facility]-EMERGENCY DEPT Provider Note   CSN: [id number] Arrival date & time:     By signing my name below, I, Frank Marks, attest that this documentation has been prepared under the direction and in the presence of Frank Booze, MD. Electronically Signed: Angelene Marks, ED Scribe. 09/28/15. 2:07 AM.    History   Chief Complaint Chief Complaint  Patient presents with  . Shortness of Breath   HPI Comments: Frank Marks is a 82 y.o. male with a hx of CHF, aortic stenosis, COPD, hypertension, pulmonary hypertension, DM, and A-fib who presents to the Emergency Department by EMS complaining of ongoing moderate shortness of breath onset PTA. He reports associated chest pain during onset. No alleviating factors noted. He states that he took one NTG which provided relief of his chest pain. Per nursing note, pt uses 2 L of home oxygen. No fever or vomiting.    The history is provided by the patient. No language interpreter was used.  Shortness of Breath  This is a new problem. The current episode started less than 1 hour ago. Pertinent negatives include no fever and no vomiting. Treatments tried: NTG. The treatment provided mild relief. He has had prior hospitalizations. Associated medical issues include COPD and heart failure.    Past Medical History:  Diagnosis Date  . Acute on chronic diastolic CHF (congestive heart failure), NYHA class 3 (HCC) 07/29/2014  . Anxiety    short periods of nervousness, reported since need for heart care, he feels anxious on & off.    . Aortic stenosis    a. s/p bioprosthetic AVR 9/16.  b. Echo 11/16: Mild LVH, EF 50-55%, normal wall motion, indeterminate diastolic function, AVR okay with mean gradient 7 mmHg and no regurgitation, borderline dilated aortic root (38 mm), mildly dilated ascending aorta (40 mm), MAC, mild MR, severe LAE, mildly reduced RVSF, mild to moderate RAE, PASP 46 mmHg, left sided pleural effusion  . Arthritis    "back" (07/29/2014)    . Carotid artery stenosis   . Chronic atrial fibrillation (HCC)   . Chronic back pain   . Chronic lower back pain   . COPD (chronic obstructive pulmonary disease) (HCC)   . Dysrhythmia    afib  . Hx of cardiovascular stress test    Lex MV 3/14:  Not gated, apical thinning, no ischemia.   Marland Kitchen Hx of echocardiogram 2014   Echo 04/24/12: mild LVH, EF 50-55%, mild AS (mean 9), MAC, mild MR, mod-severe BAE, mod TR, PASP 39  . Hypercholesterolemia   . Hypertension   . Osteoarthritis   . Pneumonia ~ 1951; 02/2014  . Stroke (HCC)    ministroke- 2xs  . TIA (transient ischemic attack) 06/2007   "had 2-3 little ones around that time"  . Type II diabetes mellitus [facility])     Patient Active Problem List   Diagnosis Date Noted  . Acute respiratory failure with hypoxia (HCC) 09/19/2015  . CHF exacerbation (HCC) 09/19/2015  . Pleural effusion 06/28/2015  . Aortic valvar stenosis 12/16/2014  . Aortic valve prolapse 12/16/2014  . Aortic valve replaced 12/16/2014  . S/P CABG (coronary artery bypass graft) 10/29/2014  . Pulmonary nodules 09/22/2014  . Pulmonary hypertension-PA 50 mmHg 07/30/2014  . CAD in native artery- severe- medical Rx 07/30/2014  . Chronic anticoagulation-Pradaxa 07/30/2014  . COPD, moderate (HCC) 07/30/2014  . Acute on chronic diastolic CHF (congestive heart failure), NYHA class 3 (HCC) 07/29/2014  . Essential hypertension 02/13/2014  . UTI (urinary tract infection) 02/13/2014  .  Diabetes mellitus type 2 in nonobese (HCC) 02/13/2014  . Occlusion and stenosis of carotid artery with cerebral infarction 06/15/2010  . Permanent atrial fibrillation (HCC) 04/13/2010    Past Surgical History:  Procedure Laterality Date  . AORTIC VALVE REPLACEMENT N/A 10/29/2014   Procedure: AORTIC VALVE REPLACEMENT (AVR);  Surgeon: Alleen BorneBryan K Bartle, MD;  [facility];  Service: Open Heart Surgery;  Laterality: N/A;  . APPENDECTOMY    . BACK SURGERY     x2   . CARDIAC CATHETERIZATION  07/29/2014   . CARDIAC CATHETERIZATION N/A 07/29/2014   Procedure: Left Heart Cath and Coronary Angiography;  Surgeon: Tonny BollmanMichael Cooper, MD;  [facility];  Service: Cardiovascular;  Laterality: N/A;  . CATARACT EXTRACTION W/ INTRAOCULAR LENS  IMPLANT, BILATERAL Bilateral   . CORONARY ARTERY BYPASS GRAFT N/A 10/29/2014   Procedure: CORONARY ARTERY BYPASS GRAFTING (CABG);  Surgeon: Alleen BorneBryan K Bartle, MD;  [facility];  Service: Open Heart Surgery;  Laterality: N/A;  . EXPLORATION POST OPERATIVE OPEN HEART N/A 10/29/2014   Procedure: EXPLORATION POST OPERATIVE OPEN HEART;  Surgeon: Alleen BorneBryan K Bartle, MD;  Location: [facility] OR;  Service: Open Heart Surgery;  Laterality: N/A;  . EYE SURGERY     cataracts - removed- bilateral, /w IOL  . JOINT REPLACEMENT    . KNEE ARTHROSCOPY Left   . LUMBAR LAMINECTOMY/DECOMPRESSION MICRODISCECTOMY  1990's X 1; 2000's X1  . REPLACEMENT TOTAL KNEE Bilateral ~ 2001-2002  . TEE WITHOUT CARDIOVERSION N/A 10/29/2014   Procedure: TRANSESOPHAGEAL ECHOCARDIOGRAM (TEE);  Surgeon: Alleen BorneBryan K Bartle, MD;  [facility];  Service: Open Heart Surgery;  Laterality: N/A;  . TONSILLECTOMY    . TOTAL HIP ARTHROPLASTY Left ~ 2003       Home Medications    Prior to Admission medications   Medication Sig Start Date End Date Taking? Authorizing Provider  acetaminophen (TYLENOL) 325 MG tablet Take 2 tablets (650 mg total) by mouth every 6 (six) hours as needed for mild pain (or Fever >/= 101). 09/21/15   Alison MurrayAlma M Devine, MD  aspirin 81 MG tablet Take 81 mg by mouth every morning.     Historical Provider, MD  dabigatran (PRADAXA) 150 MG CAPS capsule Take 1 capsule (150 mg total) by mouth 2 (two) times daily. 02/01/15   Tonny BollmanMichael Cooper, MD  diltiazem (CARDIZEM CD) 240 MG 24 hr capsule TAKE 1 CAPSULE (240 MG TOTAL) BY MOUTH DAILY. Patient taking differently: TAKE 1 CAPSULE (240 MG TOTAL) BY MOUTH every morning 03/29/15   Tonny BollmanMichael Cooper, MD  docusate sodium (COLACE) 100 MG capsule Take 1 capsule  (100 mg total) by mouth every 12 (twelve) hours. 03/27/15   [name], DO  ferrous sulfate 325 (65 FE) MG tablet Take 1 tablet (325 mg total) by mouth daily with breakfast. For one month Patient taking differently: Take 325 mg by mouth 2 (two) times daily with a meal. For one month 11/10/14   Donielle Margaretann LovelessM Zimmerman, PA-C  folic acid (FOLVITE) 1 MG tablet Take 1 mg by mouth every evening.     Historical Provider, MD  glimepiride (AMARYL) 2 MG tablet Take 2 mg by mouth daily with breakfast.    Historical Provider, MD  Insulin Glargine (LANTUS SOLOSTAR) 100 UNIT/ML Solostar Pen Inject 15 Units into the skin daily.     Historical Provider, MD  levofloxacin (LEVAQUIN) 750 MG tablet Take 1 tablet (750 mg total) by mouth every other day. 09/21/15   Alison MurrayAlma M Devine, MD  levothyroxine (SYNTHROID, LEVOTHROID)  50 MCG tablet Take 50 mcg by mouth every evening.  07/27/15   Historical Provider, MD  lisinopril (PRINIVIL,ZESTRIL) 20 MG tablet Take 20 mg by mouth every morning.  01/04/15   Historical Provider, MD  metFORMIN (GLUCOPHAGE) 500 MG tablet Take 500 mg by mouth 2 (two) times daily with a meal.    Historical Provider, MD  metoprolol tartrate (LOPRESSOR) 25 MG tablet TAKE 1 TABLET (25 MG TOTAL) BY MOUTH 2 (TWO) TIMES DAILY. 08/25/15   [name], PA-C  NITROSTAT 0.4 MG SL tablet Place 0.4 mg under the tongue every 5 (five) minutes as needed for chest pain. Limit 3 tabs per day 09/04/14   Historical Provider, MD  pravastatin (PRAVACHOL) 80 MG tablet Take 80 mg by mouth every evening.     Historical Provider, MD    Family History Family History  Problem Relation Age of Onset  . Stroke Father   . Healthy Mother   . Arthritis Mother   . Alzheimer's disease Mother   . Other      no family hx of coronary artery disease    Social History Social History  Substance Use Topics  . Smoking status: Former Smoker    Packs/day: 1.00    Years: 5.00    Types: Cigarettes    Quit date: 08/20/1984  . Smokeless  tobacco: Former NeurosurgeonUser    Types: Chew     Comment: "stopped smoking cigarettes in the 1970's; quit chewing ~ 2000"  . Alcohol use No     Allergies   Cefuroxime   Review of Systems Review of Systems  Constitutional: Negative for fever.  Respiratory: Positive for shortness of breath.   Gastrointestinal: Negative for vomiting.  All other systems reviewed and are negative.    Physical Exam Updated Vital Signs BP 144/92   Pulse (!) 161   Temp 97.8 F (36.6 C) (Axillary)   Resp (!) 39   Ht 6' (1.829 m)   Wt 175 lb (79.4 kg)   SpO2 100%   BMI 23.73 kg/m   Physical Exam  Constitutional: He is oriented to person, place, and time. He appears well-developed and well-nourished.  HENT:  Head: Normocephalic and atraumatic.  Eyes: EOM are normal. Pupils are equal, round, and reactive to light.  Neck: Normal range of motion. Neck supple. No JVD present.  Cardiovascular: Regular rhythm and normal heart sounds.  Tachycardia present.   No murmur heard. Pulmonary/Chest: Effort normal and breath sounds normal. He has no wheezes. He has no rales. He exhibits no tenderness.  Abdominal: Soft. He exhibits no distension and no mass. There is no tenderness.  Musculoskeletal: Normal range of motion. He exhibits no edema.  Lymphadenopathy:    He has no cervical adenopathy.  Neurological: He is alert and oriented to person, place, and time. No cranial nerve deficit. He exhibits normal muscle tone. Coordination normal.  Skin: Skin is warm and dry. No rash noted.  Psychiatric: His behavior is normal. Thought content normal.  Nursing note and vitals reviewed.    ED Treatments / Results  DIAGNOSTIC STUDIES: Oxygen Saturation is 100% on RA, normal by my interpretation.    COORDINATION OF CARE: 2:04 AM- Pt advised of plan for treatment and pt agrees. Pt will receive Cardizem. He will also receive chest x-ray and lab work for further evaluation.     Labs (all labs ordered are listed, but only  abnormal results are displayed) Labs Reviewed  BASIC METABOLIC PANEL - Abnormal; Notable for the following:  Result Value   Glucose, Bld 190 (*)    BUN 35 (*)    All other components within normal limits  CBC WITH DIFFERENTIAL/PLATELET - Abnormal; Notable for the following:    WBC 26.7 (*)    RBC 3.95 (*)    Hemoglobin 10.1 (*)    HCT 31.8 (*)    MCH 25.6 (*)    RDW 16.2 (*)    Neutro Abs 24.3 (*)    Lymphs Abs 0.5 (*)    Monocytes Absolute 1.6 (*)    All other components within normal limits  BRAIN NATRIURETIC PEPTIDE - Abnormal; Notable for the following:    B Natriuretic Peptide 294.3 (*)    All other components within normal limits  HEPATIC FUNCTION PANEL - Abnormal; Notable for the following:    Albumin 2.5 (*)    All other components within normal limits  CULTURE, BLOOD (ROUTINE X 2)  CULTURE, BLOOD (ROUTINE X 2)  URINE CULTURE  TROPONIN I  PROCALCITONIN  URINALYSIS, ROUTINE W REFLEX MICROSCOPIC (NOT AT [facility])  I-STAT CG4 LACTIC ACID, ED    EKG  EKG Interpretation  Date/Time:  Tuesday September 28 2015 01:26:55 EDT Ventricular Rate:  160 PR Interval:    QRS Duration: 129 QT Interval:  305 QTC Calculation: 498 R Axis:   -87 Text Interpretation:  Atrial flutter with 2 to 1 block RBBB and LAFB When compared with ECG of 09/22/2015, HEART RATE has increased Confirmed by Preston Fleeting  MD, Janylah Belgrave ([id number]) on 09/28/2015 1:37:12 AM       Radiology Dg Chest Port 1 View  Result Date: 09/28/2015 CLINICAL DATA:  82 y/o M; shortness of breath and tachycardia tonight. EXAM: PORTABLE CHEST 1 VIEW COMPARISON:  Chest radiograph dated 09/22/2015. FINDINGS: There is blunting of the costophrenic sulci and patchy opacities in the right greater than left lungs which are increased in comparison with the prior chest radiograph. No pneumothorax. Calcific aortic atherosclerosis of the arch. Aortic valve replacement. Post median sternotomy with wires and alignment. No acute osseous abnormality is  evident. Stable cardiac silhouette given differences in technique. IMPRESSION: Increased patchy opacities of the lungs, right greater than left, likely represents pulmonary edema. Underlying pneumonia is not excluded. Probable small bilateral pleural effusions. Electronically Signed   By: Mitzi Hansen M.D.   On: 09/28/2015 02:43    Procedures Procedures (including critical care time) CRITICAL CARE Performed by: UEAVW,[email or web address] Total critical care time: 75 minutes Critical care time was exclusive of separately billable procedures and treating other patients. Critical care was necessary to treat or prevent imminent or life-threatening deterioration. Critical care was time spent personally by me on the following activities: development of treatment plan with patient and/or surrogate as well as nursing, discussions with consultants, evaluation of patient's response to treatment, examination of patient, obtaining history from patient or surrogate, ordering and performing treatments and interventions, ordering and review of laboratory studies, ordering and review of radiographic studies, pulse oximetry and re-evaluation of patient's condition.   Medications Ordered in ED Medications  diltiazem (CARDIZEM) 1 mg/mL load via infusion 15 mg (not administered)    And  diltiazem (CARDIZEM) 100 mg in dextrose 5 % 100 mL (1 mg/mL) infusion (not administered)     Initial Impression / Assessment and Plan / ED Course  Frank Booze, MD has reviewed the triage vital signs and the nursing notes.  Pertinent labs & imaging results that were available during my care of the patient were reviewed by me and considered in my medical  decision making (see chart for details).  Clinical Course   Patient presented with rapid atrial flutter. Old records were reviewed and he is noted to be in chronic atrial flutter but usually is in 3:1 or 4:1 conduction. He is currently in 2:1 conduction. Because he has chronic  atrial flutter, he is not likely to respond to cardioversion. He is on a beta blocker but not a calcium channel blocker, so I started him on diltiazem with successful reduction in heart rate. With this, he is resting more comfortably. Chest x-ray is consistent with CHF or pneumonia. He is noted to have a a very high WBC and it is elected to treat him for possible healthcare associated pneumonia. He had been admitted to the hospital with a CHF exacerbation only last week. BNP is below the level that it had been and his hospital admission. Because of recent congestive heart failure, I will hold off on early goal-directed fluid therapy and less lactate is significantly elevated.  Lactic acid level has come back normal. He has a mild anemia which is unchanged from baseline. Mild elevation of BUN is also unchanged. Case is discussed with Dr. Maryfrances Bunnell of [facility] who agrees to admit the patient.  CHADS-VASC Score =  6 Final Clinical Impressions(s) / ED Diagnoses   Final diagnoses:  Atrial flutter with rapid ventricular response (HCC)  Dyspnea  Hypoxia  HCAP (healthcare-associated pneumonia)  Normocytic anemia  Leukocytosis  Elevated BUN   I personally performed the services described in this documentation, which was scribed in my presence. The recorded information has been reviewed and is accurate.      New Prescriptions New Prescriptions   No medications on file     Frank Booze, MD 09/28/15 1610    Frank Booze, MD 09/28/15 [phone]

## 2015-09-28 NOTE — H&P (Signed)
History and Physical  Patient Name: Frank Marks     WUJ:[id number]    DOB: [birth year]    DOA: 09/28/2015 PCP: Ailene Ravel, MD   Patient coming from: Home  Chief Complaint: Dyspnea  HPI: Frank Marks is a 82 y.o. male with a past medical history significant for COPD, chronic diastolic CHF, recent UTI, Afib on Pradaxa, hypothyroidism, and IDDM who presents with acute respiratory difficulty and hypoxia.  All history collected from chart review and from family at the bedside, as patient is sluggish and unable to provide meaningful history for himself.  The patient was admitted for 2 days last week for acute on chronic hypoxic respiratory failure, ultimately thought to be due to COPD flare, possible pneumonia.  A blood culture was positive for suspected contaminant and he was discharged on Levaquin every other day, which he just completed.  Despite this, since discharge, family note he has had a steady decline.  Was in the ER for respiratory distress the day after discharge, but appeared benign and was discharged from the ED.  HH PT came to the house, but he could barely walk a few feet before getting too tired.  Family note that over the last few days he has appeared more "dazed", weak and "out of it".  They saw his PCP yesterday who recommended Hospice evaluation, diagnosis presumably FTT in the setting of COPD and diastolic CHF.  Tonight, he was at home and family noted increased respiratory effort.  EMS were called who found him hypoxic to the 70s and 80s on 2L [place] and with cold extremities, normalized on NRB.  ECG showed Afib with rates in the 160s, and so he was brought to the ER.    ED course: -Afebrile, heart rate 160s, respirations >30, BP 140/110 initially and saturating 100% on NRB -Na 135, K 4.6, Cr 1.1 (baseline 1.2), WBC 26K, Hgb 10.1 -CXR showed worsening R>L pulmonary opacities, and BNP was moderately high -Cultures were drawn and broad spectrum antibiotics were administered  for possible sepsis from pneumonia as well as   As noted in last H&P, the patient has chronic dyspnea with episodes of rapid breathing for months, believed to be multifactorial from COPD, chronic diastolic CHF and deconditioning and NOT from his old pleural effusions.          ROS: Review of Systems  Constitutional: Positive for malaise/fatigue. Negative for chills and fever.  HENT: Negative.   Eyes: Negative.   Respiratory: Positive for shortness of breath. Negative for cough, hemoptysis, sputum production and wheezing.   Cardiovascular: Positive for leg swelling.  Gastrointestinal: Negative.   Genitourinary: Positive for frequency. Negative for dysuria, flank pain, hematuria and urgency.  Musculoskeletal: Negative.   Skin: Negative.   Neurological: Negative.        Inattentive, dazed  Endo/Heme/Allergies: Negative.   Psychiatric/Behavioral: Negative.         Past Medical History:  Diagnosis Date  . Acute on chronic diastolic CHF (congestive heart failure), NYHA class 3 (HCC) 07/29/2014  . Anxiety    short periods of nervousness, reported since need for heart care, he feels anxious on & off.    . Aortic stenosis    a. s/p bioprosthetic AVR 9/16.  b. Echo 11/16: Mild LVH, EF 50-55%, normal wall motion, indeterminate diastolic function, AVR okay with mean gradient 7 mmHg and no regurgitation, borderline dilated aortic root (38 mm), mildly dilated ascending aorta (40 mm), MAC, mild MR, severe LAE, mildly reduced RVSF, mild to moderate  RAE, PASP 46 mmHg, left sided pleural effusion  . Arthritis    "back" (07/29/2014)  . Carotid artery stenosis   . Chronic atrial fibrillation (HCC)   . Chronic back pain   . Chronic lower back pain   . COPD (chronic obstructive pulmonary disease) (HCC)   . Dysrhythmia    afib  . Hx of cardiovascular stress test    Lex MV 3/14:  Not gated, apical thinning, no ischemia.   Marland Kitchen. Hx of echocardiogram 2014   Echo 04/24/12: mild LVH, EF 50-55%, mild AS  (mean 9), MAC, mild MR, mod-severe BAE, mod TR, PASP 39  . Hypercholesterolemia   . Hypertension   . Osteoarthritis   . Pneumonia ~ 1951; 02/2014  . Stroke (HCC)    ministroke- 2xs  . TIA (transient ischemic attack) 06/2007   "had 2-3 little ones around that time"  . Type II diabetes mellitus (HCC)     Past Surgical History:  Procedure Laterality Date  . AORTIC VALVE REPLACEMENT N/A 10/29/2014   Procedure: AORTIC VALVE REPLACEMENT (AVR);  Surgeon: Alleen BorneBryan K Bartle, MD;  [facility];  Service: Open Heart Surgery;  Laterality: N/A;  . APPENDECTOMY    . BACK SURGERY     x2   . CARDIAC CATHETERIZATION  07/29/2014  . CARDIAC CATHETERIZATION N/A 07/29/2014   Procedure: Left Heart Cath and Coronary Angiography;  Surgeon: Tonny BollmanMichael Cooper, MD;  [facility];  Service: Cardiovascular;  Laterality: N/A;  . CATARACT EXTRACTION W/ INTRAOCULAR LENS  IMPLANT, BILATERAL Bilateral   . CORONARY ARTERY BYPASS GRAFT N/A 10/29/2014   Procedure: CORONARY ARTERY BYPASS GRAFTING (CABG);  Surgeon: Alleen BorneBryan K Bartle, MD;  [facility];  Service: Open Heart Surgery;  Laterality: N/A;  . EXPLORATION POST OPERATIVE OPEN HEART N/A 10/29/2014   Procedure: EXPLORATION POST OPERATIVE OPEN HEART;  Surgeon: Alleen BorneBryan K Bartle, MD;  Location: [facility] OR;  Service: Open Heart Surgery;  Laterality: N/A;  . EYE SURGERY     cataracts - removed- bilateral, /w IOL  . JOINT REPLACEMENT    . KNEE ARTHROSCOPY Left   . LUMBAR LAMINECTOMY/DECOMPRESSION MICRODISCECTOMY  1990's X 1; 2000's X1  . REPLACEMENT TOTAL KNEE Bilateral ~ 2001-2002  . TEE WITHOUT CARDIOVERSION N/A 10/29/2014   Procedure: TRANSESOPHAGEAL ECHOCARDIOGRAM (TEE);  Surgeon: Alleen BorneBryan K Bartle, MD;  [facility];  Service: Open Heart Surgery;  Laterality: N/A;  . TONSILLECTOMY    . TOTAL HIP ARTHROPLASTY Left ~ 2003    Social History: Patient lives by himself but family stay with him during the day.  The patient walks with a cane.  He needs help with the  bathroom chronically, but this week has needed help with dressing and all other ADLs because he has been so weak with his UTI.  Remote former smoker.  Used to work in [employer].  Allergies  Allergen Reactions  . Cefuroxime Swelling    Tongue swelling    Family history: family history includes Alzheimer's disease in his mother; Arthritis in his mother; Healthy in his mother; Stroke in his father.  Prior to Admission medications   Medication Sig Start Date End Date Taking? Authorizing Provider  acetaminophen (TYLENOL) 325 MG tablet Take 2 tablets (650 mg total) by mouth every 6 (six) hours as needed for mild pain (or Fever >/= 101). 09/21/15  Yes Alison MurrayAlma M Devine, MD  aspirin 81 MG tablet Take 81 mg by mouth every morning.    Yes Historical Provider, MD  dabigatran (PRADAXA) 150  MG CAPS capsule Take 1 capsule (150 mg total) by mouth 2 (two) times daily. 02/01/15  Yes [name], MD  diltiazem (CARDIZEM CD) 240 MG 24 hr capsule TAKE 1 CAPSULE (240 MG TOTAL) BY MOUTH DAILY. Patient taking differently: TAKE 1 CAPSULE (240 MG TOTAL) BY MOUTH every morning 03/29/15  Yes [name], MD  docusate sodium (COLACE) 100 MG capsule Take 1 capsule (100 mg total) by mouth every 12 (twelve) hours. 03/27/15  Yes [name], DO  ferrous sulfate 325 (65 FE) MG tablet Take 1 tablet (325 mg total) by mouth daily with breakfast. For one month Patient taking differently: Take 325 mg by mouth 2 (two) times daily with a meal. For one month 11/10/14  Yes [name], PA-C  folic acid (FOLVITE) 1 MG tablet Take 1 mg by mouth every evening.    Yes Historical Provider, MD  glimepiride (AMARYL) 2 MG tablet Take 2 mg by mouth daily with breakfast.   Yes Historical Provider, MD  Insulin Glargine (LANTUS SOLOSTAR) 100 UNIT/ML Solostar Pen Inject 15 Units into the skin daily.    Yes Historical Provider, MD  levothyroxine (SYNTHROID, LEVOTHROID) 50 MCG tablet Take 50 mcg by mouth every evening.  07/27/15  Yes  Historical Provider, MD  lisinopril (PRINIVIL,ZESTRIL) 20 MG tablet Take 20 mg by mouth every morning.  01/04/15  Yes Historical Provider, MD  metFORMIN (GLUCOPHAGE) 500 MG tablet Take 500 mg by mouth 2 (two) times daily with a meal.   Yes Historical Provider, MD  metoprolol tartrate (LOPRESSOR) 25 MG tablet TAKE 1 TABLET (25 MG TOTAL) BY MOUTH 2 (TWO) TIMES DAILY. 08/25/15  Yes [name], PA-C  NITROSTAT 0.4 MG SL tablet Place 0.4 mg under the tongue every 5 (five) minutes as needed for chest pain. Limit 3 tabs per day 09/04/14  Yes Historical Provider, MD  pravastatin (PRAVACHOL) 80 MG tablet Take 80 mg by mouth every evening.    Yes Historical Provider, MD  levofloxacin (LEVAQUIN) 750 MG tablet Take 1 tablet (750 mg total) by mouth every other day. Patient not taking: Reported on 09/28/2015 09/21/15   Alison Murray, MD       Physical Exam: BP 132/83   Pulse 101   Temp 97.8 F (36.6 C) (Axillary)   Resp (!) 34   Ht 6' (1.829 m)   Wt 79.4 kg (175 lb)   SpO2 98%   BMI 23.73 kg/m  General appearance: Frail elderly adult male, weak, tired, HOH.   Eyes: Anicteric, conjunctiva pink, lids and lashes normal.     ENT: No nasal deformity, discharge, or epistaxis.  OP moist without lesions.  Edentulous. Skin: Warm and dry.  No jaundice.  No suspicious rashes or lesions. Cardiac: Irregularly irregular, tachycardic, nl S1-S2, no murmurs appreciated.  Capillary refill is sluggish.  JVP not visible.  Marked bilateral LE edema.  Radial and DP pulses 2+ and symmetric. Respiratory: Rapid respirations.  On non-rebreather.  Rales in anterior fields bilaterally, RIGHT > LEFT.   GI: Abdomen soft without rigidity.  No TTP. No ascites, distension, hepatosplenomegaly.   MSK: No deformities or effusions.  No clubbing/cyanosis. Neuro: Cranial nerves normal except HOH.  Oriented to place, not time.  Globally weak.    Psych: Affect tired.  Judgment and insight appear impaired by global decreased cognition.        Labs on Admission:  I have personally reviewed following labs and imaging studies: CBC:  Recent Labs Lab 09/22/15 1028 09/28/15 0150  WBC 18.4* 26.7*  NEUTROABS  --  24.3*  HGB 10.7* 10.1*  HCT 33.9* 31.8*  MCV 81.7 80.5  PLT 289 343   Basic Metabolic Panel:  Recent Labs Lab 09/22/15 1028 09/28/15 0150  NA 129* 135  K 4.6 4.6  CL 95* 101  CO2 22 24  GLUCOSE 203* 190*  BUN 40* 35*  CREATININE 1.13 1.08  CALCIUM 9.6 9.9   GFR: Estimated Creatinine Clearance: 53.9 mL/min (by C-G formula based on SCr of 1.08 mg/dL).  Liver Function Tests:  Recent Labs Lab 09/28/15 0150  AST 26  ALT 27  ALKPHOS 86  BILITOT 0.7  PROT 7.2  ALBUMIN 2.5*   Cardiac Enzymes:  Recent Labs Lab 09/28/15 0150  TROPONINI <0.03   BNP (last 3 results) 294 pg/mL   CBG:  Recent Labs Lab 09/21/15 0703 09/21/15 1201 09/22/15 1027  GLUCAP 161* 235* 202*   Sepsis Labs: Lactic acid 1.33 mmol/L  Recent Results (from the past 240 hour(s))  Culture, blood (Routine X 2) w Reflex to ID Panel     Status: Abnormal   Collection Time: 09/19/15  3:56 AM  Result Value Ref Range Status   Specimen Description BLOOD RIGHT ARM  Final   Special Requests BOTTLES DRAWN AEROBIC AND ANAEROBIC 7CC   Final   Culture  Setup Time   Final    ANAEROBIC BOTTLE ONLY GRAM POSITIVE COCCI IN CLUSTERS CRITICAL RESULT CALLED TO, READ BACK BY AND VERIFIED WITH: CRITICAL RESULT CALLED TO, READ BACK BY AND VERIFIED WITH: J FRENS,PHARMD AT 4010 09/20/15 BY L BENFIELD    Culture (A)  Final    STAPHYLOCOCCUS SPECIES (COAGULASE NEGATIVE) THE SIGNIFICANCE OF ISOLATING THIS ORGANISM FROM A SINGLE SET OF BLOOD CULTURES WHEN MULTIPLE SETS ARE DRAWN IS UNCERTAIN. PLEASE NOTIFY THE MICROBIOLOGY DEPARTMENT WITHIN ONE WEEK IF SPECIATION AND SENSITIVITIES ARE REQUIRED.    Report Status 09/22/2015 FINAL  Final  Blood Culture ID Panel (Reflexed)     Status: Abnormal   Collection Time: 09/19/15  3:56 AM  Result Value  Ref Range Status   Enterococcus species NOT DETECTED NOT DETECTED Final   Vancomycin resistance NOT DETECTED NOT DETECTED Final   Listeria monocytogenes NOT DETECTED NOT DETECTED Final   Staphylococcus species DETECTED (A) NOT DETECTED Final    Comment: CRITICAL RESULT CALLED TO, READ BACK BY AND VERIFIED WITH: J FRENS,PHARMD AT 0826 09/20/15 BY L BENFIELD    Staphylococcus aureus NOT DETECTED NOT DETECTED Final   Methicillin resistance NOT DETECTED NOT DETECTED Final   Streptococcus species NOT DETECTED NOT DETECTED Final   Streptococcus agalactiae NOT DETECTED NOT DETECTED Final   Streptococcus pneumoniae NOT DETECTED NOT DETECTED Final   Streptococcus pyogenes NOT DETECTED NOT DETECTED Final   Acinetobacter baumannii NOT DETECTED NOT DETECTED Final   Enterobacteriaceae species NOT DETECTED NOT DETECTED Final   Enterobacter cloacae complex NOT DETECTED NOT DETECTED Final   Escherichia coli NOT DETECTED NOT DETECTED Final   Klebsiella oxytoca NOT DETECTED NOT DETECTED Final   Klebsiella pneumoniae NOT DETECTED NOT DETECTED Final   Proteus species NOT DETECTED NOT DETECTED Final   Serratia marcescens NOT DETECTED NOT DETECTED Final   Carbapenem resistance NOT DETECTED NOT DETECTED Final   Haemophilus influenzae NOT DETECTED NOT DETECTED Final   Neisseria meningitidis NOT DETECTED NOT DETECTED Final   Pseudomonas aeruginosa NOT DETECTED NOT DETECTED Final   Candida albicans NOT DETECTED NOT DETECTED Final   Candida glabrata NOT DETECTED NOT DETECTED Final   Candida krusei NOT  DETECTED NOT DETECTED Final   Candida parapsilosis NOT DETECTED NOT DETECTED Final   Candida tropicalis NOT DETECTED NOT DETECTED Final  Culture, blood (Routine X 2) w Reflex to ID Panel     Status: None   Collection Time: 09/19/15  4:08 AM  Result Value Ref Range Status   Specimen Description BLOOD RIGHT HAND  Final   Special Requests IN PEDIATRIC BOTTLE 4CC  Final   Culture NO GROWTH 5 DAYS  Final   Report  Status 09/24/2015 FINAL  Final  MRSA PCR Screening     Status: Abnormal   Collection Time: 09/19/15  6:57 AM  Result Value Ref Range Status   MRSA by PCR POSITIVE (A) NEGATIVE Final    Comment:        The GeneXpert MRSA Assay (FDA approved for NASAL specimens only), is one component of a comprehensive MRSA colonization surveillance program. It is not intended to diagnose MRSA infection nor to guide or monitor treatment for MRSA infections. RCRV RESULT CALLED TO, READ BACK BY AND VERIFIED WITH: M. SURLES 0830 08.06.17 N. MORRIS    Urine culture     Status: None   Collection Time: 09/20/15  6:28 AM  Result Value Ref Range Status   Specimen Description URINE, RANDOM  Final   Special Requests NONE  Final   Culture NO GROWTH  Final   Report Status 09/21/2015 FINAL  Final         Radiological Exams on Admission: Personally reviewed: Dg Chest Port 1 View  Result Date: 09/28/2015 CLINICAL DATA:  82 y/o M; shortness of breath and tachycardia tonight. EXAM: PORTABLE CHEST 1 VIEW COMPARISON:  Chest radiograph dated 09/22/2015. FINDINGS: There is blunting of the costophrenic sulci and patchy opacities in the right greater than left lungs which are increased in comparison with the prior chest radiograph. No pneumothorax. Calcific aortic atherosclerosis of the arch. Aortic valve replacement. Post median sternotomy with wires and alignment. No acute osseous abnormality is evident. Stable cardiac silhouette given differences in technique. IMPRESSION: Increased patchy opacities of the lungs, right greater than left, likely represents pulmonary edema. Underlying pneumonia is not excluded. Probable small bilateral pleural effusions. Electronically Signed   By: Mitzi Hansen M.D.   On: 09/28/2015 02:43    EKG: Independently reviewed. Afib, rate 160 with IVCD.    Assessment/Plan 1. Acute respiratory failure with hypoxia:  Suspect this is acutely worse from Afib with RVR causing acute  on chronic diastolic CHF. Possibly UTI is underlying inciting factor, given urinary symptoms described by family.  Antibiotics delivered in the ED for possible pneumonia but HCAP doubted in setting of normal procalcitonin and absence of fever, cough, sputum. -Diltiazem gtt for rate control -Initiate diuresis with furosemide -Continue aztreonam for possible UTI, follow UA and culture and de-escalate if able    2. Atrial fibrillation with RVR:  CHADS2Vasc 6.  On Pradaxa and rate control.  Currently in rapid rate on admission, suspect from infection and hypervolemia. -Continue diltiazem drip -Continue oral metoprolol with hold parameters -Continue dabigatran  3. Acute on chronic diastolic CHF:  EF normal last November at 50%.  BNP mid-range.   -Re-dose furosemide 40 mg IV today and then continue daily -Strict I/Os and daily weights -Daily BMP  4. HTN and hx of CAD:  BP normal range at admission.  Slight drop with initiating diltiazem, now stable. -Continue metoprolol with hold parameters -Diltiazem drip -Hold lisinopril for now -Continue statin  5. COPD:  Not on inhalers at home.  FEV1 69% in 2016. -Albuterol PRN  6. IDDM: -Continue home glargine 15 units nightly -SSI as needed  7. Anemia: Stable.  Suspect anemia of chronic disease. -Hold iron for now, resume at discharge  8. Hypothyroidism: -Continue levothyroxine     DVT prophylaxis: Not needed, on Pradaxa  Code Status: FULL  Family Communication: Sons both at bedside  Disposition Plan: Anticipate diuresis and monitor weights/I/Os.  Antibiotics for possible UTI, follow urine studies. Consults called: None Admission status: INPATIENT, stepdown    Medical decision making: Patient seen at 4:00 AM on 09/28/2015.  The patient was discussed with Dr. Preston FleetingGlick. What exists of the patient's chart was reviewed in depth.  Clinical condition: requiring Non-rebreather for oxygenation as well as diltiazem drip for Afib with RVR.         Alberteen SamChristopher P Tiquan Bouch [facility] Pager [phone]

## 2015-09-28 NOTE — Progress Notes (Signed)
Patient noted with many pauses 3-4 seconds and frequent PVC 12-15. SBP now 101/55.  Patient is sleeping. Difficult to arouse but caregiver at bedside says that his baseline when patient is sleep. Patient is fully coherent when aroused and states, "I feel ok. No chest pain" Diltiazem continued on hold. On call [facility] updated. RR RN updated as well. Will continue to monitor an endorse.

## 2015-09-29 ENCOUNTER — Encounter (HOSPITAL_COMMUNITY): Payer: Self-pay | Admitting: Radiology

## 2015-09-29 ENCOUNTER — Inpatient Hospital Stay (HOSPITAL_COMMUNITY): Payer: Medicare Other

## 2015-09-29 DIAGNOSIS — Z7189 Other specified counseling: Secondary | ICD-10-CM

## 2015-09-29 DIAGNOSIS — I482 Chronic atrial fibrillation: Secondary | ICD-10-CM

## 2015-09-29 DIAGNOSIS — Z66 Do not resuscitate: Secondary | ICD-10-CM

## 2015-09-29 DIAGNOSIS — I10 Essential (primary) hypertension: Secondary | ICD-10-CM

## 2015-09-29 DIAGNOSIS — I509 Heart failure, unspecified: Secondary | ICD-10-CM

## 2015-09-29 DIAGNOSIS — J9601 Acute respiratory failure with hypoxia: Secondary | ICD-10-CM

## 2015-09-29 DIAGNOSIS — Z515 Encounter for palliative care: Secondary | ICD-10-CM

## 2015-09-29 DIAGNOSIS — I5033 Acute on chronic diastolic (congestive) heart failure: Secondary | ICD-10-CM

## 2015-09-29 LAB — BASIC METABOLIC PANEL
Anion gap: 6 (ref 5–15)
BUN: 34 mg/dL — AB (ref 6–20)
CO2: 28 mmol/L (ref 22–32)
Calcium: 9.3 mg/dL (ref 8.9–10.3)
Chloride: 98 mmol/L — ABNORMAL LOW (ref 101–111)
Creatinine, Ser: 1.09 mg/dL (ref 0.61–1.24)
GFR calc Af Amer: >60 mL/min (ref >60)
GFR, EST NON AFRICAN AMERICAN: 59 mL/min — AB (ref >60)
GLUCOSE: 217 mg/dL — AB (ref 65–99)
POTASSIUM: 4.5 mmol/L (ref 3.5–5.1)
Sodium: 132 mmol/L — ABNORMAL LOW (ref 135–145)

## 2015-09-29 LAB — MAGNESIUM: Magnesium: 1.9 mg/dL (ref 1.7–2.4)

## 2015-09-29 LAB — GLUCOSE, CAPILLARY
GLUCOSE-CAPILLARY: 309 mg/dL — AB (ref 65–99)
GLUCOSE-CAPILLARY: 173 mg/dL — AB (ref 65–99)
Glucose-Capillary: 116 mg/dL — ABNORMAL HIGH (ref 65–99)

## 2015-09-29 LAB — CBC
HEMATOCRIT: 27.4 % — AB (ref 39.0–52.0)
Hemoglobin: 8.3 g/dL — ABNORMAL LOW (ref 13.0–17.0)
MCH: 24.9 pg — AB (ref 26.0–34.0)
MCHC: 30.3 g/dL (ref 30.0–36.0)
MCV: 82.3 fL (ref 78.0–100.0)
Platelets: 300 10*3/uL (ref 150–400)
RBC: 3.33 MIL/uL — ABNORMAL LOW (ref 4.22–5.81)
RDW: 16.6 % — AB (ref 11.5–15.5)
WBC: 16.8 10*3/uL — ABNORMAL HIGH (ref 4.0–10.5)

## 2015-09-29 LAB — ECHOCARDIOGRAM COMPLETE
HEIGHTINCHES: 72 in
WEIGHTICAEL: 2991.2 [oz_av]

## 2015-09-29 LAB — EXPECTORATED SPUTUM ASSESSMENT W GRAM STAIN, RFLX TO RESP C

## 2015-09-29 LAB — URINE CULTURE: Culture: NO GROWTH

## 2015-09-29 LAB — EXPECTORATED SPUTUM ASSESSMENT W REFEX TO RESP CULTURE

## 2015-09-29 MED ORDER — DILTIAZEM HCL ER COATED BEADS 120 MG PO CP24: 120.0000 mg | ORAL_CAPSULE | Freq: Every day | ORAL | Status: DC

## 2015-09-29 MED ORDER — INSULIN ASPART 100 UNIT/ML ~~LOC~~ SOLN
0.0000 [IU] | Freq: Three times a day (TID) | SUBCUTANEOUS | Status: DC
  Administered 2015-09-29: 11 [IU] via SUBCUTANEOUS
  Administered 2015-09-29: 3 [IU] via SUBCUTANEOUS
  Administered 2015-09-30: 8 [IU] via SUBCUTANEOUS

## 2015-09-29 MED ORDER — FUROSEMIDE 10 MG/ML IJ SOLN
40.0000 mg | Freq: Once | INTRAMUSCULAR | Status: AC
  Administered 2015-09-29: 40 mg via INTRAVENOUS
  Filled 2015-09-29: qty 4

## 2015-09-29 MED ORDER — INSULIN ASPART 100 UNIT/ML ~~LOC~~ SOLN: 0.0000 [IU] | Freq: Every day | SUBCUTANEOUS | Status: DC

## 2015-09-29 MED ORDER — DILTIAZEM HCL ER COATED BEADS 120 MG PO CP24
120.0000 mg | ORAL_CAPSULE | Freq: Every day | ORAL | Status: DC
  Administered 2015-09-29 – 2015-09-30 (×2): 120 mg via ORAL
  Filled 2015-09-29 (×2): qty 1

## 2015-09-29 NOTE — Progress Notes (Signed)
Notified by Donn PieriniKristi Webster,  CMRN of [facility]. Chart and patient information currently under review to confirm hospice eligibility.   Spoke with son [name] by phone  to initiate education related to hospice philosophy, services and team approach to care. Family verbalized understanding of the information provided. Per discussion plan is for discharge to home by personal vehicle with son possibly tomorrow.   Please send signed completed DNR form home with patient.   Patient will need prescriptions for discharge comfort medications.   DME needs discussed and family requested hospital bed and OBT.   [facility] equipment manager Jewel Kizzie BaneHughes notified and will contact [facility] to arrange delivery to the home.  The home address has been verified and is correct in the chart; son [name] is family member to be contacted to arrange time of delivery.  [facility] aware of the above.   Completed discharge [facility] at [phone] when final.  Please notify [facility] when patient is ready to leave unit at discharge-call [phone].   [facility] information and contact numbers have been given to [place] [place]. Above information shared with Donn PieriniKristi Webster.CMRN.  Please call with any questions.   Thank you!   Lavone NeriSusan Thomas, RN  [facility] Liaison  [phone]

## 2015-09-29 NOTE — Evaluation (Signed)
Clinical/Bedside Swallow Evaluation Patient Details  Name: Frank Marks MRN: [id number] Date of Birth: Jun 16, 1926  Today's Date: 09/29/2015 Time: SLP Start Time (ACUTE ONLY): 0909 SLP Stop Time (ACUTE ONLY): 0919 SLP Time Calculation (min) (ACUTE ONLY): 10 min  Past Medical History:  Past Medical History:  Diagnosis Date  . Acute on chronic diastolic CHF (congestive heart failure), NYHA class 3 (HCC) 07/29/2014  . Anxiety    short periods of nervousness, reported since need for heart care, he feels anxious on & off.    . Aortic stenosis    a. s/p bioprosthetic AVR 9/16.  b. Echo 11/16: Mild LVH, EF 50-55%, normal wall motion, indeterminate diastolic function, AVR okay with mean gradient 7 mmHg and no regurgitation, borderline dilated aortic root (38 mm), mildly dilated ascending aorta (40 mm), MAC, mild MR, severe LAE, mildly reduced RVSF, mild to moderate RAE, PASP 46 mmHg, left sided pleural effusion  . Arthritis    "back" (07/29/2014)  . Carotid artery stenosis   . Chronic atrial fibrillation (HCC)   . Chronic back pain   . Chronic lower back pain   . COPD (chronic obstructive pulmonary disease) (HCC)   . Dysrhythmia    afib  . Hx of cardiovascular stress test    Lex MV 3/14:  Not gated, apical thinning, no ischemia.   Marland Kitchen. Hx of echocardiogram 2014   Echo 04/24/12: mild LVH, EF 50-55%, mild AS (mean 9), MAC, mild MR, mod-severe BAE, mod TR, PASP 39  . Hypercholesterolemia   . Hypertension   . Osteoarthritis   . Pneumonia ~ 1951; 02/2014  . Stroke (HCC)    ministroke- 2xs  . TIA (transient ischemic attack) 06/2007   "had 2-3 little ones around that time"  . Type II diabetes mellitus (HCC)   . UTI (lower urinary tract infection) 09/2015   Past Surgical History:  Past Surgical History:  Procedure Laterality Date  . AORTIC VALVE REPLACEMENT N/A 10/29/2014   Procedure: AORTIC VALVE REPLACEMENT (AVR);  Surgeon: Alleen BorneBryan K Bartle, MD;  [facility];  Service: Open Heart Surgery;   Laterality: N/A;  . APPENDECTOMY    . BACK SURGERY     x2   . CARDIAC CATHETERIZATION  07/29/2014  . CARDIAC CATHETERIZATION N/A 07/29/2014   Procedure: Left Heart Cath and Coronary Angiography;  Surgeon: Tonny BollmanMichael Cooper, MD;  [facility];  Service: Cardiovascular;  Laterality: N/A;  . CATARACT EXTRACTION W/ INTRAOCULAR LENS  IMPLANT, BILATERAL Bilateral   . CORONARY ARTERY BYPASS GRAFT N/A 10/29/2014   Procedure: CORONARY ARTERY BYPASS GRAFTING (CABG);  Surgeon: Alleen BorneBryan K Bartle, MD;  [facility];  Service: Open Heart Surgery;  Laterality: N/A;  . EXPLORATION POST OPERATIVE OPEN HEART N/A 10/29/2014   Procedure: EXPLORATION POST OPERATIVE OPEN HEART;  Surgeon: Alleen BorneBryan K Bartle, MD;  Location: [facility] OR;  Service: Open Heart Surgery;  Laterality: N/A;  . EYE SURGERY     cataracts - removed- bilateral, /w IOL  . JOINT REPLACEMENT    . KNEE ARTHROSCOPY Left   . LUMBAR LAMINECTOMY/DECOMPRESSION MICRODISCECTOMY  1990's X 1; 2000's X1  . REPLACEMENT TOTAL KNEE Bilateral ~ 2001-2002  . TEE WITHOUT CARDIOVERSION N/A 10/29/2014   Procedure: TRANSESOPHAGEAL ECHOCARDIOGRAM (TEE);  Surgeon: Alleen BorneBryan K Bartle, MD;  [facility];  Service: Open Heart Surgery;  Laterality: N/A;  . TONSILLECTOMY    . TOTAL HIP ARTHROPLASTY Left ~ 200253   HPI:  82 year old gentleman with a past medical history of chronic diastolic congestive heart  failure, atrial fibrillation admitted to the medicine service on 09/28/2015, presented with complaints of shortness of breath. He was found to be in acute hypoxemic respiratory failure having oxygen saturations in the 80s. He was also found to be in A. fib with RVR with ventricular rates to 160s. Imaging revealed pulmonary edema.    Assessment / Plan / Recommendation Clinical Impression   Pt presents with functional oropharyngeal swallow marked by adequate reciprocity of swallowing and breathing.  RR is well under threshold for concerns re: swallowing/breathing  coordination.  Pt is exhaling post-swallow; there are no overt s/s of aspiration.  Mastication is effective (except for tough meats) despite absence of teeth, and pt passed 3-oz water test with no deficits.  Caregiver present and assisting with meal.  Recommend continued current diet with chopped meats, thin liquids.  Doubt dysphagia-related aspiration as etiology of pna.     Aspiration Risk  No limitations    Diet Recommendation Regular consistency with chopped meats; thin liquids  Medication Administration: Whole meds with liquid    Other  Recommendations Oral Care Recommendations: Oral care BID   Follow up Recommendations  None    Frequency and Duration            Prognosis        Swallow Study   General Date of Onset: 09/28/15 HPI: 82 year old gentleman with a past medical history of chronic diastolic congestive heart failure, atrial fibrillation admitted to the medicine service on 09/28/2015, presented with complaints of shortness of breath. He was found to be in acute hypoxemic respiratory failure having oxygen saturations in the 80s. He was also found to be in A. fib with RVR with ventricular rates to 160s. Imaging revealed pulmonary edema.  Type of Study: Bedside Swallow Evaluation Previous Swallow Assessment: no Diet Prior to this Study: Regular;Thin liquids Temperature Spikes Noted: No Respiratory Status: Nasal cannula History of Recent Intubation: No Behavior/Cognition: Alert;Cooperative Oral Cavity Assessment: Within Functional Limits Oral Care Completed by SLP: No Oral Cavity - Dentition: Edentulous Vision: Functional for self-feeding Self-Feeding Abilities: Able to feed self Patient Positioning: Upright in bed Baseline Vocal Quality: Normal Volitional Cough: Strong Volitional Swallow: Able to elicit    Oral/Motor/Sensory Function Overall Oral Motor/Sensory Function: Within functional limits   Ice Chips Ice chips: Not tested   Thin Liquid Thin Liquid: Within  functional limits Presentation: Straw    Nectar Thick Nectar Thick Liquid: Not tested   Honey Thick Honey Thick Liquid: Not tested   Puree Puree: Within functional limits   Solid   GO   Solid: Within functional limits       Abeeha Twist L. Samson Fredericouture, [place] CCC/SLP Pager [phone]  Blenda MountsCouture, Camil Wilhelmsen Laurice 09/29/2015,9:21 AM

## 2015-09-29 NOTE — Progress Notes (Signed)
PROGRESS NOTE    Frank Marks  ZOX:[id number] DOB: 1926-06-28 DOA: 09/28/2015 PCP: Ailene RavelHAMRICK,MAURA L, MD   Brief Narrative:  Frank Marks is an 82 year old gentleman with a past medical history of chronic diastolic congestive heart failure, atrial fibrillation anticoagulated with Pradaxa, insulin-dependent diabetes mellitus, admitted to the medicine service on 09/28/2015, presented with complaints of shortness of breath. His found to be in acute hypoxemic respiratory failure having oxygen saturations in the 80s. He was also found to be in A. fib with RVR with ventricular rates to 160s. Imaging revealed pulmonary edema. He was treated with IV Cardizem and IV Lasix, having subsequent clinical improvement.   Assessment & Plan:   Principal Problem:   Acute respiratory failure with hypoxia (HCC) Active Problems:   Permanent atrial fibrillation (HCC)   Essential hypertension   Diabetes mellitus type 2 in nonobese (HCC)   Acute on chronic diastolic CHF (congestive heart failure), NYHA class 3 (HCC)   CAD in native artery- severe- medical Rx   COPD, moderate (HCC)  1.  Acute hypoxemic respiratory failure -Evidence by patient presented in respiratory distress having an O2 saturation of 80% on the field, with respiratory of 36 -Likely secondary to acute on chronic CHF. CT scan of lungs revealed moderate pulmonary edema -He had been recently hospitalized from 09/19/2015 through 09/21/2015 at which time he was treated with antibiotics for pneumonia. He was discharged on Levaquin therapy. -On 09/29/2015 patient showing clinical improvement, spotting to IV Lasix.  2.  Acute on chronic diastolic congestive heart failure -Transthoracic echocardiogram from 12/21/2014 revealed preserved ejection fraction of 50-55% -Suspect A. fib with RVR may have precipitated acute CHF -On admission was given 40 mg of IV Lasix having urine output of 1.6 L -Continues to have clinical evidence of forearm overload, plan  to give 40 mg of IV Lasix today, continue monitoring ins and outs and outs/daily weights  3.  Atrial fibrillation rapid ventricular response, CHADSVasc score of 6 -He presented with ventricular rates in the 160s, placed on IV Cardizem which has been discontinued due to development of bradycardia overnight -Currently remains rate controlled -He takes Cardizem 240 mg by mouth daily at home along with metoprolol 25 mg twice a day and Pradaxa.  -Will restart Cardizem at 120 g by mouth daily, watch blood pressures closely  4.  Question healthcare associated pneumonia -Frank Marks is recently hospitalized and treated with broad-spectrum IV antimicrobial therapy, discharged on Levaquin for pneumonia. -CT scan of lungs do not show an obvious infiltrate. He has been afebrile overnight although labs showing elevated white count. -Patient seemed to show significant improvement with IV diuresis. Will discontinue broad-spectrum IV antibiotic therapy and monitor. Blood cultures obtained on 09/28/2015 showing no growth to date  5.  History of hypertension. -At home he had been taking lisinopril 20 mg by mouth daily, metoprolol 25 mg by mouth twice a day and Cardizem 240 mg by mouth daily. -Presented with A. fib during this hospitalization started on IV Cardizem. Blood pressures on low normal side, plan to continue holding lisinopril for now. Today will add back Cardizem at 120 mg PO q daily and continue metoprolol 25 mg twice a day.   6.  Insulin-dependent diabetes mellitus -Blood sugars fluctuating between 173 and 235 -Continue Lantus 15 units subcutaneous daily with sliding scale coverage. On carbohydrate modified diet.  7. Left chronic effusion -CT scan of lungs showing moderate left pleural effusion with thickened wall and complexity likely chronic exudative and loculated effusion. -He has seen  Dr.Bartle of cardiothoracic surgery at the office having his last appointment on 07/21/2015, at the time did not  recommend thoracotomy for drainage. Respiratory symptoms felt to be related more to diastolic heart failure.   8.  Goals of care. -I spoke to his son [name] over telephone conversation this morning, expressed concerns for repeated hospitalizations and burden this represented to his father. He would like to discuss medical goals of care further with Palliative Medicine. He would like to discuss further Home Hospice Services.     DVT prophylaxis: Anticoagulated with Pradaxa Code Status: Full Code Family Communication: I spoke with Burton ApleyRandy POA [phone] Disposition Plan: Transfer to tele  Consultants:     Procedures:     Antimicrobials:   Vancomycin stopped on 09/29/2015  Aztreonam stopped on 09/29/2015   Subjective: Frank Marks overall reported feeling better today, breathing easier, asking to go home.   Objective: Vitals:   09/29/15 0600 09/29/15 0615 09/29/15 0630 09/29/15 0645  BP: 124/73     Pulse: 78 77 (!) 103 80  Resp: (!) 30 (!) 22 (!) 23 (!) 31  Temp:      TempSrc:      SpO2: 90% 98% 95% 92%  Weight:      Height:        Intake/Output Summary (Last 24 hours) at 09/29/15 0800 Last data filed at 09/29/15 [id number]  Gross per 24 hour  Intake              783 ml  Output             1600 ml  Net             -817 ml   Filed Weights   09/28/15 0147 09/29/15 0400  Weight: 79.4 kg (175 lb) 84.8 kg (186 lb 15.2 oz)    Examination:  General exam: Appears calm and comfortable, chronically ill-appearing, frail  Respiratory system: He has ongoing by basilar crackles, did not auscultate rales wheezing or rhonchi Cardiovascular system: S1 & S2 heard, RRR. No JVD, murmurs, rubs, gallops or clicks. There is 2+ bilateral pedal edema present Gastrointestinal system: Abdomen is nondistended, soft and nontender. No organomegaly or masses felt. Normal bowel sounds heard. Central nervous system: Alert and oriented. No focal neurological deficits. Extremities: Symmetric 5 x 5  power. Skin: No rashes, lesions or ulcers Psychiatry: He is confused     Data Reviewed: I have personally reviewed following labs and imaging studies  CBC:  Recent Labs Lab 09/22/15 1028 09/28/15 0150 09/29/15 0333  WBC 18.4* 26.7* 16.8*  NEUTROABS  --  24.3*  --   HGB 10.7* 10.1* 8.3*  HCT 33.9* 31.8* 27.4*  MCV 81.7 80.5 82.3  PLT 289 343 300   Basic Metabolic Panel:  Recent Labs Lab 09/22/15 1028 09/28/15 0150 09/28/15 1026 09/29/15 0333  NA 129* 135  --  132*  K 4.6 4.6  --  4.5  CL 95* 101  --  98*  CO2 22 24  --  28  GLUCOSE 203* 190*  --  217*  BUN 40* 35*  --  34*  CREATININE 1.13 1.08  --  1.09  CALCIUM 9.6 9.9  --  9.3  MG  --   --  1.5* 1.9   GFR: Estimated Creatinine Clearance: 53.4 mL/min (by C-G formula based on SCr of 1.09 mg/dL). Liver Function Tests:  Recent Labs Lab 09/28/15 0150  AST 26  ALT 27  ALKPHOS 86  BILITOT 0.7  PROT 7.2  ALBUMIN 2.5*   No results for input(s): LIPASE, AMYLASE in the last 168 hours. No results for input(s): AMMONIA in the last 168 hours. Coagulation Profile: No results for input(s): INR, PROTIME in the last 168 hours. Cardiac Enzymes:  Recent Labs Lab 09/28/15 0150  TROPONINI <0.03   BNP (last 3 results) No results for input(s): PROBNP in the last 8760 hours. HbA1C: No results for input(s): HGBA1C in the last 72 hours. CBG:  Recent Labs Lab 09/22/15 1027 09/28/15 2256  GLUCAP 202* 173*   Lipid Profile: No results for input(s): CHOL, HDL, LDLCALC, TRIG, CHOLHDL, LDLDIRECT in the last 72 hours. Thyroid Function Tests: No results for input(s): TSH, T4TOTAL, FREET4, T3FREE, THYROIDAB in the last 72 hours. Anemia Panel: No results for input(s): VITAMINB12, FOLATE, FERRITIN, TIBC, IRON, RETICCTPCT in the last 72 hours. Sepsis Labs:  Recent Labs Lab 09/28/15 0355 09/28/15 0446 09/28/15 0752  PROCALCITON <0.10  --   --   LATICACIDVEN  --  1.33 1.03    Recent Results (from the past 240  hour(s))  Urine culture     Status: None   Collection Time: 09/20/15  6:28 AM  Result Value Ref Range Status   Specimen Description URINE, RANDOM  Final   Special Requests NONE  Final   Culture NO GROWTH  Final   Report Status 09/21/2015 FINAL  Final  MRSA PCR Screening     Status: Abnormal   Collection Time: 09/28/15  9:42 AM  Result Value Ref Range Status   MRSA by PCR POSITIVE (A) NEGATIVE Final    Comment:        The GeneXpert MRSA Assay (FDA approved for NASAL specimens only), is one component of a comprehensive MRSA colonization surveillance program. It is not intended to diagnose MRSA infection nor to guide or monitor treatment for MRSA infections. RESULT CALLED TO, READ BACK BY AND VERIFIED WITH: Roselie Skinner RN 11:35 09/28/15 ([email or web address])          Radiology Studies: Ct Chest Wo Contrast  Result Date: 09/29/2015 CLINICAL DATA:  82 y/o M; hypoxia without pain or shortness of breath. EXAM: CT CHEST WITHOUT CONTRAST TECHNIQUE: Multidetector CT imaging of the chest was performed following the standard protocol without IV contrast. COMPARISON:  Chest radiograph dated 09/28/2015. Chest CT dated 07/02/2015. Chest ultrasound dated 07/02/2015. FINDINGS: Cardiovascular: Moderate cardiomegaly. Status post coronary artery bypass graft surgery with saphenous and LIMA grafts. Aortic valve and root replacement. Severe coronary artery calcification. Mitral annular calcification. Enlarged main pulmonary artery measuring 3.8 cm indicating pulmonary artery hypertension. Extensive calcific aortic atherosclerosis of the thoracic aorta. No pericardial effusion. Mediastinum/Nodes: There is mediastinal lymphadenopathy in the subcarinal, right paratracheal, left paratracheal, prevascular, and AP windows that has increased in size from the prior CT for example an AP window node measuring 12 mm, previously 10 mm series 2, image 57 and a precarinal node measuring 17 mm, previously 13 mm series 2, image 63.  Lungs/Pleura: There is diffuse interlobular septal thickening, ground-glass opacities, and peribronchial thickening compatible with a moderate pulmonary edema. Right greater than left moderate pleural effusions. Dependent opacities in the lower lobes probably represent atelectasis. Upper Abdomen: Bilateral adrenal gland hypertrophy without discrete mass. Right kidney nonobstructing stone. Musculoskeletal: Healed median sternotomy. No acute osseous abnormality is identified. IMPRESSION: 1. Moderate pulmonary edema. Dependent opacities in the lower lobes probably represent atelectasis, underlying pneumonia must be excluded clinically. 2. Moderate right pleural effusion new from prior CT. 3. Stable moderate left pleural effusion with thickened wall and complexity  on prior ultrasound likely represents a chronic exudative and possibly loculated effusion. 4. Stable moderate cardiomegaly. 5. Enlarged main pulmonary artery indicating pulmonary artery hypertension without interval change. 6. Thoracic aortic atherosclerosis. 7. Enlarged mediastinal lymph nodes are probably due to chronic pulmonary edema and are increased in size in comparison with the prior CT of the chest as is the degree of pulmonary edema. Reactive changes due to underlying infection is also possible. Electronically Signed   By: Mitzi Hansen M.D.   On: 09/29/2015 04:39   Dg Chest Port 1 View  Result Date: 09/28/2015 CLINICAL DATA:  82 y/o M; shortness of breath and tachycardia tonight. EXAM: PORTABLE CHEST 1 VIEW COMPARISON:  Chest radiograph dated 09/22/2015. FINDINGS: There is blunting of the costophrenic sulci and patchy opacities in the right greater than left lungs which are increased in comparison with the prior chest radiograph. No pneumothorax. Calcific aortic atherosclerosis of the arch. Aortic valve replacement. Post median sternotomy with wires and alignment. No acute osseous abnormality is evident. Stable cardiac silhouette  given differences in technique. IMPRESSION: Increased patchy opacities of the lungs, right greater than left, likely represents pulmonary edema. Underlying pneumonia is not excluded. Probable small bilateral pleural effusions. Electronically Signed   By: Mitzi Hansen M.D.   On: 09/28/2015 02:43        Scheduled Meds: . aspirin  81 mg Oral Daily  . aztreonam  2 g Intravenous Q8H  . Chlorhexidine Gluconate Cloth  6 each Topical Q0600  . dabigatran  150 mg Oral BID  . folic acid  1 mg Oral QPM  . furosemide  40 mg Intravenous Once  . insulin glargine  15 Units Subcutaneous Daily  . levothyroxine  50 mcg Oral QPM  . metoprolol tartrate  25 mg Oral BID  . mupirocin ointment  1 application Nasal BID  . pravastatin  80 mg Oral QPM  . vancomycin  750 mg Intravenous Q12H   Continuous Infusions: . diltiazem (CARDIZEM) infusion Stopped (09/28/15 2236)     LOS: 1 day    Time spent: 35 minutes    Jeralyn Bennett, MD [facility] Pager [phone]  If 7PM-7AM, please contact night-coverage [email or web address] Password [email or web address] 09/29/2015, 8:00 AM

## 2015-09-29 NOTE — Care Management Note (Signed)
Case Management Note Donn PieriniKristi Kiahna Banghart RN, BSN Unit 2W-Case Manager [phone]  Patient Details  Name: Esmond PlantsClyde D Wassink MRN: [id number] Date of Birth: 06/09/1926  Subjective/Objective:   Pt admitted with acute resp. Failure-                   Action/Plan: PTA pt lived at home- [facility]- referral received on 8/16 for Home Hospice choice- spoke with Jaci LazierSon Randy at bedside and choice offered for Home Hospice- [place]- per son's choice he would like to use [facility]- [facility]-  Per conversation with son- pt has private pay sitters in the AM/PM son is interested in assistance with shower and shave through Hospice- [facility] will need hospital bed for home - per son he will provide transportation home.   Expected Discharge Date:                  Expected Discharge Plan:  Home w Hospice Care  In-House Referral:     Discharge planning Services  CM Consult  Post Acute Care Choice:  Hospice, Durable Medical Equipment Choice offered to:  Adult Children  DME Arranged:    DME Agency:  [facility]  HH Arranged:  Disease Management HH Agency:  [facility]  Status of Service:  Completed, signed off  If discussed at Long Length of Stay Meetings, dates discussed:    Additional Comments:  SonHarvie Heck- Randy- [phone]- [phone]  Darrold SpanWebster, Zamyiah Tino Hall, RN 09/29/2015, 2:00 PM

## 2015-09-29 NOTE — Progress Notes (Signed)
PT Cancellation Note  Patient Details Name: Frank Marks MRN: [id number] DOB: Jan 29, 1927   Cancelled Treatment:    Reason Eval/Treat Not Completed: PT screened, no needs identified, will sign off Pt has now decided to go home on hospice care. Pt does not need PT services at this time as the goal is comfort. Will sign off for now. Please re consult if anything changes. Thanks.   Frank DivineShauna A Reiner Loewen 09/29/2015, 2:30 PM Mylo RedShauna Leba Tibbitts, PT, DPT [phone]

## 2015-09-29 NOTE — Progress Notes (Signed)
Echocardiogram 2D Echocardiogram has been performed.  Frank Marks 09/29/2015, 4:41 PM

## 2015-09-29 NOTE — Consult Note (Signed)
Consultation Note Date: 09/29/2015   Patient Name: Frank Marks  DOB: 26-Dec-1926  MRN: [id number]  Age / Sex: 82 y.o., male  PCP: Leonides Sake, MD Referring Physician: Kelvin Cellar, MD  Reason for Consultation: Establishing goals of care and Hospice Evaluation  HPI/Patient Profile: 82 y.o. male  with past medical history of chronic diastolic congestive heart failure, aortic stenosis s/p aortic valve replacement, carotid artery stenosis, CABG, atrial fib, COPD, anxiety, chronic back pain, hypertension, stroke, pneumonia, insulin-dependent DM, and UTI admitted on 09/28/2015 with shortness of breath and hypoxic respiratory failure. Patient was in afib RVR up to 160's and cardizem drip was initiated. Chest xray revealed pulmonary edema. Patient given lasix. Patient recently admitted for pneumonia and discharged on PO Levaquin every other day. Son explains that patient has been declining since his heart surgery in 2016 but has been more rapidly declining the past few weeks, requiring 2 ED visits and 2 hospitalizations. Son spoke briefly with patient's PCP regarding hospice.    Clinical Assessment and Goals of Care: Initially spoke with patient in morning, whom was alert and oriented to person and place. He states he lives at home and has sitters that come and check on him. When asked if eating, he states "everything I can get." He has not been getting out of bed much. He denies pain or discomfort.  Met with son, Louie Casa to discuss goals of care. Louie Casa is very knowledgeable about his father's chronic medical conditions and understands that these are progressive and he may be at the end of his life. Louie Casa has been very supportive and made it possible for his father to stay at home with medical equipment and 24 hour sitters. Louie Casa works for [employer]' and very familiar with palliative medicine and hospice  services. His father has a living will and would not want any aggressive measures to keep him alive, such as resuscitation or intubation. Paperwork on file. He spoke of his frustration with multiple ER visits and hospitalizations in the past few weeks and would like his father to be able to stay at home with is symptoms managed with home hospice services. MOST form discussed and completed with Louie Casa. Son wishes for comfort measures to be maintained and for his father to not be transferred to the hospital unless comfort needs cannot be met at home. He does want antibiotics if indicated and IV fluids for a trial period. The son and patient do not want a feeding tube. Son is leaving for [place] on Monday and hoping to have hospice services in place at his father's home before he leaves on the trip.   HCPOA-son Louie Casa)    SUMMARY OF RECOMMENDATIONS   -Consult care management: Son agrees with home hospice services. The patient will need a hospital bed and oxygen.  -DNR per son. Paperwork on file.  -Speciality bed: Sacral pressure ulcer -MOST form: Completed and placed in chart.    Code Status/Advance Care Planning:  DNR   Symptom Management:   Denies symptoms.  Managed per attending.  Palliative Prophylaxis:   Aspiration, Delirium Protocol and Turn Reposition  Additional Recommendations (Limitations, Scope, Preferences):  Avoid Hospitalization and Minimize Medications  Psycho-social/Spiritual:   Desire for further Chaplaincy support:no  Additional Recommendations: Caregiving  Support/Resources and Education on Hospice  Prognosis:   < 3 months-In the setting of failure to thrive from acute on chronic CHF and COPD. Multiple ED visits and hospitalizations in the last two weeks and the desire for no further hospitalization.  Discharge Planning: Home with Hospice      Primary Diagnoses: Present on Admission: . Acute on chronic diastolic CHF (congestive heart failure), NYHA class 3  ([place]) . Acute respiratory failure with hypoxia ([place]) . CAD in native artery- severe- medical Rx . COPD, moderate ([place]) . Essential hypertension . Permanent atrial fibrillation ([place])   I have reviewed the medical record, interviewed the patient and family, and examined the patient. The following aspects are pertinent.  Past Medical History:  Diagnosis Date  . Acute on chronic diastolic CHF (congestive heart failure), NYHA class 3 ([place]) 07/29/2014  . Anxiety    short periods of nervousness, reported since need for heart care, he feels anxious on & off.    . Aortic stenosis    a. s/p bioprosthetic AVR 9/16.  b. Echo 11/16: Mild LVH, EF 50-55%, normal wall motion, indeterminate diastolic function, AVR okay with mean gradient 7 mmHg and no regurgitation, borderline dilated aortic root (38 mm), mildly dilated ascending aorta (40 mm), MAC, mild MR, severe LAE, mildly reduced RVSF, mild to moderate RAE, PASP 46 mmHg, left sided pleural effusion  . Arthritis    "back" (07/29/2014)  . Carotid artery stenosis   . Chronic atrial fibrillation ([place])   . Chronic back pain   . Chronic lower back pain   . COPD (chronic obstructive pulmonary disease) ([place])   . Dysrhythmia    afib  . Hx of cardiovascular stress test    Lex MV 3/14:  Not gated, apical thinning, no ischemia.   Marland Kitchen Hx of echocardiogram 2014   Echo 04/24/12: mild LVH, EF 50-55%, mild AS (mean 9), MAC, mild MR, mod-severe BAE, mod TR, PASP 39  . Hypercholesterolemia   . Hypertension   . Osteoarthritis   . Pneumonia ~ 1951; 02/2014  . Stroke ([place])    ministroke- 2xs  . TIA (transient ischemic attack) 06/2007   "had 2-3 little ones around that time"  . Type II diabetes mellitus ([place])   . UTI (lower urinary tract infection) 09/2015   Social History   Social History  . Marital status: Divorced    Spouse name: N/A  . Number of children: N/A  . Years of education: N/A   Occupational History  . reired     from work in Event organiser    Social History Main Topics  . Smoking status: Former Smoker    Packs/day: 1.00    Years: 5.00    Types: Cigarettes    Quit date: 08/20/1984  . Smokeless tobacco: Former Systems developer    Types: Chew     Comment: "stopped smoking cigarettes in the 1970's; quit chewing ~ 2000"  . Alcohol use No  . Drug use: No  . Sexual activity: No   Other Topics Concern  . None   Social History Narrative  . None   Family History  Problem Relation Age of Onset  . Stroke Father   . Healthy Mother   . Arthritis Mother   . Alzheimer's disease Mother   .  Other      no family hx of coronary artery disease   Scheduled Meds: . aspirin  81 mg Oral Daily  . Chlorhexidine Gluconate Cloth  6 each Topical Q0600  . dabigatran  150 mg Oral BID  . diltiazem  120 mg Oral Daily  . folic acid  1 mg Oral QPM  . insulin aspart  0-15 Units Subcutaneous TID WC  . insulin aspart  0-5 Units Subcutaneous QHS  . insulin glargine  15 Units Subcutaneous Daily  . levothyroxine  50 mcg Oral QPM  . metoprolol tartrate  25 mg Oral BID  . mupirocin ointment  1 application Nasal BID  . pravastatin  80 mg Oral QPM   Continuous Infusions:  PRN Meds:.acetaminophen **OR** acetaminophen, albuterol Medications Prior to Admission:  Prior to Admission medications   Medication Sig Start Date End Date Taking? Authorizing Provider  acetaminophen (TYLENOL) 325 MG tablet Take 2 tablets (650 mg total) by mouth every 6 (six) hours as needed for mild pain (or Fever >/= 101). 09/21/15  Yes Robbie Lis, MD  aspirin 81 MG tablet Take 81 mg by mouth every morning.    Yes Historical Provider, MD  dabigatran (PRADAXA) 150 MG CAPS capsule Take 1 capsule (150 mg total) by mouth 2 (two) times daily. 02/01/15  Yes Sherren Mocha, MD  diltiazem (CARDIZEM CD) 240 MG 24 hr capsule TAKE 1 CAPSULE (240 MG TOTAL) BY MOUTH DAILY. Patient taking differently: TAKE 1 CAPSULE (240 MG TOTAL) BY MOUTH every morning 03/29/15  Yes Sherren Mocha, MD  docusate  sodium (COLACE) 100 MG capsule Take 1 capsule (100 mg total) by mouth every 12 (twelve) hours. 03/27/15  Yes [name], DO  ferrous sulfate 325 (65 FE) MG tablet Take 1 tablet (325 mg total) by mouth daily with breakfast. For one month Patient taking differently: Take 325 mg by mouth 2 (two) times daily with a meal. For one month 11/10/14  Yes Donielle Liston Alba, PA-C  folic acid (FOLVITE) 1 MG tablet Take 1 mg by mouth every evening.    Yes Historical Provider, MD  glimepiride (AMARYL) 2 MG tablet Take 2 mg by mouth daily with breakfast.   Yes Historical Provider, MD  Insulin Glargine (LANTUS SOLOSTAR) 100 UNIT/ML Solostar Pen Inject 15 Units into the skin daily.    Yes Historical Provider, MD  levothyroxine (SYNTHROID, LEVOTHROID) 50 MCG tablet Take 50 mcg by mouth every evening.  07/27/15  Yes Historical Provider, MD  lisinopril (PRINIVIL,ZESTRIL) 20 MG tablet Take 20 mg by mouth every morning.  01/04/15  Yes Historical Provider, MD  metFORMIN (GLUCOPHAGE) 500 MG tablet Take 500 mg by mouth 2 (two) times daily with a meal.   Yes Historical Provider, MD  metoprolol tartrate (LOPRESSOR) 25 MG tablet TAKE 1 TABLET (25 MG TOTAL) BY MOUTH 2 (TWO) TIMES DAILY. 08/25/15  Yes [name], PA-C  NITROSTAT 0.4 MG SL tablet Place 0.4 mg under the tongue every 5 (five) minutes as needed for chest pain. Limit 3 tabs per day 09/04/14  Yes Historical Provider, MD  pravastatin (PRAVACHOL) 80 MG tablet Take 80 mg by mouth every evening.    Yes Historical Provider, MD   Allergies  Allergen Reactions  . Cefuroxime Swelling    Tongue swelling   Review of Systems  Physical Exam  Constitutional: He is cooperative.  Cardiovascular: An irregularly irregular rhythm present.  Afib on telemetry  Pulmonary/Chest: Accessory muscle usage present. Tachypnea noted. He has decreased breath sounds.  Abdominal: Soft. Bowel sounds are normal. There is no tenderness.  Neurological: He is alert. GCS eye subscore is 4.  GCS verbal subscore is 5. GCS motor subscore is 6.  Oriented to person and place.   Skin: Skin is warm and dry.  Nursing note and vitals reviewed.   Vital Signs: BP 121/75 (BP Location: Left Arm)   Pulse 75   Temp 97.2 F (36.2 C) (Oral)   Resp 17   Ht 6' (1.829 m)   Wt 84.8 kg (186 lb 15.2 oz)   SpO2 94%   BMI 25.35 kg/m  Pain Assessment: No/denies pain   Pain Score: 0-No pain   SpO2: SpO2: 94 % O2 Device:SpO2: 94 % O2 Flow Rate: .O2 Flow Rate (L/min): 3 L/min  IO: Intake/output summary:   Intake/Output Summary (Last 24 hours) at 09/29/15 1634 Last data filed at 09/29/15 1357  Gross per 24 hour  Intake             1113 ml  Output             1900 ml  Net             -787 ml    LBM: Last BM Date: 09/26/15 Baseline Weight: Weight: 79.4 kg (175 lb) Most recent weight: Weight: 84.8 kg (186 lb 15.2 oz)     Palliative Assessment/Data: PPS 30%   Flowsheet Rows   Flowsheet Row Most Recent Value  Intake Tab  Referral Department  Hospitalist  Unit at Time of Referral  Intermediate Care Unit  Palliative Care Primary Diagnosis  Pulmonary  Date Notified  09/29/15  Palliative Care Type  New Palliative care  Reason for referral  Counsel Regarding Hospice  Date of Admission  09/28/15  Date first seen by Palliative Care  09/29/15  # of days Palliative referral response time  0 Day(s)  # of days IP prior to Palliative referral  1  Clinical Assessment  Palliative Performance Scale Score  30%  Psychosocial & Spiritual Assessment  Palliative Care Outcomes  Patient/Family meeting held?  Yes  Palliative Care Outcomes  Clarified goals of care, Counseled regarding hospice, Changed CPR status, Provided psychosocial or spiritual support, Provided advance care planning  Patient/Family wishes: Interventions discontinued/not started   Mechanical Ventilation  Palliative Care follow-up planned  Yes, Facility      Time In/Out: 9355-2174, 1200-1300 Time Total: 81mn Greater than  50%  of this time was spent counseling and coordinating care related to the above assessment and plan.  Signed by: MBasilio Cairo NP MImogene Burn PA-C Palliative Medicine Pager: [phone]   Please contact Palliative Medicine Team phone at [phone] questions and concerns.  For individual provider: See AShea Evans

## 2015-09-30 DIAGNOSIS — Z515 Encounter for palliative care: Secondary | ICD-10-CM

## 2015-09-30 LAB — BASIC METABOLIC PANEL
Anion gap: 10 (ref 5–15)
BUN: 31 mg/dL — AB (ref 6–20)
CALCIUM: 9.3 mg/dL (ref 8.9–10.3)
CO2: 26 mmol/L (ref 22–32)
CREATININE: 0.97 mg/dL (ref 0.61–1.24)
Chloride: 95 mmol/L — ABNORMAL LOW (ref 101–111)
GFR calc Af Amer: >60 mL/min (ref >60)
GFR calc non Af Amer: >60 mL/min (ref >60)
GLUCOSE: 91 mg/dL (ref 65–99)
Potassium: 4.2 mmol/L (ref 3.5–5.1)
Sodium: 131 mmol/L — ABNORMAL LOW (ref 135–145)

## 2015-09-30 LAB — GLUCOSE, CAPILLARY
GLUCOSE-CAPILLARY: 89 mg/dL (ref 65–99)
Glucose-Capillary: 267 mg/dL — ABNORMAL HIGH (ref 65–99)

## 2015-09-30 LAB — CBC
HCT: 28.6 % — ABNORMAL LOW (ref 39.0–52.0)
HEMOGLOBIN: 8.9 g/dL — AB (ref 13.0–17.0)
MCH: 25.1 pg — AB (ref 26.0–34.0)
MCHC: 31.1 g/dL (ref 30.0–36.0)
MCV: 80.6 fL (ref 78.0–100.0)
PLATELETS: 310 10*3/uL (ref 150–400)
RBC: 3.55 MIL/uL — ABNORMAL LOW (ref 4.22–5.81)
RDW: 16.3 % — AB (ref 11.5–15.5)
WBC: 20.1 10*3/uL — ABNORMAL HIGH (ref 4.0–10.5)

## 2015-09-30 LAB — PROCALCITONIN: PROCALCITONIN: 0.32 ng/mL

## 2015-09-30 MED ORDER — GUAIFENESIN ER 600 MG PO TB12
1200.0000 mg | ORAL_TABLET | Freq: Two times a day (BID) | ORAL | Status: DC
  Administered 2015-09-30: 1200 mg via ORAL
  Filled 2015-09-30: qty 2

## 2015-09-30 MED ORDER — LEVOFLOXACIN 500 MG PO TABS: 500.0000 mg | ORAL_TABLET | Freq: Every day | ORAL | 0 refills | Status: AC

## 2015-09-30 MED ORDER — BENZONATATE 100 MG PO CAPS: 100.0000 mg | ORAL_CAPSULE | Freq: Three times a day (TID) | ORAL | 0 refills | Status: AC | PRN

## 2015-09-30 MED ORDER — LORAZEPAM 0.5 MG PO TABS: 0.5000 mg | ORAL_TABLET | Freq: Two times a day (BID) | ORAL | 0 refills | Status: AC | PRN

## 2015-09-30 MED ORDER — OXYCODONE HCL 5 MG PO TABS: 5.0000 mg | ORAL_TABLET | ORAL | 0 refills | Status: AC | PRN

## 2015-09-30 MED ORDER — BENZONATATE 100 MG PO CAPS: 100.0000 mg | ORAL_CAPSULE | Freq: Three times a day (TID) | ORAL | Status: DC | PRN

## 2015-09-30 MED ORDER — FUROSEMIDE 20 MG PO TABS: 20.0000 mg | ORAL_TABLET | Freq: Every day | ORAL | 1 refills | Status: AC

## 2015-09-30 MED ORDER — GUAIFENESIN ER 600 MG PO TB12: 1200.0000 mg | ORAL_TABLET | Freq: Two times a day (BID) | ORAL | 1 refills | Status: AC

## 2015-09-30 NOTE — Progress Notes (Signed)
MC- 2W - 08   [facility] RN note  ° °Follow up visit to room .  Met with patient's son Eric and DIL. Reviewed hospice services with them and answered questions.   They confirmed patient is being discharged home today via [employer].   Eric is going home to pick up portable oxygen tanks they have at home for transfer.  I did also contact the [facility] rep to see if 02 tanks can also be delivered to patient's room.  Per Eric [facility] is scheduled to deliver hospital bed around 11 am today.   ° °Contact numbers for [facility] given to both Eric and his wife as they stated that they live with him.   ° °Susan Thomas, RN  °[facility] hospital liaison  °[phone]  °

## 2015-09-30 NOTE — Consult Note (Signed)
   [facility]   09/30/2015  [name] 1926-05-06 [id number]     Patient [facility] services. Chart reviewed. Noted discharge plan is for home with hospice services at this time.  There are no identifiable [facility] Care [facility] needs at this time.  Raiford NobleAtika Hall, MSN-Ed, RN,BSN [facility] Care Management Hospital Liaison [phone]

## 2015-09-30 NOTE — Progress Notes (Signed)
Daily Progress Note   Patient Name: Frank Marks       Date: 09/30/2015 DOB: Aug 06, 1926  Age: 82 y.o. MRN#: [id number] Attending Physician: Kelvin Cellar, MD Primary Care Physician: Leonides Sake, MD Admit Date: 09/28/2015  Reason for Consultation/Follow-up: Hospice Evaluation  Subjective: Upon arrival to room, patient awake, alert, and oriented. He states he did not sleep well last night and RN at bedside agrees that he was very restless. Patient states he is ready to go home. Denies pain but is uncomfortable in the bed. He was able to eat breakfast this am.   Met with son, Louie Casa, yesterday and care management has been consulted for home hospice services. The family has chosen [facility] and have been in communication with them. Equipment will be delivered today and patient should be discharged.   Length of Stay: 2  Current Medications: Scheduled Meds:  . aspirin  81 mg Oral Daily  . Chlorhexidine Gluconate Cloth  6 each Topical Q0600  . dabigatran  150 mg Oral BID  . diltiazem  120 mg Oral Daily  . folic acid  1 mg Oral QPM  . guaiFENesin  1,200 mg Oral BID  . insulin aspart  0-15 Units Subcutaneous TID WC  . insulin aspart  0-5 Units Subcutaneous QHS  . insulin glargine  15 Units Subcutaneous Daily  . levothyroxine  50 mcg Oral QPM  . metoprolol tartrate  25 mg Oral BID  . mupirocin ointment  1 application Nasal BID  . pravastatin  80 mg Oral QPM    Continuous Infusions:    PRN Meds: acetaminophen **OR** acetaminophen, albuterol, benzonatate  Physical Exam  Constitutional: He is oriented to person, place, and time. He is cooperative.  Cardiovascular: An irregularly irregular rhythm present.  Pulmonary/Chest: Accessory muscle usage  present. He has decreased breath sounds.  Dyspnea at rest  Abdominal: Soft. Bowel sounds are normal. There is no tenderness.  Neurological: He is alert and oriented to person, place, and time. GCS eye subscore is 4. GCS verbal subscore is 5. GCS motor subscore is 6.  Skin: Skin is warm and dry. There is pallor.  Psychiatric: He has a normal mood and affect. His speech is normal.  Nursing note and vitals reviewed.  Vital Signs: BP (!) 163/95 (BP Location: Right Arm)   Pulse 77   Temp 98.2 F (36.8 C) (Oral)   Resp 20   Ht 6' (1.829 m)   Wt 87.9 kg (193 lb 12.8 oz)   SpO2 95%   BMI 26.28 kg/m  SpO2: SpO2: 95 % O2 Device: O2 Device: Nasal Cannula O2 Flow Rate: O2 Flow Rate (L/min): 3 L/min  Intake/output summary:  Intake/Output Summary (Last 24 hours) at 09/30/15 1113 Last data filed at 09/30/15 0900  Gross per 24 hour  Intake              220 ml  Output              600 ml  Net             -380 ml   LBM: Last BM Date: 09/26/15 Baseline Weight: Weight: 79.4 kg (175 lb) Most recent weight: Weight: 87.9 kg (193 lb 12.8 oz)       Palliative Assessment/Data: PPS 30%   Flowsheet Rows   Flowsheet Row Most Recent Value  Intake Tab  Referral Department  Hospitalist  Unit at Time of Referral  Intermediate Care Unit  Palliative Care Primary Diagnosis  Pulmonary  Date Notified  09/29/15  Palliative Care Type  New Palliative care  Reason for referral  Counsel Regarding Hospice  Date of Admission  09/28/15  Date first seen by Palliative Care  09/29/15  # of days Palliative referral response time  0 Day(s)  # of days IP prior to Palliative referral  1  Clinical Assessment  Palliative Performance Scale Score  30%  Psychosocial & Spiritual Assessment  Palliative Care Outcomes  Patient/Family meeting held?  Yes  Palliative Care Outcomes  Clarified goals of care, Counseled regarding hospice, Changed CPR status, Provided psychosocial or spiritual support, Provided advance  care planning  Patient/Family wishes: Interventions discontinued/not started   Mechanical Ventilation  Palliative Care follow-up planned  Yes, Facility      Patient Active Problem List   Diagnosis Date Noted  . Palliative care encounter   . Goals of care, counseling/discussion   . Encounter for hospice care discussion   . DNR (do not resuscitate)   . Acute respiratory failure with hypoxia ([place]) 09/19/2015  . Pleural effusion 06/28/2015  . Aortic valvar stenosis 12/16/2014  . Aortic valve prolapse 12/16/2014  . Aortic valve replaced 12/16/2014  . S/P CABG (coronary artery bypass graft) 10/29/2014  . Pulmonary nodules 09/22/2014  . Pulmonary hypertension-PA 50 mmHg 07/30/2014  . CAD in native artery- severe- medical Rx 07/30/2014  . Chronic anticoagulation-Pradaxa 07/30/2014  . COPD, moderate ([place]) 07/30/2014  . Acute on chronic diastolic CHF (congestive heart failure), NYHA class 3 ([place]) 07/29/2014  . Essential hypertension 02/13/2014  . UTI (urinary tract infection) 02/13/2014  . Diabetes mellitus type 2 in nonobese ([place]) 02/13/2014  . Occlusion and stenosis of carotid artery with cerebral infarction 06/15/2010  . Permanent atrial fibrillation ([place]) 04/13/2010    Palliative Care Assessment & Plan   Patient Profile: 82 yo male  with past medical history of chronic diastolic congestive heart failure, aortic stenosis s/p aortic valve replacement, carotid artery stenosis, CABG, atrial fib, COPD, anxiety, chronic back pain, hypertension, stroke, pneumonia, insulin-dependent DM, and UTI admitted on 09/28/2015 with shortness of breath and hypoxic respiratory failure. Patient was in afib RVR up to 160's and cardizem drip was initiated. Chest xray revealed pulmonary edema. Patient given lasix. Patient recently admitted for pneumonia and discharged on  PO Levaquin every other day. Son explains that patient has been declining since his heart surgery in 2016 but has been more rapidly declining  the past few weeks, requiring 2 ED visits and 2 hospitalizations. Son spoke briefly with patient's PCP regarding hospice. Palliative meeting on 8/16. Son and patient agree with home hospice services.   Assessment: Patient alert and oriented, lying in the bed. Dyspnea at rest.   Recommendations/Plan:  [facility] following for home hospice services. Should be discharged today.   Dyspnea at rest: Oxycodone '5mg'$  PO q4h prn dyspnea and moderate pain.   Restlessness/insomnia: Lorazepam 0.'5mg'$  PO TID prn anxiety or sleep.   Goals of Care and Additional Recommendations:  Limitations on Scope of Treatment: Avoid Hospitalization and Minimize Medications  Code Status: DNR   Code Status Orders        Start     Ordered   09/29/15 1301  Do not attempt resuscitation (DNR)  Continuous    Question Answer Comment  In the event of cardiac or respiratory ARREST Do not call a "code blue"   In the event of cardiac or respiratory ARREST Do not perform Intubation, CPR, defibrillation or ACLS   In the event of cardiac or respiratory ARREST Use medication by any route, position, wound care, and other measures to relive pain and suffering. May use oxygen, suction and manual treatment of airway obstruction as needed for comfort.      09/29/15 1301    Code Status History    Date Active Date Inactive Code Status Order ID Comments User Context   09/28/2015  9:38 AM 09/29/2015  1:01 PM Full Code [id number]  Edwin Dada, MD Inpatient   09/19/2015  6:23 AM 09/21/2015  5:01 PM Full Code [id number]  Edwin Dada, MD Inpatient   11/25/2014  2:14 PM 11/26/2014  2:40 PM Full Code [id number]  Brett Canales, PA-C ED   10/29/2014  2:31 PM 11/10/2014  5:16 PM Full Code [id number]  Gaye Pollack, MD Inpatient   07/29/2014  4:06 PM 07/29/2014  4:06 PM Full Code [id number]  Sherren Mocha, MD Inpatient   07/29/2014  4:06 PM 07/30/2014  2:12 PM Full Code [id number]  Sherren Mocha, MD  Inpatient   02/13/2014  3:13 AM 02/16/2014  3:37 PM Full Code [id number]  Berle Mull, MD Inpatient   02/13/2014  2:59 AM 02/13/2014  3:13 AM DNR [id number]  Berle Mull, MD Inpatient    Advance Directive Documentation   Flowsheet Row Most Recent Value  Type of Advance Directive  Healthcare Power of Attorney, Living will  Pre-existing out of facility DNR order (yellow form or pink MOST form)  No data  "MOST" Form in Place?  No data       Prognosis:   < 3 months-In the setting of failure to thrive from acute on chronic CHF and COPD. Multiple ED visits and hospitalizations in the last two weeks. Patient and son desire no further hospitalization.   Discharge Planning:  [facility] [employer] [facility]   Care plan was discussed with patient  Thank you for allowing the Palliative Medicine Team to assist in the care of this patient.   Time In: 1000 Time Out: 1025 Total Time 5mn Prolonged Time Billed  no       Greater than 50%  of this time was spent counseling and coordinating care related to the above assessment and plan.  MBasilio Cairo NP  Please contact  Palliative Medicine Team phone at [phone] for questions and concerns.      

## 2015-09-30 NOTE — Care Management Note (Signed)
Case Management Note Donn PieriniKristi Zamora Colton RN, BSN Unit 2W-Case Manager [phone]  Patient Details  Name: Frank Marks MRN: [id number] Date of Birth: Sep 04, 1926  Subjective/Objective:   Pt admitted with acute resp. Failure-                   Action/Plan: PTA pt lived at home- [facility]- referral received on 8/16 for Home Hospice choice- spoke with Jaci LazierSon Randy at bedside and choice offered for Home Hospice- [place]- per son's choice he would like to use [facility]- [facility]-  Per conversation with son- pt has private pay sitters in the AM/PM son is interested in assistance with shower and shave through Hospice- [facility]- will need hospital bed for home - per son he will provide transportation home.   Expected Discharge Date:     09/30/15             Expected Discharge Plan:  Home w Hospice Care  In-House Referral:     Discharge planning Services  CM Consult  Post Acute Care Choice:  Hospice, Durable Medical Equipment Choice offered to:  Adult Children  DME Arranged:    DME Agency:  [facility]  HH Arranged:  Disease Management HH Agency:  [facility]  Status of Service:  Completed, signed off  If discussed at Long Length of Stay Meetings, dates discussed:    Additional Comments:  SonHarvie Heck- Randy- [phone]- [phone]  09/30/15- 1000- Donn PieriniKristi Mirabella Hilario RN, BSN- pt for d/c home today- [facility] following for home hospice- spoke with Fannie KneeSue with [facility] who is aware of discharge for today and has already spoken with family this AM- family to bring portable tanks from home for transport- [facility] Fannie KneeSue.   Darrold SpanWebster, Shion Bluestein Hall, RN 09/30/2015, 10:15 AM

## 2015-09-30 NOTE — Discharge Summary (Addendum)
Physician Discharge Summary  Frank Marks ZOX:[id number] DOB: 31-Mar-1926 DOA: 09/28/2015  PCP: Ailene Ravel, MD  Admit date: 09/28/2015 Discharge date: 09/30/2015  Time spent: 35 minutes  Recommendations for Outpatient Follow-up:  1. Frank Marks was discharged to his home with [facility].  2. MOST form completed during this hospitalization, he is a DNR, agreeable to antibiotic therapy if indicated.    Discharge Diagnoses:  Principal Problem:   Acute respiratory failure with hypoxia (HCC) Active Problems:   Permanent atrial fibrillation (HCC)   Essential hypertension   Diabetes mellitus type 2 in nonobese (HCC)   Acute on chronic diastolic CHF (congestive heart failure), NYHA class 3 (HCC)   CAD in native artery- severe- medical Rx   COPD, moderate (HCC)   Palliative care encounter   Goals of care, counseling/discussion   Encounter for hospice care discussion   DNR (do not resuscitate)   Discharge Condition: Stable  Diet recommendation: As tolerated  Filed Weights   09/28/15 0147 09/29/15 0400 09/30/15 0545  Weight: 79.4 kg (175 lb) 84.8 kg (186 lb 15.2 oz) 87.9 kg (193 lb 12.8 oz)    History of present illness:  Frank Marks is a 82 y.o. male with a past medical history significant for COPD, chronic diastolic CHF, recent UTI, Afib on Pradaxa, hypothyroidism, and IDDMwho presents with acute respiratory difficulty and hypoxia.  All history collected from chart review and from family at the bedside, as patient is sluggish and unable to provide meaningful history for himself.  The patient was admitted for 2 days last week for acute on chronic hypoxic respiratory failure, ultimately thought to be due to COPD flare, possible pneumonia.  A blood culture was positive for suspected contaminant and he was discharged on Levaquin every other day, which he just completed.  Despite this, since discharge, family note he has had a steady decline.  Was in the ER for  respiratory distress the day after discharge, but appeared benign and was discharged from the ED.  HH PT came to the house, but he could barely walk a few feet before getting too tired.  Family note that over the last few days he has appeared more "dazed", weak and "out of it".  They saw his PCP yesterday who recommended Hospice evaluation, diagnosis presumably FTT in the setting of COPD and diastolic CHF.  Hospital Course:  Frank Marks is an 82 year old gentleman with a past medical history of chronic diastolic congestive heart failure, atrial fibrillation anticoagulated with Pradaxa, insulin-dependent diabetes mellitus, admitted to the medicine service on 09/28/2015, presented with complaints of shortness of breath. His found to be in acute hypoxemic respiratory failure having oxygen saturations in the 80s. He was also found to be in A. fib with RVR with ventricular rates to 160s. Imaging revealed pulmonary edema. He was treated with IV Cardizem and IV Lasix, having subsequent clinical improvement.  1.  Acute hypoxemic respiratory failure -Evidence by patient presented in respiratory distress having an O2 saturation of 80% on the field, with respiratory of 36 -Likely secondary to acute on chronic CHF. CT scan of lungs revealed moderate pulmonary edema -He had been recently hospitalized from 09/19/2015 through 09/21/2015 at which time he was treated with antibiotics for pneumonia. He was discharged on Levaquin therapy. -He improved during this hospitalization with IV lasix -Will discharge on home oxygen   2.  Acute on chronic diastolic congestive heart failure -Transthoracic echocardiogram from 12/21/2014 revealed preserved ejection fraction of 50-55% -Suspect A. fib with RVR  may have precipitated acute CHF -On admission was given 40 mg of IV Lasix having urine output of 1.6 L -On 09/29/2015: Continues to have clinical evidence of forearm overload, plan to give 40 mg of IV Lasix today, continue  monitoring ins and outs and outs/daily weights -On 09/30/2015 showing clinical improvement. Will discharge on Lasix 20 mg PO q daily. Please follow up on volume status.   3.  Atrial fibrillation rapid ventricular response, CHADSVasc score of 6 -09/29/2015: He presented with ventricular rates in the 160s, placed on IV Cardizem which has been discontinued due to development of bradycardia overnight -Currently remains rate controlled -He takes Cardizem 240 mg by mouth daily at home along with metoprolol 25 mg twice a day and Pradaxa.  -He was dsicharged on Cardizem at 240 mg by mouth daily and metoprolol   4.  Question healthcare associated pneumonia -Frank Marks is recently hospitalized and treated with broad-spectrum IV antimicrobial therapy, discharged on Levaquin for pneumonia. -CT scan of lungs do not show an obvious infiltrate. He has been afebrile overnight although labs showing elevated white count. -09/29/2015: Patient seemed to show significant improvement with IV diuresis. Will discontinue broad-spectrum IV antibiotic therapy and monitor. Blood cultures obtained on 09/28/2015 showing no growth to date -He was discharged on 5 more days of oral Levaquin   5.  History of hypertension. -At home he had been taking lisinopril 20 mg by mouth daily, metoprolol 25 mg by mouth twice a day and Cardizem 240 mg by mouth daily. -Presented with A. fib during this hospitalization started on IV Cardizem. Blood pressures on low normal side, plan to continue holding lisinopril for now. Today will add back Cardizem at 120 mg PO q daily and continue metoprolol 25 mg twice a day.   6.  Insulin-dependent diabetes mellitus -Blood sugars fluctuating between 173 and 235 -Continue Lantus 15 units subcutaneous daily with sliding scale coverage. On carbohydrate modified diet.  7. Left chronic effusion -CT scan of lungs showing moderate left pleural effusion with thickened wall and complexity likely chronic  exudative and loculated effusion. -He has seen Dr.Bartle of cardiothoracic surgery at the office having his last appointment on 07/21/2015, at the time did not recommend thoracotomy for drainage. Respiratory symptoms felt to be related more to diastolic heart failure.   8.  Goals of care. -I spoke to his son [name] over telephone conversation this morning, expressed concerns for repeated hospitalizations and burden this represented to his father. He would like to discuss medical goals of care further with Palliative Medicine. He would like to discuss further Home Hospice Services -During this hospitalization he was seen by Palliative Medicine. He will be discharged home with Home [facility]osp[facility] providing care.   Consultations:  Palliative Medicine  Discharge Exam: Vitals:   09/29/15 1950 09/30/15 0545  BP: (!) 146/75 (!) 163/95  Pulse: (!) 109 77  Resp: 20 20  Temp: 97.9 F (36.6 C) 98.2 F (36.8 C)    General: Awake and alert, wants to go home Cardiovascular: Irregular rate and rhythm, NL S1S2, interim improvement to lower extremity edema Respiratory: Improved b/l crackles.  Abdomen: Benign, soft nontendeer nondistended Extremities: Improved lower extremity edema.   Discharge Instructions   Discharge Instructions    (HEART FAILURE PATIENTS) Call MD:  Anytime you have any of the following symptoms: 1) 3 pound weight gain in 24 hours or 5 pounds in 1 week 2) shortness of breath, with or without a dry hacking cough 3) swelling in the hands,  feet or stomach 4) if you have to sleep on extra pillows at night in order to breathe.    Complete by:  As directed   Call MD for:    Complete by:  As directed   Call MD for:  difficulty breathing, headache or visual disturbances    Complete by:  As directed   Call MD for:  extreme fatigue    Complete by:  As directed   Call MD for:  hives    Complete by:  As directed   Call MD for:  persistant dizziness or light-headedness    Complete by:  As  directed   Call MD for:  persistant nausea and vomiting    Complete by:  As directed   Call MD for:  redness, tenderness, or signs of infection (pain, swelling, redness, odor or green/yellow discharge around incision site)    Complete by:  As directed   Call MD for:  severe uncontrolled pain    Complete by:  As directed   Call MD for:  temperature >100.4    Complete by:  As directed   Diet - low sodium heart healthy    Complete by:  As directed   Increase activity slowly    Complete by:  As directed     Current Discharge Medication List    START taking these medications   Details  benzonatate (TESSALON) 100 MG capsule Take 1 capsule (100 mg total) by mouth 3 (three) times daily as needed for cough. Qty: 20 capsule, Refills: 0    furosemide (LASIX) 20 MG tablet Take 1 tablet (20 mg total) by mouth daily. Qty: 30 tablet, Refills: 1    guaiFENesin (MUCINEX) 600 MG 12 hr tablet Take 2 tablets (1,200 mg total) by mouth 2 (two) times daily. Qty: 30 tablet, Refills: 1    levofloxacin (LEVAQUIN) 500 MG tablet Take 1 tablet (500 mg total) by mouth daily. Qty: 5 tablet, Refills: 0    LORazepam (ATIVAN) 0.5 MG tablet Take 1 tablet (0.5 mg total) by mouth 2 (two) times daily as needed for anxiety or sleep. Qty: 10 tablet, Refills: 0    oxyCODONE (ROXICODONE) 5 MG immediate release tablet Take 1 tablet (5 mg total) by mouth every 4 (four) hours as needed for moderate pain (and Dyspnea). Qty: 20 tablet, Refills: 0      CONTINUE these medications which have NOT CHANGED   Details  acetaminophen (TYLENOL) 325 MG tablet Take 2 tablets (650 mg total) by mouth every 6 (six) hours as needed for mild pain (or Fever >/= 101). Qty: 30 tablet, Refills: 0    aspirin 81 MG tablet Take 81 mg by mouth every morning.     dabigatran (PRADAXA) 150 MG CAPS capsule Take 1 capsule (150 mg total) by mouth 2 (two) times daily. Qty: 60 capsule, Refills: 3    diltiazem (CARDIZEM CD) 240 MG 24 hr capsule TAKE 1  CAPSULE (240 MG TOTAL) BY MOUTH DAILY. Qty: 30 capsule, Refills: 11    docusate sodium (COLACE) 100 MG capsule Take 1 capsule (100 mg total) by mouth every 12 (twelve) hours. Qty: 60 capsule, Refills: 0    ferrous sulfate 325 (65 FE) MG tablet Take 1 tablet (325 mg total) by mouth daily with breakfast. For one month Qty: 30 tablet, Refills: 1    folic acid (FOLVITE) 1 MG tablet Take 1 mg by mouth every evening.     glimepiride (AMARYL) 2 MG tablet Take 2 mg by mouth daily  with breakfast.    Insulin Glargine (LANTUS SOLOSTAR) 100 UNIT/ML Solostar Pen Inject 15 Units into the skin daily.     levothyroxine (SYNTHROID, LEVOTHROID) 50 MCG tablet Take 50 mcg by mouth every evening.     metoprolol tartrate (LOPRESSOR) 25 MG tablet TAKE 1 TABLET (25 MG TOTAL) BY MOUTH 2 (TWO) TIMES DAILY. Qty: 60 tablet, Refills: 11    NITROSTAT 0.4 MG SL tablet Place 0.4 mg under the tongue every 5 (five) minutes as needed for chest pain. Limit 3 tabs per day Refills: 2    pravastatin (PRAVACHOL) 80 MG tablet Take 80 mg by mouth every evening.       STOP taking these medications     lisinopril (PRINIVIL,ZESTRIL) 20 MG tablet      metFORMIN (GLUCOPHAGE) 500 MG tablet        Allergies  Allergen Reactions  . Cefuroxime Swelling    Tongue swelling   Follow-up Information    Hospice at [facility] .   Specialty:  Hospice and Palliative Medicine Why:  Home Hospice as been arranage- equipment will come through [facility] arranged by [facility] Contact information: [address] [place] [address] [phone]        HAMRICK,MAURA L, MD Follow up in 2 week(s).   Specialty:  Family Medicine Contact information: [address] [place] [address] [phone]            The results of significant diagnostics from this hospitalization (including imaging, microbiology, ancillary and laboratory) are listed below for reference.    Significant Diagnostic Studies: Dg Chest 2  View  Result Date: 09/22/2015 CLINICAL DATA:  Shortness of breath.  Hyperglycemia. EXAM: CHEST  2 VIEW COMPARISON:  09/19/2015 and 09/07/2015 FINDINGS: The heart size is normal. Slight pulmonary vascular prominence. Chronic pleural thickening on the left, unchanged. CABG in previous aortic valve replacement. No acute infiltrates or effusions.  No acute bone abnormality. IMPRESSION: Slight pulmonary vascular prominence.  No other acute change. Electronically Signed   By: Francene Boyers M.D.   On: 09/22/2015 12:14   Ct Chest Wo Contrast  Result Date: 09/29/2015 CLINICAL DATA:  82 y/o M; hypoxia without pain or shortness of breath. EXAM: CT CHEST WITHOUT CONTRAST TECHNIQUE: Multidetector CT imaging of the chest was performed following the standard protocol without IV contrast. COMPARISON:  Chest radiograph dated 09/28/2015. Chest CT dated 07/02/2015. Chest ultrasound dated 07/02/2015. FINDINGS: Cardiovascular: Moderate cardiomegaly. Status post coronary artery bypass graft surgery with saphenous and LIMA grafts. Aortic valve and root replacement. Severe coronary artery calcification. Mitral annular calcification. Enlarged main pulmonary artery measuring 3.8 cm indicating pulmonary artery hypertension. Extensive calcific aortic atherosclerosis of the thoracic aorta. No pericardial effusion. Mediastinum/Nodes: There is mediastinal lymphadenopathy in the subcarinal, right paratracheal, left paratracheal, prevascular, and AP windows that has increased in size from the prior CT for example an AP window node measuring 12 mm, previously 10 mm series 2, image 57 and a precarinal node measuring 17 mm, previously 13 mm series 2, image 63. Lungs/Pleura: There is diffuse interlobular septal thickening, ground-glass opacities, and peribronchial thickening compatible with a moderate pulmonary edema. Right greater than left moderate pleural effusions. Dependent opacities in the lower lobes probably represent atelectasis. Upper  Abdomen: Bilateral adrenal gland hypertrophy without discrete mass. Right kidney nonobstructing stone. Musculoskeletal: Healed median sternotomy. No acute osseous abnormality is identified. IMPRESSION: 1. Moderate pulmonary edema. Dependent opacities in the lower lobes probably represent atelectasis, underlying pneumonia must be excluded clinically. 2. Moderate right pleural effusion new from  prior CT. 3. Stable moderate left pleural effusion with thickened wall and complexity on prior ultrasound likely represents a chronic exudative and possibly loculated effusion. 4. Stable moderate cardiomegaly. 5. Enlarged main pulmonary artery indicating pulmonary artery hypertension without interval change. 6. Thoracic aortic atherosclerosis. 7. Enlarged mediastinal lymph nodes are probably due to chronic pulmonary edema and are increased in size in comparison with the prior CT of the chest as is the degree of pulmonary edema. Reactive changes due to underlying infection is also possible. Electronically Signed   By: Mitzi Hansen M.D.   On: 09/29/2015 04:39   Dg Chest Port 1 View  Result Date: 09/28/2015 CLINICAL DATA:  82 y/o M; shortness of breath and tachycardia tonight. EXAM: PORTABLE CHEST 1 VIEW COMPARISON:  Chest radiograph dated 09/22/2015. FINDINGS: There is blunting of the costophrenic sulci and patchy opacities in the right greater than left lungs which are increased in comparison with the prior chest radiograph. No pneumothorax. Calcific aortic atherosclerosis of the arch. Aortic valve replacement. Post median sternotomy with wires and alignment. No acute osseous abnormality is evident. Stable cardiac silhouette given differences in technique. IMPRESSION: Increased patchy opacities of the lungs, right greater than left, likely represents pulmonary edema. Underlying pneumonia is not excluded. Probable small bilateral pleural effusions. Electronically Signed   By: Mitzi Hansen M.D.   On:  09/28/2015 02:43   Dg Chest Port 1 View  Result Date: 09/19/2015 CLINICAL DATA:  82 year old male with shortness of breath EXAM: PORTABLE CHEST 1 VIEW COMPARISON:  Chest radiograph dated 09/07/2015 FINDINGS: Single portable view of the chest demonstrate diffuse interstitial prominence compatible with congestive changes and mild pulmonary edema. There is a small left pleural effusion. Left lung base atelectatic changes. Pneumonia is not excluded. There is no pneumothorax. Mild cardiomegaly. Median sternotomy wires and cardiac mechanical valve. No acute osseous pathology. IMPRESSION: Interval development of congestive changes with mild interstitial edema and small left pleural effusion. Pneumonia is not excluded. Clinical correlation and follow-up recommended. Electronically Signed   By: Elgie Collard M.D.   On: 09/19/2015 03:29    Microbiology: Recent Results (from the past 240 hour(s))  Blood Culture (routine x 2)     Status: None (Preliminary result)   Collection Time: 09/28/15  4:35 AM  Result Value Ref Range Status   Specimen Description BLOOD LEFT ARM  Final   Special Requests BOTTLES DRAWN AEROBIC ONLY  Final   Culture NO GROWTH 1 DAY  Final   Report Status PENDING  Incomplete  Blood Culture (routine x 2)     Status: None (Preliminary result)   Collection Time: 09/28/15  4:40 AM  Result Value Ref Range Status   Specimen Description BLOOD LEFT HAND  Final   Special Requests IN PEDIATRIC BOTTLE  Final   Culture NO GROWTH 1 DAY  Final   Report Status PENDING  Incomplete  Urine culture     Status: None   Collection Time: 09/28/15  7:40 AM  Result Value Ref Range Status   Specimen Description URINE, RANDOM  Final   Special Requests NONE  Final   Culture NO GROWTH  Final   Report Status 09/29/2015 FINAL  Final  MRSA PCR Screening     Status: Abnormal   Collection Time: 09/28/15  9:42 AM  Result Value Ref Range Status   MRSA by PCR POSITIVE (A) NEGATIVE Final    Comment:         The GeneXpert MRSA Assay (FDA approved for NASAL specimens  only), is one component of a comprehensive MRSA colonization surveillance program. It is not intended to diagnose MRSA infection nor to guide or monitor treatment for MRSA infections. RESULT CALLED TO, READ BACK BY AND VERIFIED WITH: Roselie Skinner. SOECKING RN 11:35 09/28/15 ([email or web address])   Culture, expectorated sputum-assessment     Status: None   Collection Time: 09/29/15 11:58 AM  Result Value Ref Range Status   Specimen Description EXPECTORATED SPUTUM  Final   Special Requests NONE  Final   Sputum evaluation   Final    THIS SPECIMEN IS ACCEPTABLE. RESPIRATORY CULTURE REPORT TO FOLLOW.   Report Status 09/29/2015 FINAL  Final  Culture, respiratory (NON-Expectorated)     Status: None (Preliminary result)   Collection Time: 09/29/15 11:58 AM  Result Value Ref Range Status   Specimen Description SPUTUM  Final   Special Requests NONE  Final   Gram Stain   Final    MODERATE WBC PRESENT, PREDOMINANTLY PMN FEW GRAM POSITIVE COCCI IN CLUSTERS FEW GRAM NEGATIVE RODS    Culture PENDING  Incomplete   Report Status PENDING  Incomplete     Labs: Basic Metabolic Panel:  Recent Labs Lab 09/28/15 0150 09/28/15 1026 09/29/15 0333 09/30/15 0458  NA 135  --  132* 131*  K 4.6  --  4.5 4.2  CL 101  --  98* 95*  CO2 24  --  28 26  GLUCOSE 190*  --  217* 91  BUN 35*  --  34* 31*  CREATININE 1.08  --  1.09 0.97  CALCIUM 9.9  --  9.3 9.3  MG  --  1.5* 1.9  --    Liver Function Tests:  Recent Labs Lab 09/28/15 0150  AST 26  ALT 27  ALKPHOS 86  BILITOT 0.7  PROT 7.2  ALBUMIN 2.5*   No results for input(s): LIPASE, AMYLASE in the last 168 hours. No results for input(s): AMMONIA in the last 168 hours. CBC:  Recent Labs Lab 09/28/15 0150 09/29/15 0333 09/30/15 0458  WBC 26.7* 16.8* 20.1*  NEUTROABS 24.3*  --   --   HGB 10.1* 8.3* 8.9*  HCT 31.8* 27.4* 28.6*  MCV 80.5 82.3 80.6  PLT 343 300 310   Cardiac  Enzymes:  Recent Labs Lab 09/28/15 0150  TROPONINI <0.03   BNP: BNP (last 3 results)  Recent Labs  06/22/15 1400 09/19/15 0314 09/28/15 0221  BNP 168.2* 390.8* 294.3*    ProBNP (last 3 results) No results for input(s): PROBNP in the last 8760 hours.  CBG:  Recent Labs Lab 09/28/15 2256 09/29/15 1143 09/29/15 1620 09/29/15 2018 09/30/15 0551  GLUCAP 173* 309* 173* 116* 89       Signed:  Jeralyn BennettZAMORA, Zamier Eggebrecht MD.  [facility] 09/30/2015, 10:46 AM

## 2015-10-01 LAB — CULTURE, RESPIRATORY W GRAM STAIN: Culture: NORMAL

## 2015-10-01 LAB — CULTURE, RESPIRATORY

## 2015-10-03 LAB — CULTURE, BLOOD (ROUTINE X 2)
Culture: NO GROWTH
Culture: NO GROWTH

## 2015-10-15 DEATH — deceased

## 2017-06-01 IMAGING — CT CT CHEST HIGH RESOLUTION W/O CM
3 of 7 series · 13 of 36 positions shown, 14 images · non-contrast
Comparison: No priors.

CLINICAL DATA: 87-year-old male with worsening shortness of breath
with exertion for the past year. Evaluate for interstitial lung
disease.

EXAM:
CT CHEST WITHOUT CONTRAST
TECHNIQUE: Multidetector CT imaging of the chest was performed following the
standard protocol without intravenous contrast. High resolution
imaging of the lungs, as well as inspiratory and expiratory imaging,
was performed.

[Series 5: lung · axial · 0.73mm/px · z∈[-264,-48]mm · 7 of 59 slices shown]
[im 8/59  lung]
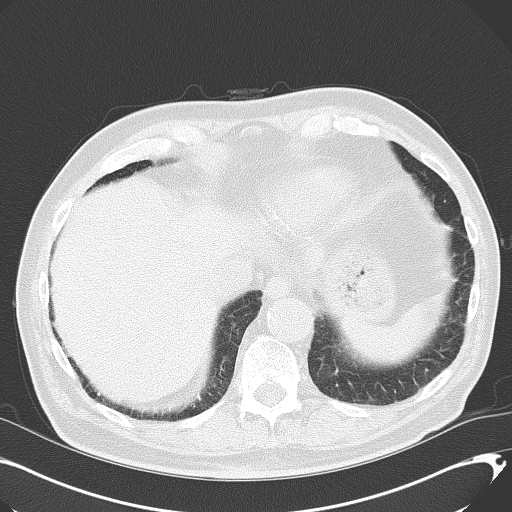
[im 15/59  lung]
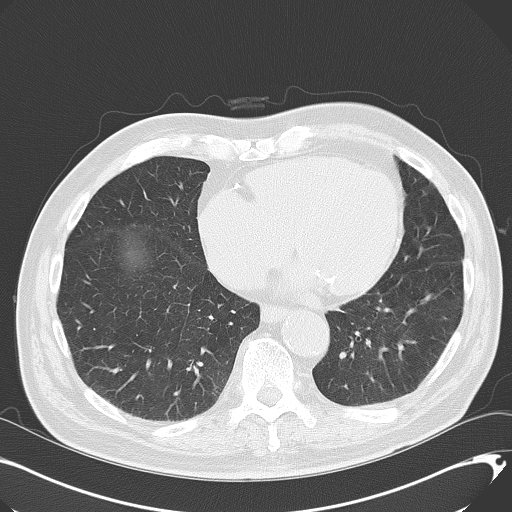
[im 22/59  lung]
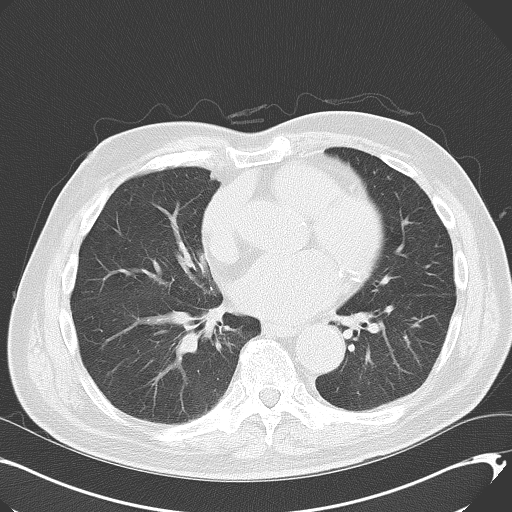
[im 30/59  lung]
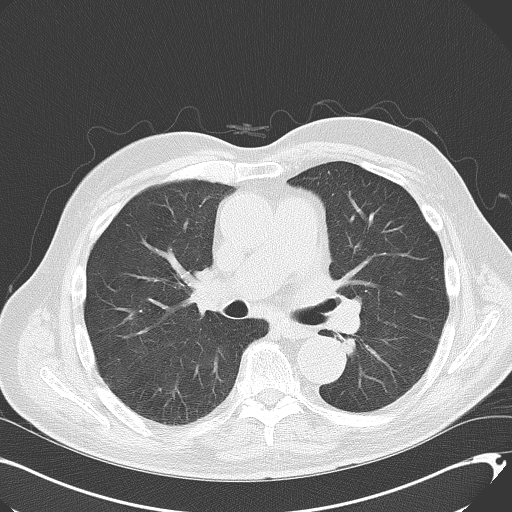
[im 37/59  lung]
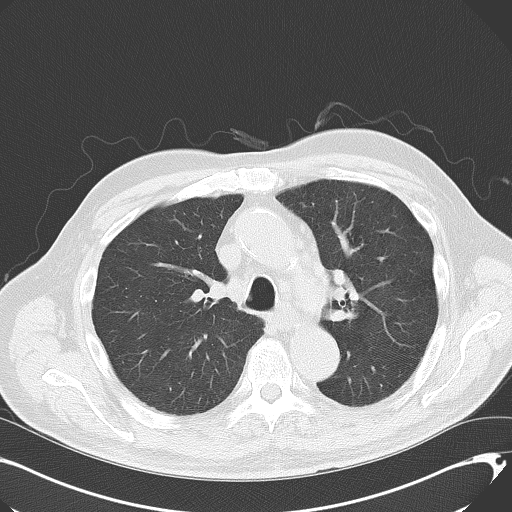
[im 44/59  lung]
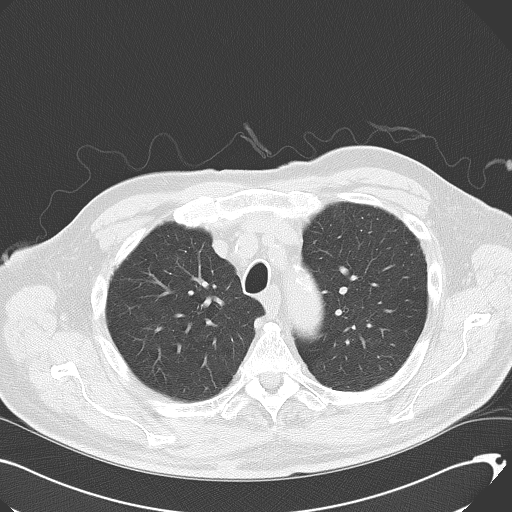
[im 51/59  lung]
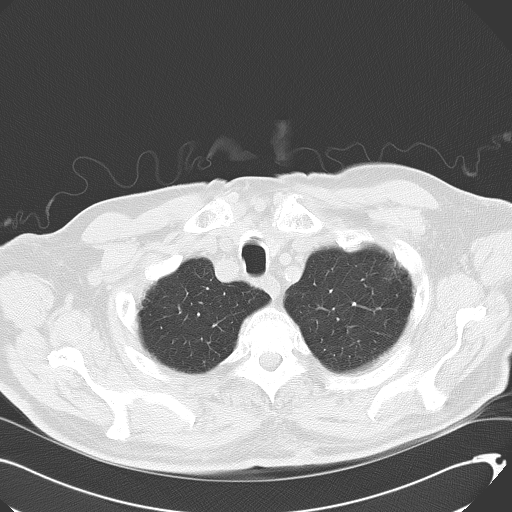

[Series 8: hires retro entire lungs · axial · 0.73mm/px · z∈[-234,-84]mm · 3 of 31 slices shown, 4 images]
[im 8/31  mediastinal]
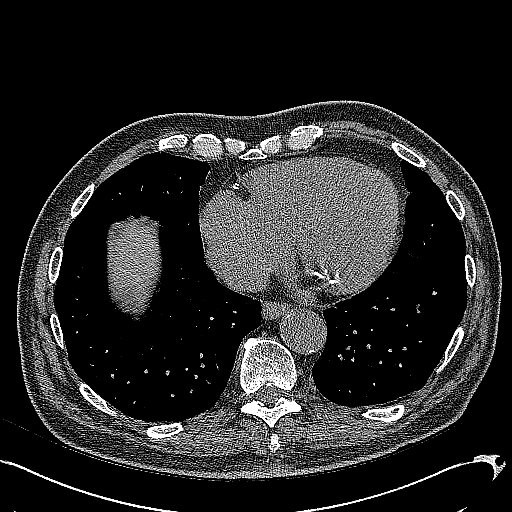
[im 8/31  lung]
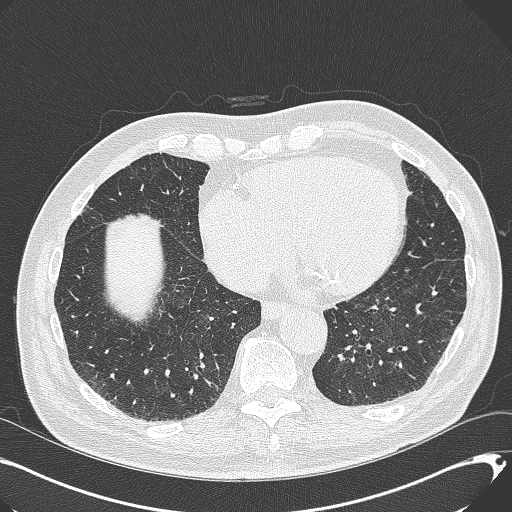
[im 16/31  lung]
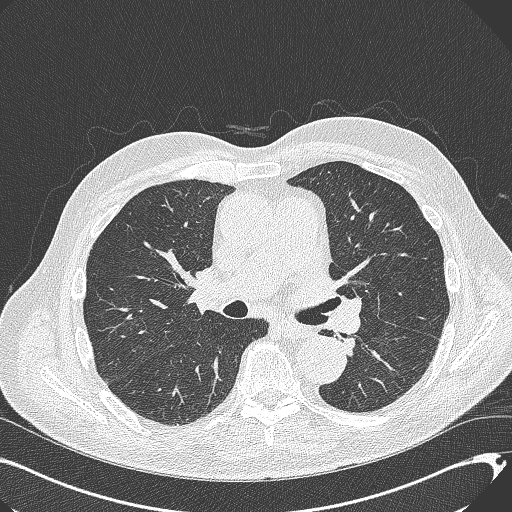
[im 23/31  lung]
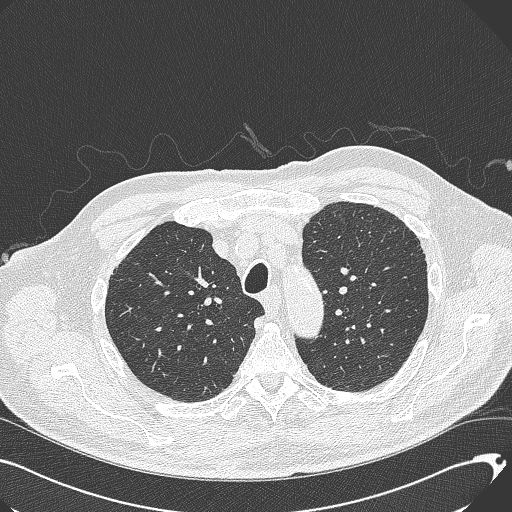

[Series 602: cor · coronal · 0.73mm/px · 3 of 117 slices shown]
[im 24/117  lung]
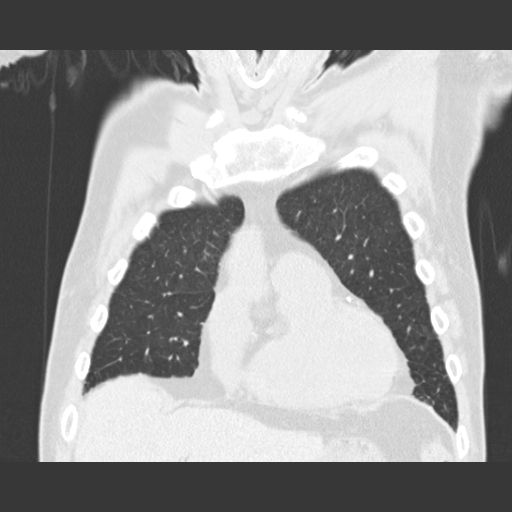
[im 47/117  lung]
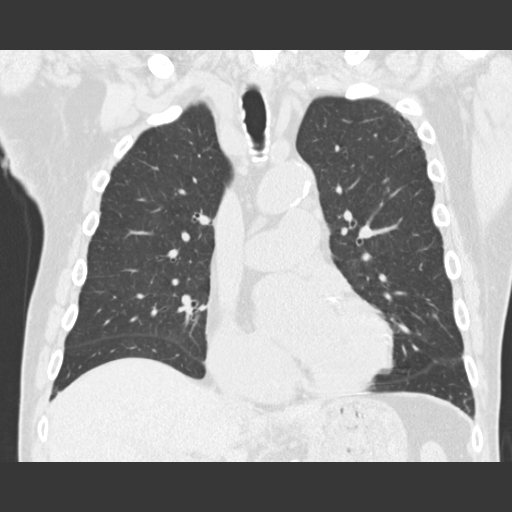
[im 70/117  lung]
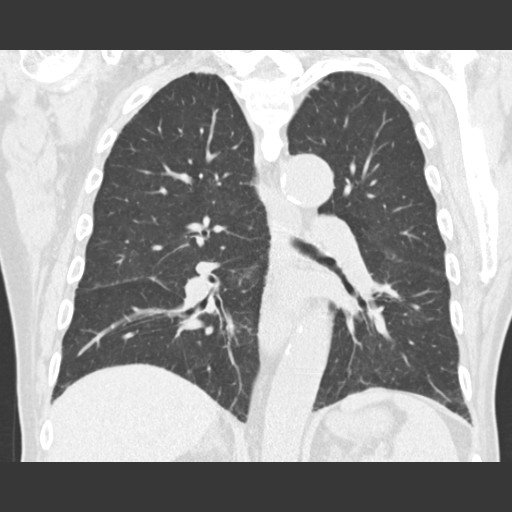

[13 of 36 positions shown; findings below may reference images not displayed]

FINDINGS: Mediastinum/Lymph Nodes: Heart size is borderline enlarged. There is
no significant pericardial fluid, thickening or pericardial
calcification. There is atherosclerosis of the thoracic aorta, the
great vessels of the mediastinum and the coronary arteries,
including calcified atherosclerotic plaque in the left main, left
anterior descending, left circumflex and right coronary arteries.
Severe calcifications of the aortic valve. Calcifications of the
mitral annulus. No pathologically enlarged mediastinal or hilar
lymph nodes. Please note that accurate exclusion of hilar adenopathy
is limited on noncontrast CT scans. Esophagus is unremarkable in
appearance. No axillary lymphadenopathy.

Lungs/Pleura: High-resolution images demonstrate scattered areas of
very mild subpleural reticulation. There is some very mild
parenchymal banding in the lower lobes of the lungs bilaterally. No
associated ground-glass attenuation, traction bronchiectasis or
honeycombing. Inspiratory and expiratory imaging demonstrates some
mild air trapping, indicative of mild small airways disease. Very
mild centrilobular and paraseptal emphysema is noted, predominantly
in the lung apices. No acute consolidative airspace disease. No
pleural effusions. There are few tiny pulmonary nodules,
predominantly subpleural in location, largest of which is associated
with the major fissure in the right lower lobe (image 33 of series
5) measuring only 4 mm.

Upper Abdomen: Calcification in the right adrenal gland, potentially
related to prior hemorrhage or infection. Small calcified granuloma
in the left lobe of the liver. Atherosclerosis.

Musculoskeletal/Soft Tissues: There are no aggressive appearing
lytic or blastic lesions noted in the visualized portions of the
skeleton.
IMPRESSION: 1. There is a pattern of very mild patchy subpleural reticulation in
the lungs bilaterally which is nonspecific, but could be a very
early manifestation of interstitial lung disease. Repeat
high-resolution chest CT is recommended in 12 months to assess for
further temporal changes in the appearance of the lung parenchyma if
clinically appropriate.
2. Tiny sub cm pulmonary nodules in the lungs, largest of which is
subpleural in the right lower lobe (image 33 of series 5). These are
nonspecific. Attention at time of repeat high-resolution chest CT is
recommended to ensure the stability or resolution of these findings.
3. Mild centrilobular and paraseptal emphysema.
4. Atherosclerosis, including left main and 3 vessel coronary artery
disease.
5. There are calcifications of the aortic valve and mitral valve.
Echocardiographic correlation for evaluation of potential valvular
dysfunction may be warranted if clinically indicated.
6. Mild cardiomegaly.
7. Additional incidental findings, as above.
